# Patient Record
Sex: Male | Born: 1955 | Race: White | Hispanic: No | Marital: Married | Smoking: Former smoker
Health system: Southern US, Community
[De-identification: ages and names within clinical notes are randomized; demographics above are authoritative.]

## PROBLEM LIST (undated history)

## (undated) DIAGNOSIS — I639 Cerebral infarction, unspecified: Secondary | ICD-10-CM

## (undated) DIAGNOSIS — T8859XA Other complications of anesthesia, initial encounter: Secondary | ICD-10-CM

## (undated) DIAGNOSIS — Z9889 Other specified postprocedural states: Secondary | ICD-10-CM

## (undated) DIAGNOSIS — J9610 Chronic respiratory failure, unspecified whether with hypoxia or hypercapnia: Secondary | ICD-10-CM

## (undated) DIAGNOSIS — E119 Type 2 diabetes mellitus without complications: Secondary | ICD-10-CM

## (undated) DIAGNOSIS — I10 Essential (primary) hypertension: Secondary | ICD-10-CM

## (undated) DIAGNOSIS — R7881 Bacteremia: Secondary | ICD-10-CM

## (undated) DIAGNOSIS — L988 Other specified disorders of the skin and subcutaneous tissue: Secondary | ICD-10-CM

## (undated) DIAGNOSIS — I82890 Acute embolism and thrombosis of other specified veins: Secondary | ICD-10-CM

## (undated) DIAGNOSIS — R112 Nausea with vomiting, unspecified: Secondary | ICD-10-CM

## (undated) DIAGNOSIS — C801 Malignant (primary) neoplasm, unspecified: Secondary | ICD-10-CM

## (undated) DIAGNOSIS — T4145XA Adverse effect of unspecified anesthetic, initial encounter: Secondary | ICD-10-CM

## (undated) HISTORY — PX: OTHER SURGICAL HISTORY: SHX169

## (undated) HISTORY — PX: CERVICAL DISC SURGERY: SHX588

## (undated) HISTORY — DX: Essential (primary) hypertension: I10

## (undated) HISTORY — PX: CHOLECYSTECTOMY: SHX55

## (undated) HISTORY — DX: Type 2 diabetes mellitus without complications: E11.9

## (undated) HISTORY — PX: APPENDECTOMY: SHX54

## (undated) HISTORY — PX: KNEE ARTHROSCOPY: SUR90

---
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
## 2004-11-21 ENCOUNTER — Ambulatory Visit: Payer: Self-pay | Admitting: Internal Medicine

## 2005-08-13 ENCOUNTER — Ambulatory Visit (HOSPITAL_COMMUNITY): Admission: RE | Admit: 2005-08-13 | Discharge: 2005-08-14 | Payer: Self-pay | Admitting: Neurosurgery

## 2009-11-08 ENCOUNTER — Encounter (INDEPENDENT_AMBULATORY_CARE_PROVIDER_SITE_OTHER): Payer: Self-pay | Admitting: Neurosurgery

## 2009-11-08 ENCOUNTER — Ambulatory Visit (HOSPITAL_COMMUNITY): Admission: RE | Admit: 2009-11-08 | Discharge: 2009-11-08 | Payer: Self-pay | Admitting: Neurosurgery

## 2010-06-20 LAB — GLUCOSE, CAPILLARY: Glucose-Capillary: 188 mg/dL — ABNORMAL HIGH (ref 70–99)

## 2010-06-21 LAB — CBC
Hemoglobin: 15.6 g/dL (ref 13.0–17.0)
MCH: 28.6 pg (ref 26.0–34.0)
MCV: 83.7 fL (ref 78.0–100.0)
RBC: 5.47 MIL/uL (ref 4.22–5.81)
RDW: 12.6 % (ref 11.5–15.5)

## 2010-06-21 LAB — BASIC METABOLIC PANEL
Chloride: 97 mEq/L (ref 96–112)
GFR calc Af Amer: >60 mL/min (ref >60)
Glucose, Bld: 351 mg/dL — ABNORMAL HIGH (ref 70–99)
Potassium: 4.3 mEq/L (ref 3.5–5.1)
Sodium: 132 mEq/L — ABNORMAL LOW (ref 135–145)

## 2010-08-22 NOTE — Op Note (Signed)
[name], [name]            ACCOUNT NO.:  [id number]   MEDICAL RECORD NO.:  [id number]          PATIENT TYPE:  OIB   LOCATION:  3017                         FACILITY:  [facility]   PHYSICIAN:  Danae Orleans. Venetia Maxon, M.D.  DATE OF BIRTH:  1955-08-19   DATE OF PROCEDURE:  08/13/2005  DATE OF DISCHARGE:  08/14/2005                                 OPERATIVE REPORT   PREOPERATIVE DIAGNOSIS:  Herniated cervical disk with spondylosis, kyphosis,  degenerative disk disease and radiculopathy, C4-5 and C5-6 levels.   POSTOPERATIVE DIAGNOSIS:  Herniated cervical disk with spondylosis,  kyphosis, degenerative disk disease and radiculopathy, C4-5 and C5-6 levels.   OPERATION PERFORMED:  Anterior cervical decompression and fusion C4-5 and C5-  6 level with PEEK interbody cages, morselized bone autograft and anterior  cervical plate.   SURGEON:  Danae Orleans. Venetia Maxon, M.D.   ASSISTANT:  Stefani Dama, M.D.   ANESTHESIA:  General endotracheal.   ESTIMATED BLOOD LOSS:  Minimal.   COMPLICATIONS:  None.   DISPOSITION:  Recovery.   INDICATIONS FOR PROCEDURE:  Nathan Perkins is a 55 year old with a  herniated disk herniations and cervical spondylosis  with kyphosis at C4-5  and C5-6 levels and significant cervical radiculopathy.  It was elected to  take him to surgery for anterior cervical decompression and fusion at the C4-  5 and C5-6 level.   DESCRIPTION OF PROCEDURE:  Mr. Seiden was brought to the operating room.  Following satisfactory and uncomplicated induction of general endotracheal  anesthesia and placement of intravenous lines, the patient was placed in  supine position on the operating table.  The neck was placed in slight  extension and he was placed in 10 pounds of halter traction.  The anterior  neck was then prepped and draped in the usual sterile fashion.  The area of  planned incision was infiltrated with 0.25% Marcaine and 0.5% lidocaine  1:200,000 epinephrine.  Incision was  made from the midline to the anterior  border of the sternocleidomastoid muscle, carried sharply through the  platysma layer.  Subplatysmal dissection was performed exposing the anterior  border of the sternocleidomastoid muscle. Using blunt dissection, the  carotid sheath was kept lateral, the trachea and esophagus kept medial  exposing the anterior cervical spine.  A bent spinal needle was placed at  what was felt to be the C4-5 level.  Intraoperative x-ray confirmed this to  be the C4-5 level.  Subsequently, the longus colli muscles were taken down  from the anterior cervical spine from C4 through C6 bilaterally using  electrocautery and Key elevator.  Self-retaining Shadowline retractor was  placed to facilitate exposure.  Anterior bone spurs were then removed.  Interspaces at C4-5 and C5-6 were then incised with a 15 blade and disk  material was removed in piecemeal fashion.  End plates were stripped of  residual disk material and cartilaginous material using a variety of Carlens  curettes.  A disk space spreader was placed at the C4-5 level and the  microscope was brought into the field and using high powered microscopic  visualization the end plates were decorticated and the bone  spurs and also  uncinate spurs which were drilled down were retained and then later used in  bone grafting.  The spinal cord dura was decompressed.  The posterior  longitudinal ligament was removed in piecemeal fashion.  Significant  spondylitic narrowing at the C4-5 level on the left was removed with  resultant significant decompression of the nerve roots.  Hemostasis was  assured with Gelfoam soaked in Thrombin.  After trial sizing, a 7 mm  PEEK  interbody cage was selected, packed with morcellized bone autograft and  inserted in the interspace and countersunk appropriately.  Attention was  then turned to the C5-6 level where similar decompression was performed and  again uncinate spurs were drilled down  and retained for later use in bone  grafting and both the cervical spinal cord dura and both the C6 nerve roots  were widely decompressed.  Hemostasis was again assured and a similarly  sized graft was packed with morcellized bone autograft and inserted in the  interspace and countersunk appropriately.  The anterior cervical plate was  then affixed to the anterior cervical spine using 14 mm variable angle  screws, two at C4, two at C5, two at C6.  All screws had excellent purchase.  A 34 mm anterior cervical plate was utilized.  Hemostasis was assured.  Final X-ray confirmed well positioned interbody grafts and anterior cervical  plate.  At C4 and 5 the lower portion was not well visualized.  Halter  traction was removed prior to placing the plate and bone graft.  Subsequently the platysma layer was closed with 3-0 Vicryl sutures and skin  edges were reapproximated with 3-0 Vicryl interrupted inverted sutures.  The  wound was dressed with Dermabond.  The patient was extubated in the  operating room and taken to the recovery room in stable and satisfactory  condition having tolerated the operation well.  Counts were correct at the  end of the case.      Danae Orleans. Venetia Maxon, M.D.  Electronically Signed     JDS/MEDQ  D:  08/13/2005  T:  08/14/2005  Job:  [id number]

## 2013-01-12 ENCOUNTER — Encounter (INDEPENDENT_AMBULATORY_CARE_PROVIDER_SITE_OTHER): Payer: Self-pay | Admitting: *Deleted

## 2013-02-16 ENCOUNTER — Encounter (INDEPENDENT_AMBULATORY_CARE_PROVIDER_SITE_OTHER): Payer: Self-pay | Admitting: Internal Medicine

## 2013-02-16 ENCOUNTER — Other Ambulatory Visit (INDEPENDENT_AMBULATORY_CARE_PROVIDER_SITE_OTHER): Payer: Self-pay | Admitting: *Deleted

## 2013-02-16 ENCOUNTER — Telehealth (INDEPENDENT_AMBULATORY_CARE_PROVIDER_SITE_OTHER): Payer: Self-pay | Admitting: *Deleted

## 2013-02-16 ENCOUNTER — Ambulatory Visit (INDEPENDENT_AMBULATORY_CARE_PROVIDER_SITE_OTHER): Payer: BC Managed Care – PPO | Admitting: Internal Medicine

## 2013-02-16 VITALS — BP 118/82 | HR 80 | Temp 97.7°F | Ht 72.0 in | Wt 202.1 lb

## 2013-02-16 DIAGNOSIS — R195 Other fecal abnormalities: Secondary | ICD-10-CM | POA: Insufficient documentation

## 2013-02-16 DIAGNOSIS — K921 Melena: Secondary | ICD-10-CM

## 2013-02-16 MED ORDER — PEG-KCL-NACL-NASULF-NA ASC-C 100 G PO SOLR: 1.0000 | Freq: Once | ORAL | Status: DC

## 2013-02-16 NOTE — Patient Instructions (Signed)
Colonoscopy with Dr. Rehman. The risks and benefits such as perforation, bleeding, and infection were reviewed with the patient and is agreeable. 

## 2013-02-16 NOTE — Progress Notes (Addendum)
Subjective:     Patient ID: Nathan Perkins, male   DOB: September 26, 1955, 57 y.o.   MRN: [id number]  HPI Referred to our office by Dr. Sherryll Burger for heme positive stools card/colonoscopy. His last colonoscopy was 6 yrs ago for screening purposes. He reports it was normal. [facility] by Dr. Karilyn Cota. Appetite is good. No weight loss. No acid reflux with Omeprazole. No abdominal pain. He usually has a BM daily. No melena or bright red rectal bleeding. No NSAIDs. Does take an ASA 81mg  daily.   12/09/2012 H and h 15.6 AND 44.6 Married, retired. One adopted child in good health.   Review of Systems Current Outpatient Prescriptions  Medication Sig Dispense Refill  . aspirin 81 MG tablet Take 81 mg by mouth daily.      . cholecalciferol (VITAMIN D) 400 UNITS TABS tablet Take 400 Units by mouth.      . Cinnamon 500 MG capsule Take 500 mg by mouth 2 (two) times daily before a meal.      . Flaxseed, Linseed, (FLAXSEED OIL PO) Take by mouth.      . insulin detemir (LEVEMIR) 100 UNIT/ML injection Inject into the skin. 25 in am and 60 at night      . insulin lispro (HUMALOG) 100 UNIT/ML injection Inject into the skin 3 (three) times daily before meals.      [name] Kitchen lisinopril (PRINIVIL,ZESTRIL) 10 MG tablet Take 10 mg by mouth daily.      . metFORMIN (GLUMETZA) 500 MG (MOD) 24 hr tablet Take 500 mg by mouth daily with breakfast.      . omeprazole (PRILOSEC) 20 MG capsule Take 20 mg by mouth 2 (two) times daily before a meal.       No current facility-administered medications for this visit.   Past Medical History  Diagnosis Date  . Diabetes     type 2 x 5 yrs  . Hypertension     since age 87   Past Surgical History  Procedure Laterality Date  . Neck surgery      with a plate  . Appendectomy    . Knee arthroscopy    . Cyst removal of neck    . Cholecystectomy     Past Surgical History  Procedure Laterality Date  . Neck surgery      with a plate  . Appendectomy    . Knee arthroscopy    . Cyst removal of  neck    . Cholecystectomy         Objective:   Physical Exam  Filed Vitals:   02/16/13 1527  BP: 118/82  Pulse: 80  Temp: 97.7 F (36.5 C)  Height: 6' (1.829 m)  Weight: 202 lb 1.6 oz (91.672 kg)   Alert and oriented. Skin warm and dry. Oral mucosa is moist.   . Sclera anicteric, conjunctivae is pink. Thyroid not enlarged. No cervical lymphadenopathy. Lungs clear. Heart regular rate and rhythm.  Abdomen is soft. Bowel sounds are positive. No hepatomegaly. No abdominal masses felt. No tenderness.  No edema to lower extremities.       Assessment:    Heme positive card. Colonic neoplasm needs to be ruled out. Hemorroids, polyps also in the differential.     Plan:       Colonoscopy with Dr Karilyn Cota. The risks and benefits such as perforation, bleeding, and infection were reviewed with the patient and is agreeable.

## 2013-02-16 NOTE — Telephone Encounter (Signed)
Patient needs movi prep 

## 2013-03-07 ENCOUNTER — Encounter (HOSPITAL_COMMUNITY): Payer: Self-pay | Admitting: Pharmacy Technician

## 2013-03-15 ENCOUNTER — Encounter (HOSPITAL_COMMUNITY): Payer: Self-pay | Admitting: *Deleted

## 2013-03-15 ENCOUNTER — Encounter (HOSPITAL_COMMUNITY)
Admission: RE | Disposition: A | Discharge status: Home / Self Care | Payer: Self-pay | Source: Ambulatory Visit | Attending: Internal Medicine

## 2013-03-15 ENCOUNTER — Ambulatory Visit (HOSPITAL_COMMUNITY)
Admission: RE | Admit: 2013-03-15 | Discharge: 2013-03-15 | Disposition: A | Discharge status: Home / Self Care | Payer: BC Managed Care – PPO | Source: Ambulatory Visit | Attending: Internal Medicine | Admitting: Internal Medicine

## 2013-03-15 DIAGNOSIS — Z7982 Long term (current) use of aspirin: Secondary | ICD-10-CM

## 2013-03-15 DIAGNOSIS — I10 Essential (primary) hypertension: Secondary | ICD-10-CM | POA: Insufficient documentation

## 2013-03-15 DIAGNOSIS — R195 Other fecal abnormalities: Secondary | ICD-10-CM

## 2013-03-15 DIAGNOSIS — Z01812 Encounter for preprocedural laboratory examination: Secondary | ICD-10-CM | POA: Insufficient documentation

## 2013-03-15 DIAGNOSIS — K921 Melena: Secondary | ICD-10-CM | POA: Insufficient documentation

## 2013-03-15 DIAGNOSIS — E119 Type 2 diabetes mellitus without complications: Secondary | ICD-10-CM | POA: Insufficient documentation

## 2013-03-15 DIAGNOSIS — K644 Residual hemorrhoidal skin tags: Secondary | ICD-10-CM | POA: Insufficient documentation

## 2013-03-15 HISTORY — PX: COLONOSCOPY: SHX5424

## 2013-03-15 SURGERY — COLONOSCOPY
Anesthesia: Moderate Sedation

## 2013-03-15 MED ORDER — MEPERIDINE HCL 50 MG/ML IJ SOLN
INTRAMUSCULAR | Status: DC | PRN
  Administered 2013-03-15 (×2): 25 mg via INTRAVENOUS

## 2013-03-15 MED ORDER — MIDAZOLAM HCL 5 MG/5ML IJ SOLN
INTRAMUSCULAR | Status: AC
  Filled 2013-03-15: qty 10

## 2013-03-15 MED ORDER — MIDAZOLAM HCL 5 MG/5ML IJ SOLN
INTRAMUSCULAR | Status: DC | PRN
  Administered 2013-03-15 (×2): 2 mg via INTRAVENOUS

## 2013-03-15 MED ORDER — MEPERIDINE HCL 50 MG/ML IJ SOLN
INTRAMUSCULAR | Status: AC
  Filled 2013-03-15: qty 1

## 2013-03-15 MED ORDER — STERILE WATER FOR IRRIGATION IR SOLN
Status: DC | PRN
  Administered 2013-03-15: 15:00:00

## 2013-03-15 MED ORDER — SODIUM CHLORIDE 0.9 % IV SOLN
INTRAVENOUS | Status: DC
  Administered 2013-03-15: 14:00:00 via INTRAVENOUS

## 2013-03-15 NOTE — H&P (Addendum)
Nathan Perkins is an 57 y.o. male.   Chief Complaint: Patient is here for colonoscopy. HPI: This is 37 old Caucasian male who was found to have heme-positive stools on routine testing by his primary care physician Dr. Sherryll Burger. He denies abdominal pain change in his bowel habits rectal bleeding or melena. He is on low-dose aspirin. He does not take OTC NSAIDs. Patient's last colonoscopy was over 6 years ago. Family history is negative for CRC.  Past Medical History  Diagnosis Date  . Diabetes     type 2 x 5 yrs  . Hypertension     since age 64    Past Surgical History  Procedure Laterality Date  . Neck surgery      with a plate  . Appendectomy    . Knee arthroscopy    . Cyst removal of neck    . Cholecystectomy      History reviewed. No pertinent family history. Social History:  reports that he has quit smoking. He does not have any smokeless tobacco history on file. He reports that he does not drink alcohol or use illicit drugs.  Allergies:  Allergies  Allergen Reactions  . Shellfish Allergy   . Sulfa Antibiotics     He cannot tell me what kind of reaction he had    Medications Prior to Admission  Medication Sig Dispense Refill  . aspirin 81 MG tablet Take 81 mg by mouth daily.      . cholecalciferol (VITAMIN D) 400 UNITS TABS tablet Take 400 Units by mouth.      . Cinnamon 500 MG capsule Take 500 mg by mouth 2 (two) times daily before a meal.      . Flaxseed, Linseed, (FLAXSEED OIL PO) Take 1 tablet by mouth daily.       . insulin detemir (LEVEMIR) 100 UNIT/ML injection Inject into the skin. 25 in am and 60 at night      . insulin lispro (HUMALOG) 100 UNIT/ML injection Inject 3-14 Units into the skin 3 (three) times daily before meals. Based on sliding scale.      [name] Kitchen lisinopril (PRINIVIL,ZESTRIL) 10 MG tablet Take 10 mg by mouth daily.      . metFORMIN (GLUMETZA) 500 MG (MOD) 24 hr tablet Take 500 mg by mouth daily with breakfast.      . omeprazole (PRILOSEC) 20 MG  capsule Take 20 mg by mouth 2 (two) times daily before a meal.        No results found for this or any previous visit (from the past 48 hour(s)). No results found.  ROS  Blood pressure 154/111, pulse 88, temperature 97.9 F (36.6 C), temperature source Oral, resp. rate 18, height 6' (1.829 m), weight 202 lb (91.627 kg), SpO2 98.00%. Physical Exam  Constitutional: He appears well-developed and well-nourished.  HENT:  Mouth/Throat: Oropharynx is clear and moist.  Eyes: Conjunctivae are normal. No scleral icterus.  Neck: No thyromegaly present.  Cardiovascular: Normal rate, regular rhythm and normal heart sounds.   No murmur heard. Respiratory: Effort normal and breath sounds normal.  GI: Soft. He exhibits no distension and no mass. There is no tenderness.  Small umbilical hernia  Musculoskeletal: He exhibits no edema.  Neurological: He is alert.  Skin: Skin is warm and dry.     Assessment/Plan Heme-positive stool. Diagnostic colonoscopy.  Nathan Perkins 03/15/2013, 3:04 PM

## 2013-03-15 NOTE — OR Nursing (Signed)
Dr. Karilyn Cota notified of patient's BP 154/111, P-88. No orders received.

## 2013-03-15 NOTE — Op Note (Signed)
COLONOSCOPY PROCEDURE REPORT  PATIENT:  Nathan Perkins  MR#:  [id number] Birthdate:  [birth year], 57 y.o., male Endoscopist:  Dr. Malissa Hippo, MD Referred By:  Dr. Kirstie Peri, MD  Procedure Date: 03/15/2013  Procedure:   Colonoscopy  Indications: Patient is 2 old Caucasian male who was found to have heme positive stool on routine testing. He has no GI symptoms. He is on low-dose aspirin. His last colonoscopy was over 6 years ago.  Informed Consent:  The procedure and risks were reviewed with the patient and informed consent was obtained.  Medications:  Demerol 50 mg IV Versed 4 mg IV  Description of procedure:  After a digital rectal exam was performed, that colonoscope was advanced from the anus through the rectum and colon to the area of the cecum, ileocecal valve and appendiceal orifice. The cecum was deeply intubated. These structures were well-seen and photographed for the record. From the level of the cecum and ileocecal valve, the scope was slowly and cautiously withdrawn. The mucosal surfaces were carefully surveyed utilizing scope tip to flexion to facilitate fold flattening as needed. The scope was pulled down into the rectum where a thorough exam including retroflexion was performed. Terminal ileum was also examined.  Findings:   Prep excellent. Normal mucosa of terminal ileum. Normal mucosa of various segments of colon and rectum. Small hemorrhoids below the dentate line.    Therapeutic/Diagnostic Maneuvers Performed:   None  Complications:  None  Cecal Withdrawal Time:  10 minutes  Impression:  Normal mucosa of terminal ileum. Normal colonoscopy except small external hemorrhoids.  Recommendations:  Standard instructions given. He will have CBC and Hemoccults repeated in 4 months Dr. Sherryll Burger. Next screening exam in 10 years.  Zimal Weisensel U  03/15/2013 3:35 PM  CC: Dr. Kirstie Peri, MD & Dr. Bonnetta Barry ref. provider found

## 2013-03-16 ENCOUNTER — Encounter (INDEPENDENT_AMBULATORY_CARE_PROVIDER_SITE_OTHER): Payer: Self-pay

## 2013-03-16 LAB — GLUCOSE, CAPILLARY: Glucose-Capillary: 175 mg/dL — ABNORMAL HIGH (ref 70–99)

## 2013-03-20 ENCOUNTER — Encounter (HOSPITAL_COMMUNITY): Payer: Self-pay | Admitting: Internal Medicine

## 2014-03-13 ENCOUNTER — Other Ambulatory Visit: Payer: Self-pay | Admitting: Dermatology

## 2014-05-10 ENCOUNTER — Other Ambulatory Visit: Payer: Self-pay | Admitting: Dermatology

## 2015-01-03 ENCOUNTER — Encounter: Payer: Self-pay | Admitting: Cardiovascular Disease

## 2015-01-03 ENCOUNTER — Ambulatory Visit (INDEPENDENT_AMBULATORY_CARE_PROVIDER_SITE_OTHER): Payer: BLUE CROSS/BLUE SHIELD | Admitting: Cardiovascular Disease

## 2015-01-03 ENCOUNTER — Other Ambulatory Visit: Payer: Self-pay | Admitting: Cardiovascular Disease

## 2015-01-03 VITALS — BP 138/88 | HR 90

## 2015-01-03 DIAGNOSIS — R079 Chest pain, unspecified: Secondary | ICD-10-CM | POA: Diagnosis not present

## 2015-01-03 DIAGNOSIS — Z87898 Personal history of other specified conditions: Secondary | ICD-10-CM

## 2015-01-03 DIAGNOSIS — R931 Abnormal findings on diagnostic imaging of heart and coronary circulation: Secondary | ICD-10-CM

## 2015-01-03 DIAGNOSIS — R9431 Abnormal electrocardiogram [ECG] [EKG]: Secondary | ICD-10-CM

## 2015-01-03 DIAGNOSIS — R9439 Abnormal result of other cardiovascular function study: Secondary | ICD-10-CM

## 2015-01-03 DIAGNOSIS — R0609 Other forms of dyspnea: Secondary | ICD-10-CM

## 2015-01-03 DIAGNOSIS — Z9289 Personal history of other medical treatment: Secondary | ICD-10-CM

## 2015-01-03 NOTE — Patient Instructions (Signed)
Your physician recommends that you schedule a follow-up appointment in: [facility]     Your physician has requested that you have a cardiac catheterization. Cardiac catheterization is used to diagnose and/or treat various heart conditions. Doctors may recommend this procedure for a number of different reasons. The most common reason is to evaluate chest pain. Chest pain can be a symptom of coronary artery disease (CAD), and cardiac catheterization can show whether plaque is narrowing or blocking your heart's arteries. This procedure is also used to evaluate the valves, as well as measure the blood flow and oxygen levels in different parts of your heart. For further information please visit [email or web address]. Please follow instruction sheet, as given.  HOLD Insulin and Metformin as directed on heart catherization instructions      Thank you for choosing [facility] !

## 2015-01-03 NOTE — Progress Notes (Signed)
Patient ID: Nathan Perkins, male   DOB: February 06, 1956, 59 y.o.   MRN: [id number]       CARDIOLOGY CONSULT NOTE  Patient ID: Nathan Perkins MRN: [id number] DOB/AGE: 13-Sep-1955 59 y.o.  Admit date: (Not on file) Primary Physician Monico Blitz, MD  Reason for Consultation: abnormal stress test  HPI: The patient is a 59 year old male with a history of diabetes mellitus and essential hypertension who is referred today for an abnormal cardiac stress test.   He underwent an exercise Cardiolite stress test on 12/28/14. It appears he exercised for 7 minutes and 6 seconds utilizing a standard Bruce protocol achieving stage III and 10.1 METS. The ST segment response to stress was reportedly normal. There were no documented arrhythmias. It was interpreted as an abnormal study with a small stress induced septal defect with normal left ventricular systolic function, LVEF 17%.  He tells me he was hospitalized at [facility] for chest pain and he ruled out for an acute coronary syndrome. He had woken up early in the morning with severe retrosternal chest pain. He has also been experiencing exertional dyspnea when climbing a flight of stairs which he did not experience 3 months ago. His wife is present and is very concerned. He does have gastroesophageal reflux disease and had been on Nexium and now takes omeprazole due to insurance coverage reasons. He denies orthopnea, paroxysmal nocturnal dyspnea, and leg swelling.   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 He said he needs to have this issue resolved in order to have his license renewed this upcoming March.  ECG performed in the office today demonstrates normal sinus rhythm with no ischemic ST segment or T-wave abnormalities, nor any arrhythmias.  Fam: His father had a pacemaker and his brother who is one year younger than him had CABG and is also being scheduled for pacemaker.   Allergies  Allergen Reactions  .  Shellfish Allergy   . Sulfa Antibiotics     He cannot tell me what kind of reaction he had    Current Outpatient Prescriptions  Medication Sig Dispense Refill  . aspirin 81 MG tablet Take 81 mg by mouth daily.    . cholecalciferol (VITAMIN D) 400 UNITS TABS tablet Take 400 Units by mouth.    . Cinnamon 500 MG capsule Take 500 mg by mouth 2 (two) times daily before a meal.    . Flaxseed, Linseed, (FLAXSEED OIL PO) Take 1 tablet by mouth daily.     . insulin lispro (HUMALOG) 100 UNIT/ML injection Inject 3-14 Units into the skin 3 (three) times daily before meals. Based on sliding scale.    . metFORMIN (GLUMETZA) 500 MG (MOD) 24 hr tablet Take 500 mg by mouth daily with breakfast.    . NOVOLIN R RELION 100 UNIT/ML injection Inject into the skin. Sliding scale  0  . omeprazole (PRILOSEC) 20 MG capsule Take 20 mg by mouth 2 (two) times daily before a meal.    . lisinopril (PRINIVIL,ZESTRIL) 20 MG tablet Take 30 mg by mouth daily.   1   No current facility-administered medications for this visit.    Past Medical History  Diagnosis Date  . Diabetes     type 2 x 5 yrs  . Hypertension     since age 57    Past Surgical History  Procedure Laterality Date  . Neck surgery      with a plate  . Appendectomy    .  Knee arthroscopy    . Cyst removal of neck    . Cholecystectomy    . Colonoscopy N/A 03/15/2013    Procedure: COLONOSCOPY;  Surgeon: Rogene Houston, MD;  Location: [facility];  Service: Endoscopy;  Laterality: N/A;  67    Social History   Social History  . Marital Status: Married    Spouse Name: N/A  . Number of Children: N/A  . Years of Education: N/A   Occupational History  . Not on file.   Social History Main Topics  . Smoking status: Former Smoker -- 2.00 packs/day    Types: Cigarettes    Start date: 01/02/1969    Quit date: 07/03/1999  . Smokeless tobacco: Former Systems developer    Types: Chew     Comment: quit 13 yrs ago  . Alcohol Use: No  . Drug Use: No  .  Sexual Activity: Not on file   Other Topics Concern  . Not on file   Social History Narrative       Prior to Admission medications   Medication Sig Start Date End Date Taking? Authorizing Provider  aspirin 81 MG tablet Take 81 mg by mouth daily.    Historical Provider, MD  cholecalciferol (VITAMIN D) 400 UNITS TABS tablet Take 400 Units by mouth.    Historical Provider, MD  Cinnamon 500 MG capsule Take 500 mg by mouth 2 (two) times daily before a meal.    Historical Provider, MD  Flaxseed, Linseed, (FLAXSEED OIL PO) Take 1 tablet by mouth daily.     Historical Provider, MD  insulin lispro (HUMALOG) 100 UNIT/ML injection Inject 3-14 Units into the skin 3 (three) times daily before meals. Based on sliding scale.    Historical Provider, MD  lisinopril (PRINIVIL,ZESTRIL) 10 MG tablet Take 10 mg by mouth daily.    Historical Provider, MD  metFORMIN (GLUMETZA) 500 MG (MOD) 24 hr tablet Take 500 mg by mouth daily with breakfast.    Historical Provider, MD  omeprazole (PRILOSEC) 20 MG capsule Take 20 mg by mouth 2 (two) times daily before a meal.    Historical Provider, MD     Review of systems complete and found to be negative unless listed above in HPI     Physical exam Blood pressure 138/88, pulse 90, SpO2 98 %. General: NAD Neck: No JVD, no thyromegaly or thyroid nodule.  Lungs: Clear to auscultation bilaterally with normal respiratory effort. CV: Nondisplaced PMI. Regular rate and rhythm, normal S1/S2, no S3/S4, no murmur.  No peripheral edema.  No carotid bruit.  Normal pedal pulses.  Abdomen: Soft, nontender, no hepatosplenomegaly, no distention.  Skin: Intact without lesions or rashes.  Neurologic: Alert and oriented x 3.  Psych: Normal affect. Extremities: No clubbing or cyanosis.  HEENT: Normal.   ECG: Most recent ECG reviewed.  Labs:   Lab Results  Component Value Date   WBC 8.0 11/01/2009   HGB 15.6 11/01/2009   HCT 45.8 11/01/2009   MCV 83.7 11/01/2009   PLT  233 11/01/2009   No results for input(s): NA, K, CL, CO2, BUN, CREATININE, CALCIUM, PROT, BILITOT, ALKPHOS, ALT, AST, GLUCOSE in the last 168 hours.  Invalid input(s): LABALBU No results found for: CKTOTAL, CKMB, CKMBINDEX, TROPONINI No results found for: CHOL No results found for: HDL No results found for: LDLCALC No results found for: TRIG No results found for: CHOLHDL No results found for: LDLDIRECT       Studies: No results found.  ASSESSMENT AND PLAN:  1. Chest pain, exertional dyspnea, in the context of an abnormal nuclear stress test: It appears to have been a low-risk study based on hemodynamic, electrocardiographic, and nuclear parameters. We had a very lengthy discussion regarding management options. I talked to him about purusing an optimal medical therapy strategy. 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 CV risk factors include diabetes, hypertension, and family history. For this reason, will proceed with coronary angiography.  2. Essential HTN: Reasonably controlled but would aim for <130/80. Will continue to monitor.  Dispo: f/u after cath.   Signed: Kate Sable, M.D., F.A.C.C.  01/03/2015, 3:28 PM

## 2015-01-07 ENCOUNTER — Encounter (HOSPITAL_COMMUNITY)
Admission: RE | Disposition: A | Discharge status: Home / Self Care | Payer: Self-pay | Source: Ambulatory Visit | Attending: Cardiovascular Disease

## 2015-01-07 ENCOUNTER — Ambulatory Visit (HOSPITAL_COMMUNITY)
Admission: RE | Admit: 2015-01-07 | Discharge: 2015-01-07 | Disposition: A | Discharge status: Home / Self Care | Payer: BLUE CROSS/BLUE SHIELD | Source: Ambulatory Visit | Attending: Cardiovascular Disease | Admitting: Cardiovascular Disease

## 2015-01-07 ENCOUNTER — Encounter (HOSPITAL_COMMUNITY): Payer: Self-pay | Admitting: Cardiovascular Disease

## 2015-01-07 DIAGNOSIS — R0609 Other forms of dyspnea: Secondary | ICD-10-CM

## 2015-01-07 DIAGNOSIS — Z7984 Long term (current) use of oral hypoglycemic drugs: Secondary | ICD-10-CM | POA: Insufficient documentation

## 2015-01-07 DIAGNOSIS — Z882 Allergy status to sulfonamides status: Secondary | ICD-10-CM | POA: Insufficient documentation

## 2015-01-07 DIAGNOSIS — E119 Type 2 diabetes mellitus without complications: Secondary | ICD-10-CM | POA: Insufficient documentation

## 2015-01-07 DIAGNOSIS — Z794 Long term (current) use of insulin: Secondary | ICD-10-CM | POA: Insufficient documentation

## 2015-01-07 DIAGNOSIS — Z7982 Long term (current) use of aspirin: Secondary | ICD-10-CM | POA: Diagnosis not present

## 2015-01-07 DIAGNOSIS — I10 Essential (primary) hypertension: Secondary | ICD-10-CM | POA: Diagnosis not present

## 2015-01-07 DIAGNOSIS — K219 Gastro-esophageal reflux disease without esophagitis: Secondary | ICD-10-CM | POA: Diagnosis not present

## 2015-01-07 DIAGNOSIS — R072 Precordial pain: Secondary | ICD-10-CM | POA: Diagnosis present

## 2015-01-07 DIAGNOSIS — I25110 Atherosclerotic heart disease of native coronary artery with unstable angina pectoris: Secondary | ICD-10-CM | POA: Insufficient documentation

## 2015-01-07 DIAGNOSIS — R931 Abnormal findings on diagnostic imaging of heart and coronary circulation: Secondary | ICD-10-CM

## 2015-01-07 DIAGNOSIS — R079 Chest pain, unspecified: Secondary | ICD-10-CM | POA: Insufficient documentation

## 2015-01-07 DIAGNOSIS — I2510 Atherosclerotic heart disease of native coronary artery without angina pectoris: Secondary | ICD-10-CM

## 2015-01-07 DIAGNOSIS — Z87891 Personal history of nicotine dependence: Secondary | ICD-10-CM | POA: Diagnosis not present

## 2015-01-07 HISTORY — PX: CARDIAC CATHETERIZATION: SHX172

## 2015-01-07 LAB — CBC
HCT: 43.3 % (ref 39.0–52.0)
Hemoglobin: 14.7 g/dL (ref 13.0–17.0)
MCH: 27.7 pg (ref 26.0–34.0)
MCHC: 33.9 g/dL (ref 30.0–36.0)
MCV: 81.7 fL (ref 78.0–100.0)
PLATELETS: 268 10*3/uL (ref 150–400)
RBC: 5.3 MIL/uL (ref 4.22–5.81)
RDW: 12.9 % (ref 11.5–15.5)
WBC: 6.8 10*3/uL (ref 4.0–10.5)

## 2015-01-07 LAB — BASIC METABOLIC PANEL
Anion gap: 8 (ref 5–15)
BUN: 11 mg/dL (ref 6–20)
CALCIUM: 9.3 mg/dL (ref 8.9–10.3)
CO2: 24 mmol/L (ref 22–32)
CREATININE: 0.96 mg/dL (ref 0.61–1.24)
Chloride: 100 mmol/L — ABNORMAL LOW (ref 101–111)
GFR calc non Af Amer: >60 mL/min (ref >60)
Glucose, Bld: 323 mg/dL — ABNORMAL HIGH (ref 65–99)
Potassium: 4.2 mmol/L (ref 3.5–5.1)
Sodium: 132 mmol/L — ABNORMAL LOW (ref 135–145)

## 2015-01-07 LAB — GLUCOSE, CAPILLARY: GLUCOSE-CAPILLARY: 298 mg/dL — AB (ref 65–99)

## 2015-01-07 LAB — PROTIME-INR
INR: 0.94 (ref 0.00–1.49)
PROTHROMBIN TIME: 12.8 s (ref 11.6–15.2)

## 2015-01-07 SURGERY — LEFT HEART CATH AND CORONARY ANGIOGRAPHY

## 2015-01-07 MED ORDER — SODIUM CHLORIDE 0.9 % IV SOLN: 250.0000 mL | INTRAVENOUS | Status: DC | PRN

## 2015-01-07 MED ORDER — SODIUM CHLORIDE 0.9 % IJ SOLN: 3.0000 mL | INTRAMUSCULAR | Status: DC | PRN

## 2015-01-07 MED ORDER — VERAPAMIL HCL 2.5 MG/ML IV SOLN
INTRAVENOUS | Status: AC
  Filled 2015-01-07: qty 2

## 2015-01-07 MED ORDER — SODIUM CHLORIDE 0.9 % IJ SOLN: 3.0000 mL | Freq: Two times a day (BID) | INTRAMUSCULAR | Status: DC

## 2015-01-07 MED ORDER — HEPARIN SODIUM (PORCINE) 1000 UNIT/ML IJ SOLN
INTRAMUSCULAR | Status: AC
  Filled 2015-01-07: qty 1

## 2015-01-07 MED ORDER — FENTANYL CITRATE (PF) 100 MCG/2ML IJ SOLN
INTRAMUSCULAR | Status: AC
  Filled 2015-01-07: qty 4

## 2015-01-07 MED ORDER — SODIUM CHLORIDE 0.9 % IV SOLN: INTRAVENOUS | Status: AC

## 2015-01-07 MED ORDER — ASPIRIN 81 MG PO CHEW: 81.0000 mg | CHEWABLE_TABLET | ORAL | Status: DC

## 2015-01-07 MED ORDER — LIDOCAINE HCL (PF) 1 % IJ SOLN
INTRAMUSCULAR | Status: DC | PRN
  Administered 2015-01-07: 10:00:00

## 2015-01-07 MED ORDER — MIDAZOLAM HCL 2 MG/2ML IJ SOLN
INTRAMUSCULAR | Status: AC
  Filled 2015-01-07: qty 4

## 2015-01-07 MED ORDER — HEPARIN SODIUM (PORCINE) 1000 UNIT/ML IJ SOLN
INTRAMUSCULAR | Status: DC | PRN
  Administered 2015-01-07: 5000 [IU] via INTRAVENOUS

## 2015-01-07 MED ORDER — IOHEXOL 350 MG/ML SOLN
INTRAVENOUS | Status: DC | PRN
  Administered 2015-01-07: 90 mL via INTRACARDIAC

## 2015-01-07 MED ORDER — SODIUM CHLORIDE 0.9 % IV SOLN
INTRAVENOUS | Status: DC
  Administered 2015-01-07: 08:00:00 via INTRAVENOUS

## 2015-01-07 MED ORDER — MIDAZOLAM HCL 2 MG/2ML IJ SOLN
INTRAMUSCULAR | Status: DC | PRN
  Administered 2015-01-07: 2 mg via INTRAVENOUS

## 2015-01-07 MED ORDER — FENTANYL CITRATE (PF) 100 MCG/2ML IJ SOLN
INTRAMUSCULAR | Status: DC | PRN
  Administered 2015-01-07: 50 ug via INTRAVENOUS

## 2015-01-07 MED ORDER — LIDOCAINE HCL (PF) 1 % IJ SOLN
INTRAMUSCULAR | Status: AC
  Filled 2015-01-07: qty 30

## 2015-01-07 MED ORDER — VERAPAMIL HCL 2.5 MG/ML IV SOLN
INTRAVENOUS | Status: DC | PRN
  Administered 2015-01-07: 09:00:00 via INTRA_ARTERIAL

## 2015-01-07 MED ORDER — DIPHENHYDRAMINE HCL 50 MG/ML IJ SOLN
INTRAMUSCULAR | Status: AC
  Filled 2015-01-07: qty 1

## 2015-01-07 MED ORDER — HEPARIN (PORCINE) IN NACL 2-0.9 UNIT/ML-% IJ SOLN
INTRAMUSCULAR | Status: AC
  Filled 2015-01-07: qty 1000

## 2015-01-07 MED ORDER — DIPHENHYDRAMINE HCL 50 MG/ML IJ SOLN
25.0000 mg | INTRAMUSCULAR | Status: AC
  Administered 2015-01-07: 25 mg via INTRAVENOUS

## 2015-01-07 SURGICAL SUPPLY — 11 items

## 2015-01-07 NOTE — H&P (View-Only) (Signed)
Patient ID: Nathan Perkins, male   DOB: 1956/02/05, 59 y.o.   MRN: [id number]       CARDIOLOGY CONSULT NOTE  Patient ID: Nathan Perkins MRN: [id number] DOB/AGE: [birth year] 59 y.o.  Admit date: (Not on file) Primary Physician Monico Blitz, MD  Reason for Consultation: abnormal stress test  HPI: The patient is a 59 year old male with a history of diabetes mellitus and essential hypertension who is referred today for an abnormal cardiac stress test.   He underwent an exercise Cardiolite stress test on 12/28/14. It appears he exercised for 7 minutes and 6 seconds utilizing a standard Bruce protocol achieving stage III and 10.1 METS. The ST segment response to stress was reportedly normal. There were no documented arrhythmias. It was interpreted as an abnormal study with a small stress induced septal defect with normal left ventricular systolic function, LVEF 60%.  He tells me he was hospitalized at [facility] for chest pain and he ruled out for an acute coronary syndrome. He had woken up early in the morning with severe retrosternal chest pain. He has also been experiencing exertional dyspnea when climbing a flight of stairs which he did not experience 3 months ago. His wife is present and is very concerned. He does have gastroesophageal reflux disease and had been on Nexium and now takes omeprazole due to insurance coverage reasons. He denies orthopnea, paroxysmal nocturnal dyspnea, and leg swelling.   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 He said he needs to have this issue resolved in order to have his license renewed this upcoming March.  ECG performed in the office today demonstrates normal sinus rhythm with no ischemic ST segment or T-wave abnormalities, nor any arrhythmias.  Fam: His father had a pacemaker and his brother who is one year younger than him had CABG and is also being scheduled for pacemaker.   Allergies  Allergen Reactions  .  Shellfish Allergy   . Sulfa Antibiotics     He cannot tell me what kind of reaction he had    Current Outpatient Prescriptions  Medication Sig Dispense Refill  . aspirin 81 MG tablet Take 81 mg by mouth daily.    . cholecalciferol (VITAMIN D) 400 UNITS TABS tablet Take 400 Units by mouth.    . Cinnamon 500 MG capsule Take 500 mg by mouth 2 (two) times daily before a meal.    . Flaxseed, Linseed, (FLAXSEED OIL PO) Take 1 tablet by mouth daily.     . insulin lispro (HUMALOG) 100 UNIT/ML injection Inject 3-14 Units into the skin 3 (three) times daily before meals. Based on sliding scale.    . metFORMIN (GLUMETZA) 500 MG (MOD) 24 hr tablet Take 500 mg by mouth daily with breakfast.    . NOVOLIN R RELION 100 UNIT/ML injection Inject into the skin. Sliding scale  0  . omeprazole (PRILOSEC) 20 MG capsule Take 20 mg by mouth 2 (two) times daily before a meal.    . lisinopril (PRINIVIL,ZESTRIL) 20 MG tablet Take 30 mg by mouth daily.   1   No current facility-administered medications for this visit.    Past Medical History  Diagnosis Date  . Diabetes     type 2 x 5 yrs  . Hypertension     since age 59    Past Surgical History  Procedure Laterality Date  . Neck surgery      with a plate  . Appendectomy    .  Knee arthroscopy    . Cyst removal of neck    . Cholecystectomy    . Colonoscopy N/A 03/15/2013    Procedure: COLONOSCOPY;  Surgeon: Rogene Houston, MD;  Location: [facility];  Service: Endoscopy;  Laterality: N/A;  17    Social History   Social History  . Marital Status: Married    Spouse Name: N/A  . Number of Children: N/A  . Years of Education: N/A   Occupational History  . Not on file.   Social History Main Topics  . Smoking status: Former Smoker -- 2.00 packs/day    Types: Cigarettes    Start date: 01/02/1969    Quit date: 07/03/1999  . Smokeless tobacco: Former Systems developer    Types: Chew     Comment: quit 13 yrs ago  . Alcohol Use: No  . Drug Use: No  .  Sexual Activity: Not on file   Other Topics Concern  . Not on file   Social History Narrative       Prior to Admission medications   Medication Sig Start Date End Date Taking? Authorizing Provider  aspirin 81 MG tablet Take 81 mg by mouth daily.    Historical Provider, MD  cholecalciferol (VITAMIN D) 400 UNITS TABS tablet Take 400 Units by mouth.    Historical Provider, MD  Cinnamon 500 MG capsule Take 500 mg by mouth 2 (two) times daily before a meal.    Historical Provider, MD  Flaxseed, Linseed, (FLAXSEED OIL PO) Take 1 tablet by mouth daily.     Historical Provider, MD  insulin lispro (HUMALOG) 100 UNIT/ML injection Inject 3-14 Units into the skin 3 (three) times daily before meals. Based on sliding scale.    Historical Provider, MD  lisinopril (PRINIVIL,ZESTRIL) 10 MG tablet Take 10 mg by mouth daily.    Historical Provider, MD  metFORMIN (GLUMETZA) 500 MG (MOD) 24 hr tablet Take 500 mg by mouth daily with breakfast.    Historical Provider, MD  omeprazole (PRILOSEC) 20 MG capsule Take 20 mg by mouth 2 (two) times daily before a meal.    Historical Provider, MD     Review of systems complete and found to be negative unless listed above in HPI     Physical exam Blood pressure 138/88, pulse 90, SpO2 98 %. General: NAD Neck: No JVD, no thyromegaly or thyroid nodule.  Lungs: Clear to auscultation bilaterally with normal respiratory effort. CV: Nondisplaced PMI. Regular rate and rhythm, normal S1/S2, no S3/S4, no murmur.  No peripheral edema.  No carotid bruit.  Normal pedal pulses.  Abdomen: Soft, nontender, no hepatosplenomegaly, no distention.  Skin: Intact without lesions or rashes.  Neurologic: Alert and oriented x 3.  Psych: Normal affect. Extremities: No clubbing or cyanosis.  HEENT: Normal.   ECG: Most recent ECG reviewed.  Labs:   Lab Results  Component Value Date   WBC 8.0 11/01/2009   HGB 15.6 11/01/2009   HCT 45.8 11/01/2009   MCV 83.7 11/01/2009   PLT  233 11/01/2009   No results for input(s): NA, K, CL, CO2, BUN, CREATININE, CALCIUM, PROT, BILITOT, ALKPHOS, ALT, AST, GLUCOSE in the last 168 hours.  Invalid input(s): LABALBU No results found for: CKTOTAL, CKMB, CKMBINDEX, TROPONINI No results found for: CHOL No results found for: HDL No results found for: LDLCALC No results found for: TRIG No results found for: CHOLHDL No results found for: LDLDIRECT       Studies: No results found.  ASSESSMENT AND PLAN:  1. Chest pain, exertional dyspnea, in the context of an abnormal nuclear stress test: It appears to have been a low-risk study based on hemodynamic, electrocardiographic, and nuclear parameters. We had a very lengthy discussion regarding management options. I talked to him about purusing an optimal medical therapy strategy. 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 CV risk factors include diabetes, hypertension, and family history. For this reason, will proceed with coronary angiography.  2. Essential HTN: Reasonably controlled but would aim for <130/80. Will continue to monitor.  Dispo: f/u after cath.   Signed: Kate Sable, M.D., F.A.C.C.  01/03/2015, 3:28 PM

## 2015-01-07 NOTE — Discharge Instructions (Signed)
Radial Site Care Refer to this sheet in the next few weeks. These instructions provide you with information on caring for yourself after your procedure. Your caregiver may also give you more specific instructions. Your treatment has been planned according to current medical practices, but problems sometimes occur. Call your caregiver if you have any problems or questions after your procedure. HOME CARE INSTRUCTIONS  You may shower the day after the procedure.Remove the bandage (dressing) and gently wash the site with plain soap and water.Gently pat the site dry.  Do not apply powder or lotion to the site.  Do not submerge the affected site in water for 3 to 5 days.  Inspect the site at least twice daily.  Do not flex or bend the affected arm for 24 hours.  No lifting over 5 pounds (2.3 kg) for 5 days after your procedure.  Do not drive home if you are discharged the same day of the procedure. Have someone else drive you.  You may drive 24 hours after the procedure unless otherwise instructed by your caregiver.  Do not operate machinery or power tools for 24 hours.  A responsible adult should be with you for the first 24 hours after you arrive home. What to expect:  Any bruising will usually fade within 1 to 2 weeks.  Blood that collects in the tissue (hematoma) may be painful to the touch. It should usually decrease in size and tenderness within 1 to 2 weeks. SEEK IMMEDIATE MEDICAL CARE IF:  You have unusual pain at the radial site.  You have redness, warmth, swelling, or pain at the radial site.  You have drainage (other than a small amount of blood on the dressing).  You have chills.  You have a fever or persistent symptoms for more than 72 hours.  You have a fever and your symptoms suddenly get worse.  Your arm becomes pale, cool, tingly, or numb.  You have heavy bleeding from the site. Hold pressure on the site. Document Released: 04/25/2010 Document Revised:  06/15/2011 Document Reviewed: 04/25/2010 [facility] Patient Information 2015 [place], [place]. This information is not intended to replace advice given to you by your health care provider. Make sure you discuss any questions you have with your health care provider.                                                                           Return To Work ___________________[name]_______________________________ was treated at our facility on 01/07/15. INJURY OR ILLNESS WAS: _____ Work-related __x___ Not work-related _____ Undetermined if work-related RETURN TO WORK  Employee may return to work with no restrictions on: ________10/09/16____________  Employee may return to modified work on: _______10/05/16_____________ [place] Work activities not tolerated include: _____ Bending _____ Prolonged sitting __x___ Lifting more than 5 lbs _____ Squatting _____ Prolonged standing _____ Nathan Perkins _____ Reaching _____ Pushing and pulling _____ Walking _____ Other ____________________ Show this Return to Work statement to Optician, dispensing at work as soon as possible. Your employer should be aware of your condition and can help with the necessary work activity restrictions. If you wish to return to work sooner than the date above, or if you have further problems which make it difficult  for you to return at that time, please call us or your caregiver. _______________Dr. Christopher_McAlhany_________________________ Physician Name (Printed)   _________________________________________ Physician Signature    __________10/03/16_______________________________ Date Document Released: 03/23/2005 Document Revised: 06/15/2011 Document Reviewed: 09/07/2006 [employer] Patient Information 2015 [place], [place]. This information is not intended to replace advice given to you by your health care provider. Make sure  you discuss any questions you have with your health care provider.

## 2015-01-07 NOTE — Interval H&P Note (Signed)
History and Physical Interval Note:  01/07/2015 8:49 AM  Nathan Perkins  has presented today for cardiac cath with the diagnosis of unstable angina, abnormal nuclear stress test.  The various methods of treatment have been discussed with the patient and family. After consideration of risks, benefits and other options for treatment, the patient has consented to  Procedure(s): Left Heart Cath and Coronary Angiography (N/A) as a surgical intervention .  The patient's history has been reviewed, patient examined, no change in status, stable for surgery.  I have reviewed the patient's chart and labs.  Questions were answered to the patient's satisfaction.    Cath Lab Visit (complete for each Cath Lab visit)  Clinical Evaluation Leading to the Procedure:   ACS: No.  Non-ACS:    Anginal Classification: CCS III  Anti-ischemic medical therapy: Minimal Therapy (1 class of medications)  Non-Invasive Test Results: Low-risk stress test findings: cardiac mortality <1%/year  Prior CABG: No previous CABG         MCALHANY,CHRISTOPHER

## 2017-06-25 DIAGNOSIS — R112 Nausea with vomiting, unspecified: Secondary | ICD-10-CM

## 2017-06-25 DIAGNOSIS — Z9889 Other specified postprocedural states: Secondary | ICD-10-CM

## 2017-06-25 HISTORY — PX: LUMBAR DISC SURGERY: SHX700

## 2017-06-25 HISTORY — DX: Nausea with vomiting, unspecified: R11.2

## 2017-06-25 HISTORY — DX: Other specified postprocedural states: Z98.890

## 2017-07-27 ENCOUNTER — Ambulatory Visit (HOSPITAL_COMMUNITY)
Admission: RE | Admit: 2017-07-27 | Discharge: 2017-07-27 | Disposition: A | Discharge status: Home / Self Care | Payer: Commercial Managed Care - PPO | Source: Ambulatory Visit | Attending: Neurosurgery | Admitting: Neurosurgery

## 2017-07-27 ENCOUNTER — Other Ambulatory Visit (HOSPITAL_COMMUNITY): Payer: Self-pay | Admitting: Neurosurgery

## 2017-07-27 ENCOUNTER — Other Ambulatory Visit: Payer: Self-pay | Admitting: Neurosurgery

## 2017-07-27 DIAGNOSIS — M5416 Radiculopathy, lumbar region: Secondary | ICD-10-CM | POA: Insufficient documentation

## 2017-07-27 DIAGNOSIS — M5127 Other intervertebral disc displacement, lumbosacral region: Secondary | ICD-10-CM | POA: Insufficient documentation

## 2017-07-27 LAB — CREATININE, SERUM: CREATININE: 0.95 mg/dL (ref 0.61–1.24)

## 2017-07-27 MED ORDER — GADOBENATE DIMEGLUMINE 529 MG/ML IV SOLN
20.0000 mL | Freq: Once | INTRAVENOUS | Status: AC
  Administered 2017-07-27: 20 mL via INTRAVENOUS

## 2017-07-29 ENCOUNTER — Other Ambulatory Visit: Payer: Self-pay

## 2017-07-29 ENCOUNTER — Encounter (HOSPITAL_COMMUNITY): Payer: Self-pay | Admitting: *Deleted

## 2017-07-29 ENCOUNTER — Other Ambulatory Visit: Payer: Self-pay | Admitting: Neurosurgery

## 2017-07-29 NOTE — Progress Notes (Signed)
Nathan Perkins has a history of HTN and Type II diabetes.  Patient reports that CBGs have ranged 90 - 130's.  Patient sees Dr Suzette Battiest for diabetes- I have requested labs and office visit.  PCP is Dr Chauncey Cruel. [facility] in [place].- I have requested records from there also. Patient reports that he was hospitalized June of 2018 with Diabetic Ketoacidosis, "it was when I was on [place], I have not had any problems since them." (Dr Brigitte Pulse was patients Dr while he was in the hospital).  I have requested EKG tracing from [facility].  I instructed patient to take 1/2 of Novolin N tonight- 15 units, if CBG > 70 in am to take 1/2 of Novolin N - 20 units.  If CBG > 220 to take 20 units of Novolin N and 1/2 of SS Insulin.  I instructed patient to check CBG after awaking and every 2 hours until arrival  to the hospital.  I Instructed patient if CBG is less than 70 to take 4 Glucose Tablets. Recheck CBG in 15 minutes then call pre- op desk at [phone] for further instructions. If scheduled to receive Insulin, do not take Insulin.  Nathan Wolin that he is unable to walk or sit in a wheelchair, "I lie down in the [place]."  I spoke to [facility] RN , who said that staff can meet him at the [facility] [facility] entrance.  I called patient back and instructed him to call the pre- op desk  When he arrives tomorrow.

## 2017-07-30 ENCOUNTER — Ambulatory Visit (HOSPITAL_COMMUNITY): Payer: Commercial Managed Care - PPO | Admitting: Certified Registered"

## 2017-07-30 ENCOUNTER — Inpatient Hospital Stay (HOSPITAL_COMMUNITY)
Admission: AD | Admit: 2017-07-30 | Discharge: 2017-07-31 | DRG: 520 | Disposition: A | Discharge status: Home / Self Care | Payer: Commercial Managed Care - PPO | Source: Ambulatory Visit | Attending: Neurosurgery | Admitting: Neurosurgery

## 2017-07-30 ENCOUNTER — Encounter (HOSPITAL_COMMUNITY)
Admission: AD | Disposition: A | Discharge status: Home / Self Care | Payer: Self-pay | Source: Ambulatory Visit | Attending: Neurosurgery

## 2017-07-30 ENCOUNTER — Encounter (HOSPITAL_COMMUNITY): Payer: Self-pay | Admitting: *Deleted

## 2017-07-30 ENCOUNTER — Ambulatory Visit (HOSPITAL_COMMUNITY): Payer: Commercial Managed Care - PPO

## 2017-07-30 DIAGNOSIS — M5117 Intervertebral disc disorders with radiculopathy, lumbosacral region: Secondary | ICD-10-CM | POA: Diagnosis present

## 2017-07-30 DIAGNOSIS — I10 Essential (primary) hypertension: Secondary | ICD-10-CM | POA: Diagnosis present

## 2017-07-30 DIAGNOSIS — M4807 Spinal stenosis, lumbosacral region: Secondary | ICD-10-CM | POA: Diagnosis present

## 2017-07-30 DIAGNOSIS — Z419 Encounter for procedure for purposes other than remedying health state, unspecified: Secondary | ICD-10-CM

## 2017-07-30 DIAGNOSIS — Z91013 Allergy to seafood: Secondary | ICD-10-CM | POA: Diagnosis not present

## 2017-07-30 DIAGNOSIS — Z888 Allergy status to other drugs, medicaments and biological substances status: Secondary | ICD-10-CM | POA: Diagnosis not present

## 2017-07-30 DIAGNOSIS — M5126 Other intervertebral disc displacement, lumbar region: Secondary | ICD-10-CM | POA: Diagnosis present

## 2017-07-30 DIAGNOSIS — M5137 Other intervertebral disc degeneration, lumbosacral region: Secondary | ICD-10-CM | POA: Diagnosis present

## 2017-07-30 HISTORY — PX: LUMBAR LAMINECTOMY/DECOMPRESSION MICRODISCECTOMY: SHX5026

## 2017-07-30 HISTORY — DX: Adverse effect of unspecified anesthetic, initial encounter: T41.45XA

## 2017-07-30 HISTORY — DX: Other complications of anesthesia, initial encounter: T88.59XA

## 2017-07-30 HISTORY — DX: Other specified postprocedural states: Z98.890

## 2017-07-30 HISTORY — DX: Nausea with vomiting, unspecified: R11.2

## 2017-07-30 HISTORY — DX: Malignant (primary) neoplasm, unspecified: C80.1

## 2017-07-30 LAB — BASIC METABOLIC PANEL
Anion gap: 13 (ref 5–15)
BUN: 13 mg/dL (ref 6–20)
CHLORIDE: 104 mmol/L (ref 101–111)
CO2: 20 mmol/L — AB (ref 22–32)
CREATININE: 0.86 mg/dL (ref 0.61–1.24)
Calcium: 9.5 mg/dL (ref 8.9–10.3)
GFR calc non Af Amer: >60 mL/min (ref >60)
Glucose, Bld: 242 mg/dL — ABNORMAL HIGH (ref 65–99)
POTASSIUM: 3.8 mmol/L (ref 3.5–5.1)
Sodium: 137 mmol/L (ref 135–145)

## 2017-07-30 LAB — GLUCOSE, CAPILLARY
GLUCOSE-CAPILLARY: 249 mg/dL — AB (ref 65–99)
GLUCOSE-CAPILLARY: 217 mg/dL — AB (ref 65–99)
GLUCOSE-CAPILLARY: 231 mg/dL — AB (ref 65–99)
GLUCOSE-CAPILLARY: 238 mg/dL — AB (ref 65–99)
GLUCOSE-CAPILLARY: 295 mg/dL — AB (ref 65–99)

## 2017-07-30 LAB — SURGICAL PCR SCREEN
MRSA, PCR: NEGATIVE
STAPHYLOCOCCUS AUREUS: NEGATIVE

## 2017-07-30 LAB — CBC
HEMATOCRIT: 45.7 % (ref 39.0–52.0)
HEMOGLOBIN: 15.7 g/dL (ref 13.0–17.0)
MCH: 27.8 pg (ref 26.0–34.0)
MCHC: 34.4 g/dL (ref 30.0–36.0)
MCV: 80.9 fL (ref 78.0–100.0)
Platelets: 247 10*3/uL (ref 150–400)
RBC: 5.65 MIL/uL (ref 4.22–5.81)
RDW: 12.4 % (ref 11.5–15.5)
WBC: 11.4 10*3/uL — ABNORMAL HIGH (ref 4.0–10.5)

## 2017-07-30 LAB — HEMOGLOBIN A1C
Hgb A1c MFr Bld: 7.3 % — ABNORMAL HIGH (ref 4.8–5.6)
MEAN PLASMA GLUCOSE: 162.81 mg/dL

## 2017-07-30 SURGERY — LUMBAR LAMINECTOMY/DECOMPRESSION MICRODISCECTOMY 1 LEVEL
Anesthesia: General | Site: Spine Lumbar

## 2017-07-30 MED ORDER — ACETAMINOPHEN 325 MG PO TABS: 650.0000 mg | ORAL_TABLET | ORAL | Status: DC | PRN

## 2017-07-30 MED ORDER — DEXAMETHASONE SODIUM PHOSPHATE 10 MG/ML IJ SOLN
INTRAMUSCULAR | Status: DC | PRN
  Administered 2017-07-30: 10 mg via INTRAVENOUS

## 2017-07-30 MED ORDER — THROMBIN 5000 UNITS EX SOLR
CUTANEOUS | Status: AC
Start: 2017-07-30 — End: 2017-07-30
  Filled 2017-07-30: qty 5000

## 2017-07-30 MED ORDER — HYDROMORPHONE HCL 2 MG/ML IJ SOLN: 0.3000 mg | INTRAMUSCULAR | Status: DC | PRN

## 2017-07-30 MED ORDER — FENTANYL CITRATE (PF) 100 MCG/2ML IJ SOLN
INTRAMUSCULAR | Status: DC | PRN
  Administered 2017-07-30: 100 ug via INTRAVENOUS

## 2017-07-30 MED ORDER — ZOLPIDEM TARTRATE 5 MG PO TABS: 5.0000 mg | ORAL_TABLET | Freq: Every evening | ORAL | Status: DC | PRN

## 2017-07-30 MED ORDER — LIDOCAINE 2% (20 MG/ML) 5 ML SYRINGE
INTRAMUSCULAR | Status: AC
  Filled 2017-07-30: qty 5

## 2017-07-30 MED ORDER — 0.9 % SODIUM CHLORIDE (POUR BTL) OPTIME
TOPICAL | Status: DC | PRN
  Administered 2017-07-30: 1000 mL

## 2017-07-30 MED ORDER — LISINOPRIL 20 MG PO TABS
40.0000 mg | ORAL_TABLET | Freq: Every day | ORAL | Status: DC
  Administered 2017-07-30: 40 mg via ORAL
  Filled 2017-07-30: qty 2

## 2017-07-30 MED ORDER — ALUM & MAG HYDROXIDE-SIMETH 200-200-20 MG/5ML PO SUSP: 30.0000 mL | Freq: Four times a day (QID) | ORAL | Status: DC | PRN

## 2017-07-30 MED ORDER — ONDANSETRON HCL 4 MG/2ML IJ SOLN: 4.0000 mg | Freq: Four times a day (QID) | INTRAMUSCULAR | Status: DC | PRN

## 2017-07-30 MED ORDER — INSULIN ASPART 100 UNIT/ML ~~LOC~~ SOLN
SUBCUTANEOUS | Status: AC
Start: 2017-07-30 — End: 2017-07-31
  Filled 2017-07-30: qty 1

## 2017-07-30 MED ORDER — ASPIRIN EC 81 MG PO TBEC: 81.0000 mg | DELAYED_RELEASE_TABLET | Freq: Every day | ORAL | Status: DC

## 2017-07-30 MED ORDER — METHOCARBAMOL 1000 MG/10ML IJ SOLN
500.0000 mg | Freq: Four times a day (QID) | INTRAMUSCULAR | Status: DC | PRN
  Filled 2017-07-30: qty 5

## 2017-07-30 MED ORDER — DEXAMETHASONE SODIUM PHOSPHATE 10 MG/ML IJ SOLN
INTRAMUSCULAR | Status: AC
  Filled 2017-07-30: qty 1

## 2017-07-30 MED ORDER — KCL IN DEXTROSE-NACL 20-5-0.45 MEQ/L-%-% IV SOLN: INTRAVENOUS | Status: DC

## 2017-07-30 MED ORDER — MIDAZOLAM HCL 2 MG/2ML IJ SOLN
INTRAMUSCULAR | Status: AC
  Filled 2017-07-30: qty 2

## 2017-07-30 MED ORDER — ROCURONIUM BROMIDE 10 MG/ML (PF) SYRINGE
PREFILLED_SYRINGE | INTRAVENOUS | Status: AC
  Filled 2017-07-30: qty 5

## 2017-07-30 MED ORDER — METHOCARBAMOL 500 MG PO TABS
500.0000 mg | ORAL_TABLET | Freq: Four times a day (QID) | ORAL | Status: DC | PRN
  Administered 2017-07-30: 500 mg via ORAL
  Filled 2017-07-30: qty 1

## 2017-07-30 MED ORDER — LIDOCAINE-EPINEPHRINE 1 %-1:100000 IJ SOLN
INTRAMUSCULAR | Status: DC | PRN
  Administered 2017-07-30: 3.5 mL

## 2017-07-30 MED ORDER — METHYLPREDNISOLONE ACETATE 80 MG/ML IJ SUSP
INTRAMUSCULAR | Status: AC
  Filled 2017-07-30: qty 1

## 2017-07-30 MED ORDER — VITAMIN D (ERGOCALCIFEROL) 1.25 MG (50000 UNIT) PO CAPS: 50000.0000 [IU] | ORAL_CAPSULE | ORAL | Status: DC

## 2017-07-30 MED ORDER — ONDANSETRON HCL 4 MG/2ML IJ SOLN
INTRAMUSCULAR | Status: DC | PRN
  Administered 2017-07-30: 4 mg via INTRAVENOUS

## 2017-07-30 MED ORDER — LACTATED RINGERS IV SOLN
INTRAVENOUS | Status: DC
  Administered 2017-07-30 (×2): via INTRAVENOUS

## 2017-07-30 MED ORDER — PROPOFOL 10 MG/ML IV BOLUS
INTRAVENOUS | Status: AC
  Filled 2017-07-30: qty 20

## 2017-07-30 MED ORDER — FENTANYL CITRATE (PF) 100 MCG/2ML IJ SOLN
INTRAMUSCULAR | Status: DC | PRN
  Administered 2017-07-30: 100 ug via INTRAVENOUS
  Administered 2017-07-30: 50 ug via INTRAVENOUS
  Administered 2017-07-30: 100 ug via INTRAVENOUS
  Administered 2017-07-30 (×2): 50 ug via INTRAVENOUS

## 2017-07-30 MED ORDER — FENTANYL CITRATE (PF) 250 MCG/5ML IJ SOLN
INTRAMUSCULAR | Status: AC
  Filled 2017-07-30: qty 5

## 2017-07-30 MED ORDER — SUGAMMADEX SODIUM 200 MG/2ML IV SOLN
INTRAVENOUS | Status: AC
  Filled 2017-07-30: qty 2

## 2017-07-30 MED ORDER — BISACODYL 10 MG RE SUPP: 10.0000 mg | Freq: Every day | RECTAL | Status: DC | PRN

## 2017-07-30 MED ORDER — SUGAMMADEX SODIUM 200 MG/2ML IV SOLN
INTRAVENOUS | Status: DC | PRN
  Administered 2017-07-30: 190 mg via INTRAVENOUS

## 2017-07-30 MED ORDER — PANTOPRAZOLE SODIUM 40 MG PO TBEC
40.0000 mg | DELAYED_RELEASE_TABLET | Freq: Every day | ORAL | Status: DC
  Administered 2017-07-30: 40 mg via ORAL
  Filled 2017-07-30: qty 1

## 2017-07-30 MED ORDER — FLEET ENEMA 7-19 GM/118ML RE ENEM: 1.0000 | ENEMA | Freq: Once | RECTAL | Status: DC | PRN

## 2017-07-30 MED ORDER — CHLORHEXIDINE GLUCONATE CLOTH 2 % EX PADS: 6.0000 | MEDICATED_PAD | Freq: Once | CUTANEOUS | Status: DC

## 2017-07-30 MED ORDER — GELATIN ABSORBABLE MT POWD
OROMUCOSAL | Status: DC | PRN
  Administered 2017-07-30: 17:00:00 via TOPICAL

## 2017-07-30 MED ORDER — INSULIN NPH (HUMAN) (ISOPHANE) 100 UNIT/ML ~~LOC~~ SUSP
40.0000 [IU] | Freq: Every morning | SUBCUTANEOUS | Status: DC
  Administered 2017-07-31: 40 [IU] via SUBCUTANEOUS
  Filled 2017-07-30: qty 10

## 2017-07-30 MED ORDER — BUPIVACAINE HCL (PF) 0.5 % IJ SOLN
INTRAMUSCULAR | Status: AC
  Filled 2017-07-30: qty 30

## 2017-07-30 MED ORDER — ACETAMINOPHEN 650 MG RE SUPP: 650.0000 mg | RECTAL | Status: DC | PRN

## 2017-07-30 MED ORDER — IBUPROFEN 200 MG PO TABS: 800.0000 mg | ORAL_TABLET | Freq: Four times a day (QID) | ORAL | Status: DC | PRN

## 2017-07-30 MED ORDER — MUPIROCIN 2 % EX OINT
TOPICAL_OINTMENT | CUTANEOUS | Status: AC
  Administered 2017-07-30: 1
  Filled 2017-07-30: qty 22

## 2017-07-30 MED ORDER — HYDROCODONE-ACETAMINOPHEN 5-325 MG PO TABS
1.0000 | ORAL_TABLET | ORAL | Status: DC | PRN
  Administered 2017-07-30: 1 via ORAL
  Filled 2017-07-30: qty 1

## 2017-07-30 MED ORDER — INSULIN NPH (HUMAN) (ISOPHANE) 100 UNIT/ML ~~LOC~~ SUSP
30.0000 [IU] | Freq: Every evening | SUBCUTANEOUS | Status: DC
  Administered 2017-07-30: 30 [IU] via SUBCUTANEOUS
  Filled 2017-07-30: qty 10

## 2017-07-30 MED ORDER — FLAXSEED OIL 1000 MG PO CAPS: ORAL_CAPSULE | Freq: Every day | ORAL | Status: DC

## 2017-07-30 MED ORDER — MENTHOL 3 MG MT LOZG: 1.0000 | LOZENGE | OROMUCOSAL | Status: DC | PRN

## 2017-07-30 MED ORDER — PHENYLEPHRINE 40 MCG/ML (10ML) SYRINGE FOR IV PUSH (FOR BLOOD PRESSURE SUPPORT)
PREFILLED_SYRINGE | INTRAVENOUS | Status: AC
Start: 2017-07-30 — End: 2017-07-30
  Filled 2017-07-30: qty 10

## 2017-07-30 MED ORDER — AMLODIPINE BESYLATE 5 MG PO TABS: 5.0000 mg | ORAL_TABLET | Freq: Every day | ORAL | Status: DC

## 2017-07-30 MED ORDER — PROPOFOL 10 MG/ML IV BOLUS
INTRAVENOUS | Status: DC | PRN
  Administered 2017-07-30: 110 mg via INTRAVENOUS

## 2017-07-30 MED ORDER — FENTANYL CITRATE (PF) 100 MCG/2ML IJ SOLN
INTRAMUSCULAR | Status: AC
  Filled 2017-07-30: qty 2

## 2017-07-30 MED ORDER — CEFAZOLIN SODIUM-DEXTROSE 2-4 GM/100ML-% IV SOLN
INTRAVENOUS | Status: AC
  Filled 2017-07-30: qty 100

## 2017-07-30 MED ORDER — DEXTROSE 5 % IV SOLN
INTRAVENOUS | Status: DC | PRN
  Administered 2017-07-30: 50 ug/min via INTRAVENOUS

## 2017-07-30 MED ORDER — CEFAZOLIN SODIUM-DEXTROSE 2-4 GM/100ML-% IV SOLN
2.0000 g | INTRAVENOUS | Status: AC
  Administered 2017-07-30: 2 g via INTRAVENOUS
  Filled 2017-07-30: qty 100

## 2017-07-30 MED ORDER — MORPHINE SULFATE (PF) 4 MG/ML IV SOLN: 2.0000 mg | INTRAVENOUS | Status: DC | PRN

## 2017-07-30 MED ORDER — PHENYLEPHRINE HCL 10 MG/ML IJ SOLN
INTRAMUSCULAR | Status: DC | PRN
  Administered 2017-07-30: 160 ug via INTRAVENOUS
  Administered 2017-07-30: 80 ug via INTRAVENOUS
  Administered 2017-07-30: 200 ug via INTRAVENOUS
  Administered 2017-07-30 (×2): 120 ug via INTRAVENOUS

## 2017-07-30 MED ORDER — OXYCODONE-ACETAMINOPHEN 10-325 MG PO TABS
1.0000 | ORAL_TABLET | ORAL | Status: DC | PRN
Start: 2017-07-30 — End: 2017-07-30

## 2017-07-30 MED ORDER — BUPIVACAINE HCL (PF) 0.5 % IJ SOLN
INTRAMUSCULAR | Status: DC | PRN
  Administered 2017-07-30: 3.5 mL

## 2017-07-30 MED ORDER — LIDOCAINE-EPINEPHRINE 1 %-1:100000 IJ SOLN
INTRAMUSCULAR | Status: AC
  Filled 2017-07-30: qty 1

## 2017-07-30 MED ORDER — ONDANSETRON HCL 4 MG PO TABS: 4.0000 mg | ORAL_TABLET | Freq: Four times a day (QID) | ORAL | Status: DC | PRN

## 2017-07-30 MED ORDER — SODIUM CHLORIDE 0.9% FLUSH: 3.0000 mL | INTRAVENOUS | Status: DC | PRN

## 2017-07-30 MED ORDER — SODIUM CHLORIDE 0.9% FLUSH: 3.0000 mL | Freq: Two times a day (BID) | INTRAVENOUS | Status: DC

## 2017-07-30 MED ORDER — CEFAZOLIN SODIUM-DEXTROSE 2-4 GM/100ML-% IV SOLN
2.0000 g | Freq: Three times a day (TID) | INTRAVENOUS | Status: AC
  Administered 2017-07-30 – 2017-07-31 (×2): 2 g via INTRAVENOUS
  Filled 2017-07-30 (×2): qty 100

## 2017-07-30 MED ORDER — PHENYLEPHRINE 40 MCG/ML (10ML) SYRINGE FOR IV PUSH (FOR BLOOD PRESSURE SUPPORT)
PREFILLED_SYRINGE | INTRAVENOUS | Status: AC
  Filled 2017-07-30: qty 10

## 2017-07-30 MED ORDER — ROCURONIUM BROMIDE 100 MG/10ML IV SOLN
INTRAVENOUS | Status: DC | PRN
  Administered 2017-07-30: 50 mg via INTRAVENOUS

## 2017-07-30 MED ORDER — INSULIN ASPART 100 UNIT/ML ~~LOC~~ SOLN
6.0000 [IU] | Freq: Once | SUBCUTANEOUS | Status: AC
  Administered 2017-07-30: 6 [IU] via SUBCUTANEOUS

## 2017-07-30 MED ORDER — HYDROCODONE-ACETAMINOPHEN 5-325 MG PO TABS: 2.0000 | ORAL_TABLET | ORAL | Status: DC | PRN

## 2017-07-30 MED ORDER — MIDAZOLAM HCL 5 MG/5ML IJ SOLN
INTRAMUSCULAR | Status: DC | PRN
Start: 2017-07-30 — End: 2017-07-30
  Administered 2017-07-30: 2 mg via INTRAVENOUS

## 2017-07-30 MED ORDER — DOCUSATE SODIUM 100 MG PO CAPS
100.0000 mg | ORAL_CAPSULE | Freq: Two times a day (BID) | ORAL | Status: DC
  Administered 2017-07-30: 100 mg via ORAL
  Filled 2017-07-30: qty 1

## 2017-07-30 MED ORDER — ONDANSETRON HCL 4 MG/2ML IJ SOLN
INTRAMUSCULAR | Status: AC
  Filled 2017-07-30: qty 2

## 2017-07-30 MED ORDER — PHENOL 1.4 % MT LIQD: 1.0000 | OROMUCOSAL | Status: DC | PRN

## 2017-07-30 MED ORDER — LABETALOL HCL 5 MG/ML IV SOLN
INTRAVENOUS | Status: DC | PRN
  Administered 2017-07-30: 10 mg via INTRAVENOUS

## 2017-07-30 MED ORDER — METHYLPREDNISOLONE ACETATE 80 MG/ML IJ SUSP
INTRAMUSCULAR | Status: DC | PRN
  Administered 2017-07-30: 80 mg

## 2017-07-30 MED ORDER — LIDOCAINE HCL (CARDIAC) PF 100 MG/5ML IV SOSY
PREFILLED_SYRINGE | INTRAVENOUS | Status: DC | PRN
  Administered 2017-07-30: 60 mg via INTRAVENOUS

## 2017-07-30 MED ORDER — INSULIN ASPART 100 UNIT/ML ~~LOC~~ SOLN
0.0000 [IU] | Freq: Every day | SUBCUTANEOUS | Status: DC
  Administered 2017-07-30: 3 [IU] via SUBCUTANEOUS

## 2017-07-30 MED ORDER — POLYETHYLENE GLYCOL 3350 17 G PO PACK
17.0000 g | PACK | Freq: Every day | ORAL | Status: DC | PRN
  Administered 2017-07-30: 17 g via ORAL
  Filled 2017-07-30: qty 1

## 2017-07-30 MED ORDER — SODIUM CHLORIDE 0.9 % IV SOLN
250.0000 mL | INTRAVENOUS | Status: DC
Start: 2017-07-30 — End: 2017-07-31

## 2017-07-30 MED ORDER — INSULIN ASPART 100 UNIT/ML ~~LOC~~ SOLN
0.0000 [IU] | Freq: Three times a day (TID) | SUBCUTANEOUS | Status: DC
Start: 2017-07-31 — End: 2017-07-31

## 2017-07-30 SURGICAL SUPPLY — 48 items
BLADE CLIPPER SURG (BLADE) ×3 IMPLANT
BUR MATCHSTICK NEURO 3.0 LAGG (BURR) ×3 IMPLANT
BUR ROUND FLUTED 5 RND (BURR) ×2 IMPLANT
BUR ROUND FLUTED 5MM RND (BURR) ×1
CANISTER SUCT 3000ML PPV (MISCELLANEOUS) ×3 IMPLANT
CARTRIDGE OIL MAESTRO DRILL (MISCELLANEOUS) ×1 IMPLANT
DERMABOND ADVANCED (GAUZE/BANDAGES/DRESSINGS) ×2
DERMABOND ADVANCED .7 DNX12 (GAUZE/BANDAGES/DRESSINGS) ×1 IMPLANT
DIFFUSER DRILL AIR PNEUMATIC (MISCELLANEOUS) ×3 IMPLANT
DRAPE LAPAROTOMY 100X72X124 (DRAPES) ×3 IMPLANT
DRAPE MICROSCOPE LEICA (MISCELLANEOUS) ×3 IMPLANT
DRAPE SURG 17X23 STRL (DRAPES) ×3 IMPLANT
DRSG OPSITE POSTOP 3X4 (GAUZE/BANDAGES/DRESSINGS) ×3 IMPLANT
DURAPREP 26ML APPLICATOR (WOUND CARE) ×3 IMPLANT
ELECT REM PT RETURN 9FT ADLT (ELECTROSURGICAL) ×3
ELECTRODE REM PT RTRN 9FT ADLT (ELECTROSURGICAL) ×1 IMPLANT
GAUZE SPONGE 4X4 12PLY STRL (GAUZE/BANDAGES/DRESSINGS) IMPLANT
GAUZE SPONGE 4X4 16PLY XRAY LF (GAUZE/BANDAGES/DRESSINGS) IMPLANT
GLOVE BIO SURGEON STRL SZ8 (GLOVE) ×3 IMPLANT
GLOVE BIOGEL PI IND STRL 8 (GLOVE) ×1 IMPLANT
GLOVE BIOGEL PI IND STRL 8.5 (GLOVE) ×1 IMPLANT
GLOVE BIOGEL PI INDICATOR 8 (GLOVE) ×2
GLOVE BIOGEL PI INDICATOR 8.5 (GLOVE) ×2
GLOVE ECLIPSE 8.0 STRL XLNG CF (GLOVE) ×3 IMPLANT
GOWN STRL REUS W/ TWL LRG LVL3 (GOWN DISPOSABLE) ×1 IMPLANT
GOWN STRL REUS W/ TWL XL LVL3 (GOWN DISPOSABLE) ×2 IMPLANT
GOWN STRL REUS W/TWL 2XL LVL3 (GOWN DISPOSABLE) ×3 IMPLANT
GOWN STRL REUS W/TWL LRG LVL3 (GOWN DISPOSABLE) ×2
GOWN STRL REUS W/TWL XL LVL3 (GOWN DISPOSABLE) ×4
HEMOSTAT POWDER KIT SURGIFOAM (HEMOSTASIS) ×3 IMPLANT
KIT BASIN OR (CUSTOM PROCEDURE TRAY) ×3 IMPLANT
KIT TURNOVER KIT B (KITS) ×3 IMPLANT
NEEDLE HYPO 18GX1.5 BLUNT FILL (NEEDLE) IMPLANT
NEEDLE HYPO 25X1 1.5 SAFETY (NEEDLE) ×3 IMPLANT
NS IRRIG 1000ML POUR BTL (IV SOLUTION) ×3 IMPLANT
OIL CARTRIDGE MAESTRO DRILL (MISCELLANEOUS) ×3
PACK LAMINECTOMY NEURO (CUSTOM PROCEDURE TRAY) ×3 IMPLANT
PAD ARMBOARD 7.5X6 YLW CONV (MISCELLANEOUS) ×9 IMPLANT
RUBBERBAND STERILE (MISCELLANEOUS) ×6 IMPLANT
SPONGE SURGIFOAM ABS GEL SZ50 (HEMOSTASIS) IMPLANT
SUT VIC AB 0 CT1 18XCR BRD8 (SUTURE) ×1 IMPLANT
SUT VIC AB 0 CT1 8-18 (SUTURE) ×2
SUT VIC AB 2-0 CT1 18 (SUTURE) ×3 IMPLANT
SUT VIC AB 3-0 SH 8-18 (SUTURE) ×3 IMPLANT
SYR 5ML LL (SYRINGE) IMPLANT
TOWEL GREEN STERILE (TOWEL DISPOSABLE) ×3 IMPLANT
TOWEL GREEN STERILE FF (TOWEL DISPOSABLE) ×3 IMPLANT
WATER STERILE IRR 1000ML POUR (IV SOLUTION) ×3 IMPLANT

## 2017-07-30 NOTE — Anesthesia Procedure Notes (Signed)
Procedure Name: Intubation Date/Time: 07/30/2017 3:28 PM Performed by: Lance Coon, CRNA Pre-anesthesia Checklist: Patient identified, Emergency Drugs available, Suction available, Timeout performed and Patient being monitored Patient Re-evaluated:Patient Re-evaluated prior to induction Oxygen Delivery Method: Circle system utilized Preoxygenation: Pre-oxygenation with 100% oxygen Induction Type: IV induction Ventilation: Mask ventilation without difficulty Laryngoscope Size: Miller and 3 Grade View: Grade I Tube type: Oral Tube size: 7.5 mm Number of attempts: 1 Airway Equipment and Method: Stylet Placement Confirmation: ETT inserted through vocal cords under direct vision,  positive ETCO2 and breath sounds checked- equal and bilateral Secured at: 21 cm Tube secured with: Tape Dental Injury: Teeth and Oropharynx as per pre-operative assessment

## 2017-07-30 NOTE — Brief Op Note (Signed)
07/30/2017  5:01 PM  PATIENT:  Nathan Perkins  62 y.o. male  PRE-OPERATIVE DIAGNOSIS:  [place], LUMBAR L 5 S 1 level  POST-OPERATIVE DIAGNOSIS:  DISC DISPLACEMENT, LUMBAR L 5 S 1 level  PROCEDURE:  Procedure(s) with comments: MICRODISCECTOMY RIGHT LUMBAR FIVE- SACRAL ONE (N/A) - MICRODISCECTOMY RIGHT LUMBAR 5- SACRAL 1 Redo  SURGEON:  Surgeon(s) and Role:    Erline Levine, MD - Primary    * Newman Pies, MD - Assisting  PHYSICIAN ASSISTANT:   ASSISTANTS: Poteat, RN   ANESTHESIA:   general  EBL:  50 mL   BLOOD ADMINISTERED:none  DRAINS: none   LOCAL MEDICATIONS USED:  LIDOCAINE   SPECIMEN:  No Specimen  DISPOSITION OF SPECIMEN:  N/A  COUNTS:  YES  TOURNIQUET:  * No tourniquets in log *  DICTATION:  DICTATION: Patient has a large L 5 S 1 disc rupture on the right with significant right leg weakness and severe pain. It was elected to take him to surgery for redo right L 5 S 1  microdiscectomy on an urgent basis.  Procedure: Patient was brought to the operating room and following the smooth and uncomplicated induction of general endotracheal anesthesia she was placed in a prone position on the Wilson frame. Low back was prepped and draped in the usual sterile fashion with betadine scrub and DuraPrep. Area of planned incision was infiltrated with local lidocaine. Incision was made in the midline and carried to the lumbodorsal fascia which was incised on the right side of midline. Subperiosteal dissection was performed exposing what was felt to be L 5 S 1  level and previous laminotomy defect. Intraoperative x-ray confirmed correct level. The previous bony defect was defined. Under the microscope,the lateral  Neural elements were draped under pressure over this large herniation.  Using painstaking microdissection, I was able to mobilize these neural elements and then incised the capsule around the disc and removed a large fragment of disc material. There was a  large amount of additional disc material which I removed, which extended into the interspace.  I then cleared the interspace of residual disc material, both medially and laterally.  was able at this point to mobilize the nerve medially and to palpate along its course with ball hooks and confirm that there were no additional compressive disc fragments. The interspace was then irrigated with bacitracin saline and no additional disc material was mobilized. Hemostasis was assured with Surgifoam and the interspace was irrigated with Depo-Medrol and fentanyl. The lumbodorsal fascia was closed with 0 Vicryl sutures the subcutaneous tissues reapproximated 2-0 Vicryl inverted sutures and the skin edges were reapproximated with 3-0 Vicryl subcuticular stitch. The wound is dressed with Dermabond and an occlusive dressing. Patient was extubated in the operating room and taken to recovery in stable and satisfactory condition having tolerated his operation well. Counts were correct at the end of the case.  PLAN OF CARE: Admit to inpatient   PATIENT DISPOSITION:  PACU - hemodynamically stable.   Delay start of Pharmacological VTE agent (>24hrs) due to surgical blood loss or risk of bleeding: yes

## 2017-07-30 NOTE — Transfer of Care (Signed)
Immediate Anesthesia Transfer of Care Note  Patient: Nathan Perkins  Procedure(s) Performed: MICRODISCECTOMY RIGHT LUMBAR FIVE- SACRAL ONE (N/A Spine Lumbar)  Patient Location: PACU  Anesthesia Type:General  Level of Consciousness: awake and patient cooperative  Airway & Oxygen Therapy: Patient Spontanous Breathing  Post-op Assessment: Report given to RN and Post -op Vital signs reviewed and stable  Post vital signs: Reviewed and stable  Last Vitals:  Vitals Value Taken Time  BP 158/90 07/30/2017  5:29 PM  Temp    Pulse 90 07/30/2017  5:31 PM  Resp 24 07/30/2017  5:31 PM  SpO2 94 % 07/30/2017  5:31 PM  Vitals shown include unvalidated device data.  Last Pain:  Vitals:   07/30/17 1311  TempSrc:   PainSc: 10-Worst pain ever      Patients Stated Pain Goal: 10(can tolerate of whole lot) (47/18/55 0158)  Complications: No apparent anesthesia complications

## 2017-07-30 NOTE — Op Note (Signed)
07/30/2017  5:01 PM  PATIENT:  Nathan Perkins  62 y.o. male  PRE-OPERATIVE DIAGNOSIS:  [place], LUMBAR L 5 S 1 level  POST-OPERATIVE DIAGNOSIS:  DISC DISPLACEMENT, LUMBAR L 5 S 1 level  PROCEDURE:  Procedure(s) with comments: MICRODISCECTOMY RIGHT LUMBAR FIVE- SACRAL ONE (N/A) - MICRODISCECTOMY RIGHT LUMBAR 5- SACRAL 1 Redo  SURGEON:  Surgeon(s) and Role:    Erline Levine, MD - Primary    * Newman Pies, MD - Assisting  PHYSICIAN ASSISTANT:   ASSISTANTS: Poteat, RN   ANESTHESIA:   general  EBL:  50 mL   BLOOD ADMINISTERED:none  DRAINS: none   LOCAL MEDICATIONS USED:  LIDOCAINE   SPECIMEN:  No Specimen  DISPOSITION OF SPECIMEN:  N/A  COUNTS:  YES  TOURNIQUET:  * No tourniquets in log *  DICTATION:  DICTATION: Patient has a large L 5 S 1 disc rupture on the right with significant right leg weakness and severe pain. It was elected to take him to surgery for redo right L 5 S 1  microdiscectomy on an urgent basis.  Procedure: Patient was brought to the operating room and following the smooth and uncomplicated induction of general endotracheal anesthesia she was placed in a prone position on the Wilson frame. Low back was prepped and draped in the usual sterile fashion with betadine scrub and DuraPrep. Area of planned incision was infiltrated with local lidocaine. Incision was made in the midline and carried to the lumbodorsal fascia which was incised on the right side of midline. Subperiosteal dissection was performed exposing what was felt to be L 5 S 1  level and previous laminotomy defect. Intraoperative x-ray confirmed correct level. The previous bony defect was defined. Under the microscope,the lateral  Neural elements were draped under pressure over this large herniation.  Using painstaking microdissection, I was able to mobilize these neural elements and then incised the capsule around the disc and removed a large fragment of disc material. There was a  large amount of additional disc material which I removed, which extended into the interspace.  I then cleared the interspace of residual disc material, both medially and laterally.  was able at this point to mobilize the nerve medially and to palpate along its course with ball hooks and confirm that there were no additional compressive disc fragments. The interspace was then irrigated with bacitracin saline and no additional disc material was mobilized. Hemostasis was assured with Surgifoam and the interspace was irrigated with Depo-Medrol and fentanyl. The lumbodorsal fascia was closed with 0 Vicryl sutures the subcutaneous tissues reapproximated 2-0 Vicryl inverted sutures and the skin edges were reapproximated with 3-0 Vicryl subcuticular stitch. The wound is dressed with Dermabond and an occlusive dressing. Patient was extubated in the operating room and taken to recovery in stable and satisfactory condition having tolerated his operation well. Counts were correct at the end of the case.  PLAN OF CARE: Admit to inpatient   PATIENT DISPOSITION:  PACU - hemodynamically stable.   Delay start of Pharmacological VTE agent (>24hrs) due to surgical blood loss or risk of bleeding: yes

## 2017-07-30 NOTE — Anesthesia Preprocedure Evaluation (Addendum)
Anesthesia Evaluation  Patient identified by MRN, date of birth, ID band Patient awake    Reviewed: Allergy & Precautions, H&P , NPO status , Patient's Chart, lab work & pertinent test results  History of Anesthesia Complications (+) PONV  Airway Mallampati: II  TM Distance: >3 FB Neck ROM: Full    Dental no notable dental hx. (+) Teeth Intact, Dental Advisory Given   Pulmonary neg pulmonary ROS, former smoker,    Pulmonary exam normal breath sounds clear to auscultation       Cardiovascular hypertension, Pt. on medications  Rhythm:Regular Rate:Normal     Neuro/Psych negative neurological ROS  negative psych ROS   GI/Hepatic negative GI ROS, Neg liver ROS,   Endo/Other  diabetes, Insulin Dependent  Renal/GU negative Renal ROS  negative genitourinary   Musculoskeletal   Abdominal   Peds  Hematology negative hematology ROS (+)   Anesthesia Other Findings   Reproductive/Obstetrics negative OB ROS                            Anesthesia Physical Anesthesia Plan  ASA: III  Anesthesia Plan: General   Post-op Pain Management:    Induction: Intravenous  PONV Risk Score and Plan: 4 or greater and Ondansetron, Midazolam, Treatment may vary due to age or medical condition and Diphenhydramine  Airway Management Planned: Oral ETT  Additional Equipment:   Intra-op Plan:   Post-operative Plan: Extubation in OR  Informed Consent: I have reviewed the patients History and Physical, chart, labs and discussed the procedure including the risks, benefits and alternatives for the proposed anesthesia with the patient or authorized representative who has indicated his/her understanding and acceptance.   Dental advisory given  Plan Discussed with: CRNA  Anesthesia Plan Comments:        Anesthesia Quick Evaluation

## 2017-07-30 NOTE — Interval H&P Note (Signed)
History and Physical Interval Note:  07/30/2017 3:08 PM  Nathan Perkins  has presented today for surgery, with the diagnosis of DISC DISPLACEMENT, LUMBAR  The various methods of treatment have been discussed with the patient and family. After consideration of risks, benefits and other options for treatment, the patient has consented to  Procedure(s) with comments: MICRODISCECTOMY RIGHT LUMBAR 5- SACRAL 1 (N/A) - MICRODISCECTOMY RIGHT LUMBAR 5- SACRAL 1 as a surgical intervention .  The patient's history has been reviewed, patient examined, no change in status, stable for surgery.  I have reviewed the patient's chart and labs.  Questions were answered to the patient's satisfaction.     [name]

## 2017-07-30 NOTE — H&P (Signed)
Patient ID:   [id number] Patient: Nathan Perkins  Date of Birth: [birth year] Visit Type: Office Visit   Date: 07/27/2017 12:15 PM Provider: Marchia Meiers. Vertell Limber MD   This 62 year old male presents for back pain.  HISTORY OF PRESENT ILLNESS:  1.  back pain  The patient comes in today screaming in pain and having severe pain running down his right leg.  He has tried a Medrol Dosepak and says this did not help him much at all.  He is only able to get relief when he lays down.  He is only able to stand for a maximum of 1 min before he has severe pain and muscle a down.  He is not able to bear full weight on his right leg.  On examination today the patient has plantar flexion weakness and is unable to bear full weight on his right lower extremity.  He has a markedly positive straight leg raise on the right.  His lumbar incision appears to be well-healed.  My concern is that the patient has a recurrent disc herniation.  He did well after his surgery and then developed sudden severe recurrence of pain.  I am sending him for an expedited MRI of the lumbar spine.         Medical/Surgical/Interim History Reviewed, no change.  Last detailed document date:06/21/2017.     PAST MEDICAL HISTORY, SURGICAL HISTORY, FAMILY HISTORY, SOCIAL HISTORY AND REVIEW OF SYSTEMS I have reviewed the patient's past medical, surgical, family and social history as well as the comprehensive review of systems as included on the [facility] history form dated 06/21/2017, which I have signed.  Family History:  Reviewed, no changes.  Last detailed document date:06/21/2017.   Social History: Reviewed, no changes. Last detailed document date: 06/21/2017.    MEDICATIONS: (added, continued or stopped this visit) Started Medication Directions Instruction Stopped   Aspir-81 81 mg tablet,delayed release take 1 tablet by oral route  every day     ergocalciferol (vitamin D2) 50,000 unit  capsule take 1 capsule by oral route  every week     Humalog KwikPen (U-100) Insulin 100 unit/mL subcutaneous inject 40units in the morning and 25-30units int he evening by subcutaneous route per prescriber's instructions.    07/07/2017 hydrocodone 10 mg-acetaminophen 325 mg tablet take 1 tablet by oral route  every 4 - 6 hours as needed for pain     lisinopril 40 mg tablet take 1 tablet by oral route  every day    07/07/2017 Medrol (Pak) 4 mg tablets in a dose pack Take as directed     Norvasc 5 mg tablet take 1 tablet by oral route  every day     omeprazole 20 mg tablet,delayed release take 1 capsule by oral route  every day    07/27/2017 Percocet 10 mg-325 mg tablet take 1 tablet by oral route  every 4 hours as needed     prednisone 5 mg tablet take 1 tablet by oral route  every day     Robaxin 500 mg tablet take 1 tablet by oral route 2 times every day       ALLERGIES: Ingredient Reaction Medication Name Comment  CANAGLIFLOZIN  Invokana   SULFA (SULFONAMIDE ANTIBIOTICS) Difficulty breathing     Reviewed, no changes.    PHYSICAL EXAM:   Vitals Date Temp F BP Pulse Ht In Wt Lb BMI BSA Pain Score  07/27/2017  151/90 95 72 200 27.12  10/10  IMPRESSION:   The patient appears to have a recurrent radiculopathy at the L5-S1 level on the right.  He is in miserable pain.  He is unable to stand for any length of time.  He complains of severe pain radiating down his right leg and is only able to get minimal relief when he lays still on his side.  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  PLAN:  Based on the patient's severe right leg pain and large recurrent disc herniation, I have recommended proceeding with redo right L5-S1 microdiskectomy.  We will set this up for tomorrow.  Risks and benefits were discussed in detail with the patient and he wishes to proceed with surgery.  Orders: Diagnostic  Procedures: Assessment Procedure  M54.16 MRI Spine/lumb With & W/o Contrast  Instruction(s)/Education: Assessment Instruction  I10 Hypertension education  Z68.27 Dietary management education, guidance, and counseling   Completed Orders (this encounter) Order Details Reason Side Interpretation Result Initial Treatment Date Region  Hypertension education Patient to follow up with primary care provider.        Dietary management education, guidance, and counseling patient encouraged to eat a well balanced diet         Assessment/Plan   # Detail Type Description   1. Assessment Right leg weakness (R29.898).       2. Assessment Low back pain, unspecified back pain laterality, with sciatica presence unspecified (M54.5).       3. Assessment Disc displacement, lumbar (M51.26).       4. Assessment Radiculopathy, lumbar region (M54.16).       5. Assessment Essential (primary) hypertension (I10).       6. Assessment Body mass index (BMI) 27.0-27.9, adult (I77.82).   Plan Orders Today's instructions / counseling include(s) Dietary management education, guidance, and counseling.         Pain Management Plan Pain Scale: 10/10. Method: Numeric Pain Intensity Scale. Location: back. Onset: 02/21/2017. Duration: varies. Quality: discomforting. Pain management follow-up plan of care: Patient taking medication as prescribed.[name] Kitchen     MEDICATIONS PRESCRIBED TODAY    Rx Quantity Refills  PERCOCET 10 mg-325 mg  90 0            Provider:  Vertell Limber MD, Marchia Meiers 07/27/2017 4:02 PM  Dictation edited by: Marchia Meiers. Vertell Limber    CC Providers: [facility] [address] [place],  [place]  [id number]   Saya Mccoll MD  [address] [place], [place] [address]              Electronically signed by Marchia Meiers. Vertell Limber MD on 07/27/2017 04:02 PM

## 2017-07-31 LAB — GLUCOSE, CAPILLARY: GLUCOSE-CAPILLARY: 196 mg/dL — AB (ref 65–99)

## 2017-07-31 MED ORDER — OXYCODONE-ACETAMINOPHEN 10-325 MG PO TABS: 1.0000 | ORAL_TABLET | ORAL | 0 refills | Status: DC | PRN

## 2017-07-31 NOTE — Discharge Summary (Signed)
Physician Discharge Summary  Patient ID: Nathan Perkins MRN: [id number] DOB/AGE: 1956/02/01 62 y.o.  Admit date: 07/30/2017 Discharge date: 07/31/2017  Admission Diagnoses: lumbar stenosis    Discharge Diagnoses:  same   Discharged Condition: good  Hospital Course: The patient was admitted on 07/30/2017 and taken to the operating room where the patient underwent lum lam. The patient tolerated the procedure well and was taken to the recovery room and then to the floor in stable condition. The hospital course was routine. There were no complications. The wound remained clean dry and intact. Pt had appropriate back soreness. No complaints of leg pain or new N/T/W. The patient remained afebrile with stable vital signs, and tolerated a regular diet. The patient continued to increase activities, and pain was well controlled with oral pain medications.   Consults: None  Significant Diagnostic Studies:  Results for orders placed or performed during the hospital encounter of 07/30/17  Surgical pcr screen  Result Value Ref Range   MRSA, PCR NEGATIVE NEGATIVE   Staphylococcus aureus NEGATIVE NEGATIVE  CBC  Result Value Ref Range   WBC 11.4 (H) 4.0 - 10.5 K/uL   RBC 5.65 4.22 - 5.81 MIL/uL   Hemoglobin 15.7 13.0 - 17.0 g/dL   HCT 45.7 39.0 - 52.0 %   MCV 80.9 78.0 - 100.0 fL   MCH 27.8 26.0 - 34.0 pg   MCHC 34.4 30.0 - 36.0 g/dL   RDW 12.4 11.5 - 15.5 %   Platelets 247 150 - 400 K/uL  Basic metabolic panel  Result Value Ref Range   Sodium 137 135 - 145 mmol/L   Potassium 3.8 3.5 - 5.1 mmol/L   Chloride 104 101 - 111 mmol/L   CO2 20 (L) 22 - 32 mmol/L   Glucose, Bld 242 (H) 65 - 99 mg/dL   BUN 13 6 - 20 mg/dL   Creatinine, Ser 0.86 0.61 - 1.24 mg/dL   Calcium 9.5 8.9 - 10.3 mg/dL   GFR calc non Af Amer >60 >60 mL/min   GFR calc Af Amer >60 >60 mL/min   Anion gap 13 5 - 15  Hemoglobin A1c  Result Value Ref Range   Hgb A1c MFr Bld 7.3 (H) 4.8 - 5.6 %   Mean Plasma Glucose  162.81 mg/dL  Glucose, capillary  Result Value Ref Range   Glucose-Capillary 249 (H) 65 - 99 mg/dL   Comment 1 Notify RN   Glucose, capillary  Result Value Ref Range   Glucose-Capillary 217 (H) 65 - 99 mg/dL  Glucose, capillary  Result Value Ref Range   Glucose-Capillary 231 (H) 65 - 99 mg/dL  Glucose, capillary  Result Value Ref Range   Glucose-Capillary 238 (H) 65 - 99 mg/dL  Glucose, capillary  Result Value Ref Range   Glucose-Capillary 295 (H) 65 - 99 mg/dL   Comment 1 Notify RN    Comment 2 Document in Chart   Glucose, capillary  Result Value Ref Range   Glucose-Capillary 196 (H) 65 - 99 mg/dL   Comment 1 Notify RN    Comment 2 Document in Chart     Mr Lumbar Spine W Wo Contrast  Result Date: 07/27/2017 CLINICAL DATA:  Severe back pain radiating down the right leg. Recent surgery at L5-S1 on 06/25/2017. EXAM: MRI LUMBAR SPINE WITHOUT AND WITH CONTRAST TECHNIQUE: Multiplanar and multiecho pulse sequences of the lumbar spine were obtained without and with intravenous contrast. CONTRAST:  35mL MULTIHANCE GADOBENATE DIMEGLUMINE 529 MG/ML IV SOLN COMPARISON:  MRI lumbar  spine dated April 07, 2017. FINDINGS: Segmentation:  Standard. Alignment: Mild straightening of the normal lumbar lordosis. Sagittal alignment is maintained. Vertebrae: Mild marrow heterogeneity without suspicious osseous lesion. Scattered small hemangiomas throughout the lumbar spine. No fracture or evidence of discitis. Conus medullaris and cauda equina: Conus extends to the T12-L1 level. Conus and cauda equina appear normal. Mild enhancement of the right S1 ventral nerve root. Paraspinal and other soft tissues: Negative. Disc levels: T11-T12: Only seen on the sagittal images.  Negative. T12-L1:  Only seen on the sagittal images.  Negative. L1-L2:  Negative. L2-L3:  Negative. L3-L4:  Negative. L4-L5: Unchanged shallow disc bulge with left foraminal annular fissure. No stenosis. L5-S1: Postsurgical changes related to  interval right hemilaminectomy with a small amount of fluid in the laminectomy bed. There is a recurrent right subarticular disc protrusion severely narrowing the right lateral recess and impinging on the descending right S1 nerve root. Mild bilateral facet arthropathy is unchanged. No spinal canal or neuroforaminal stenosis. IMPRESSION: 1. Postsurgical changes related to interval right hemilaminectomy at L5-S1 with recurrent right subarticular disc protrusion impinging on the descending right S1 nerve root. 2. Mild enhancement of the right S1 ventral nerve root, consistent with radiculitis. These results will be called to the ordering clinician or representative by the Radiology Department at the imaging location. Electronically Signed   By: Titus Dubin M.D.   On: 07/27/2017 15:30   Dg Lumbar Spine 1 View  Result Date: 07/30/2017 CLINICAL DATA:  Micro discectomy EXAM: LUMBAR SPINE - 1 VIEW COMPARISON:  MRI 07/27/2017, radiograph 06/25/2017 FINDINGS: Single lateral view of the lumbar spine. Lumbar numbering consistent with comparison MRI. Surgical devices and localizing instruments posterior to the L5-S1 disc space. IMPRESSION: Single lateral view of the lumbar spine for localization during lumbar spine surgery. Electronically Signed   By: Donavan Foil M.D.   On: 07/30/2017 17:47    Antibiotics:  Anti-infectives (From admission, onward)   Start     Dose/Rate Route Frequency Ordered Stop   07/31/17 0600  ceFAZolin (ANCEF) IVPB 2g/100 mL premix     2 g 200 mL/hr over 30 Minutes Intravenous On call to O.R. 07/30/17 1256 07/30/17 1540   07/30/17 2300  ceFAZolin (ANCEF) IVPB 2g/100 mL premix     2 g 200 mL/hr over 30 Minutes Intravenous Every 8 hours 07/30/17 1845 07/31/17 0645   07/30/17 1301  ceFAZolin (ANCEF) 2-4 GM/100ML-% IVPB    Note to Pharmacy:  Laurita Quint   : cabinet override      07/30/17 1301 07/30/17 1540      Discharge Exam: Blood pressure 131/84, pulse (!) 102, temperature  98.3 F (36.8 C), temperature source Oral, resp. rate 20, height 6' (1.829 m), weight 90.7 kg (200 lb), SpO2 95 %. Neurologic: Grossly normal Incision CDI  Discharge Medications:   Allergies as of 07/31/2017      Reactions   Invokana [canagliflozin] Other (See Comments)   KETOACIDOSIS    Percocet [oxycodone-acetaminophen] Itching   Shellfish Allergy    Shrimp - possible Iodine allergy UNSPECIFIED REACTION    Sulfa Antibiotics    UNSPECIFIED REACTION       Medication List    TAKE these medications   amLODipine 5 MG tablet Commonly known as:  NORVASC Take 5 mg by mouth daily.   aspirin 81 MG tablet Take 81 mg by mouth daily.   FLAXSEED OIL PO Take 1 tablet by mouth daily.   HUMALOG KWIKPEN 100 UNIT/ML KiwkPen Generic drug:  insulin lispro  Inject 4-10 Units into the muscle 3 (three) times daily as needed (Sliding scale).   ibuprofen 200 MG tablet Commonly known as:  ADVIL,MOTRIN Take 800 mg by mouth every 6 (six) hours as needed.   lisinopril 40 MG tablet Commonly known as:  PRINIVIL,ZESTRIL Take 40 mg by mouth daily.   NOVOLIN N RELION 100 UNIT/ML injection Generic drug:  insulin NPH Human Inject 30-40 Units into the skin See admin instructions. 40 units in the morning, and 30 units in the evening   omeprazole 20 MG capsule Commonly known as:  PRILOSEC Take 20 mg by mouth 2 (two) times daily before a meal.   oxyCODONE-acetaminophen 10-325 MG tablet Commonly known as:  PERCOCET Take 1 tablet by mouth every 4 (four) hours as needed for pain.   Vitamin D (Ergocalciferol) 50000 units Caps capsule Commonly known as:  DRISDOL Take 1 capsule by mouth once a week. Sunday       Disposition: home   Final Dx: lumbar laminectomy  Discharge Instructions     Remove dressing in 72 hours   Complete by:  As directed    Call MD for:  difficulty breathing, headache or visual disturbances   Complete by:  As directed    Call MD for:  persistant nausea and vomiting    Complete by:  As directed    Call MD for:  redness, tenderness, or signs of infection (pain, swelling, redness, odor or green/yellow discharge around incision site)   Complete by:  As directed    Call MD for:  severe uncontrolled pain   Complete by:  As directed    Call MD for:  temperature >100.4   Complete by:  As directed    Diet - low sodium heart healthy   Complete by:  As directed    Increase activity slowly   Complete by:  As directed          Signed: Daionna Crossland S 07/31/2017, 9:25 AM

## 2017-07-31 NOTE — Evaluation (Signed)
Physical Therapy Evaluation Patient Details Name: Nathan Perkins MRN: [id number] DOB: 1955-11-03 Today's Date: 07/31/2017   History of Present Illness  Pt is a 62 y.o. 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 PMH significant for HTN and diabetes mellitus type II.    Clinical Impression  Pt presented supine in bed with HOB elevated, awake and willing to participate in therapy session. Prior to admission, pt reported that he was having a great deal of pain but was able to ambulate and perform ADLs independently with extra time. Pt lives with his wife in a single level house with a level entry. Pt currently able to perform bed mobility with supervision, transfers with supervision and ambulated in hallway with supervision without an AD. PT provided back precautions handout to pt and reviewed throughout. PT also discussed a generalized walking program for pt to initiate upon d/c home. Pt verbalized understanding and agreeable to plan. No further acute PT needs identified at this time. PT signing off.     Follow Up Recommendations No PT follow up    Equipment Recommendations  None recommended by PT    Recommendations for Other Services       Precautions / Restrictions Precautions Precautions: Back Precaution Booklet Issued: Yes (comment) Precaution Comments: PT provided back precautions handout and reviewed 3/3 back precautions with pt throughout Required Braces or Orthoses: ("no brace" per MD orders) Restrictions Weight Bearing Restrictions: No      Mobility  Bed Mobility Overal bed mobility: Needs Assistance Bed Mobility: Rolling;Sidelying to Sit Rolling: Supervision Sidelying to sit: Supervision       General bed mobility comments: cueing for log roll, supervision for safety; HOB flat  Transfers Overall transfer level: Needs assistance Equipment used: None Transfers: Sit to/from Stand Sit to Stand: Supervision          General transfer comment: supervision for safety, no AD needed  Ambulation/Gait Ambulation/Gait assistance: Supervision Ambulation Distance (Feet): 500 Feet Assistive device: None Gait Pattern/deviations: Step-through pattern Gait velocity: WFL Gait velocity interpretation: 1.31 - 2.62 ft/sec, indicative of limited community ambulator General Gait Details: no instability, LOB or need for physical assistance, supervision for safety  Stairs            Wheelchair Mobility    Modified Rankin (Stroke Patients Only)       Balance Overall balance assessment: No apparent balance deficits (not formally assessed)                                           Pertinent Vitals/Pain Pain Assessment: 0-10 Pain Score: 3  Pain Location: R lower back Pain Descriptors / Indicators: Sore Pain Intervention(s): Monitored during session    Home Living Family/patient expects to be discharged to:: Private residence Living Arrangements: Spouse/significant other Available Help at Discharge: Family;Available 24 hours/day Type of Home: House Home Access: Level entry     Home Layout: One level Home Equipment: Cane - single point      Prior Function Level of Independence: Independent               Hand Dominance        Extremity/Trunk Assessment   Upper Extremity Assessment Upper Extremity Assessment: Defer to OT evaluation    Lower Extremity Assessment Lower Extremity Assessment: Overall WFL for tasks assessed    Cervical / Trunk  Assessment Cervical / Trunk Assessment: Other exceptions Cervical / Trunk Exceptions: s/p lumbar sx  Communication   Communication: No difficulties  Cognition Arousal/Alertness: Awake/alert Behavior During Therapy: WFL for tasks assessed/performed Overall Cognitive Status: Within Functional Limits for tasks assessed                                        General Comments      Exercises      Assessment/Plan    PT Assessment Patent does not need any further PT services  PT Problem List         PT Treatment Interventions      PT Goals (Current goals can be found in the Care Plan section)  Acute Rehab PT Goals Patient Stated Goal: return home; return to work    Frequency     Barriers to discharge        Co-evaluation               AM-PAC PT "6 Clicks" Daily Activity  Outcome Measure Difficulty turning over in bed (including adjusting bedclothes, sheets and blankets)?: None Difficulty moving from lying on back to sitting on the side of the bed? : None Difficulty sitting down on and standing up from a chair with arms (e.g., wheelchair, bedside commode, etc,.)?: None Help needed moving to and from a bed to chair (including a wheelchair)?: None Help needed walking in hospital room?: None Help needed climbing 3-5 steps with a railing? : None 6 Click Score: 24    End of Session Equipment Utilized During Treatment: Gait belt Activity Tolerance: Patient tolerated treatment well Patient left: in chair;with call bell/phone within reach Nurse Communication: Mobility status PT Visit Diagnosis: Pain Pain - part of body: (back)    Time: 2993-7169 PT Time Calculation (min) (ACUTE ONLY): 20 min   Charges:   PT Evaluation $PT Eval Low Complexity: 1 Low     PT G Codes:        [place], PT, DPT [place] 07/31/2017, 8:26 AM

## 2017-07-31 NOTE — Evaluation (Addendum)
Occupational Therapy Evaluation and Discharge Patient Details Name: Nathan Perkins MRN: [id number] DOB: 1956/01/06 Today's Date: 07/31/2017    History of Present Illness Pt is a 62 y.o. 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 PMH significant for HTN and diabetes mellitus type II.   Clinical Impression   PTA, pt was highly limited by pain with all mobility and ADL participation. His wife has been providing assistance secondary to pain but he reports that he was physically able to complete ADL. Pt currently requiring overall supervision for safety with ADL and reports much improved pain management post-operatively. Pt and wife educated concerning back precautions related to ADL including compensatory strategies for dressing, bathing, toileting, and shower transfers. They verbalize and demonstrate understanding and wife is able to provide the necessary supervision and assistance. No further acute OT needs identified. OT will sign off.     Follow Up Recommendations  No OT follow up;Supervision/Assistance - 24 hour(initial 24 hour assist)    Equipment Recommendations  None recommended by OT    Recommendations for Other Services       Precautions / Restrictions Precautions Precautions: Back Precaution Booklet Issued: Yes (comment) Precaution Comments: Handout previously provided by PT. Pt able to recall 3/3 precautions and adhere to these during session.  Required Braces or Orthoses: ("no brace needed" per MD orders) Restrictions Weight Bearing Restrictions: No      Mobility Bed Mobility Overal bed mobility: Needs Assistance Bed Mobility: Rolling;Sidelying to Sit Rolling: Supervision Sidelying to sit: Supervision       General bed mobility comments: OOB in recliner but able to verbalize technique.   Transfers Overall transfer level: Needs assistance Equipment used: None Transfers: Sit to/from Stand Sit to Stand:  Supervision         General transfer comment: General supervision for safety.     Balance Overall balance assessment: No apparent balance deficits (not formally assessed)                                         ADL either performed or assessed with clinical judgement   ADL Overall ADL's : Needs assistance/impaired                                       General ADL Comments: Overall supervision for all dressing, bathing, grooming, toileting, and shower transfers. Pt able to adhere to back precautions throughout all tasks. Educated concerning no scrubbing over incision, pat dry with clean towel, and limit sitting time to 30-45 minutes at a time.      Vision Patient Visual Report: No change from baseline Vision Assessment?: No apparent visual deficits     Perception     Praxis      Pertinent Vitals/Pain Pain Assessment: Faces Pain Score: 3  Faces Pain Scale: Hurts little more Pain Location: R lower back Pain Descriptors / Indicators: Sore Pain Intervention(s): Monitored during session;Repositioned     Hand Dominance     Extremity/Trunk Assessment Upper Extremity Assessment Upper Extremity Assessment: Overall WFL for tasks assessed   Lower Extremity Assessment Lower Extremity Assessment: RLE deficits/detail RLE Deficits / Details: Reports intermittent numbness in R heel. Strength WFL for ADL transfers   Cervical / Trunk Assessment Cervical / Trunk Assessment: Other exceptions Cervical /  Trunk Exceptions: s/p lumbar sx   Communication Communication Communication: No difficulties   Cognition Arousal/Alertness: Awake/alert Behavior During Therapy: WFL for tasks assessed/performed Overall Cognitive Status: Within Functional Limits for tasks assessed                                     General Comments  Wife present at end of session. Pt requesting OT assist to clean betadine off of his back. Assisted with soap and  water on washcloth.     Exercises     Shoulder Instructions      Home Living Family/patient expects to be discharged to:: Private residence Living Arrangements: Spouse/significant other Available Help at Discharge: Family;Available 24 hours/day Type of Home: House Home Access: Level entry     Home Layout: One level     Bathroom Shower/Tub: Occupational psychologist: Standard     Home Equipment: Cane - single point          Prior Functioning/Environment Level of Independence: Independent                 OT Problem List: Decreased activity tolerance;Pain      OT Treatment/Interventions:      OT Goals(Current goals can be found in the care plan section) Acute Rehab OT Goals Patient Stated Goal: return home; return to work OT Goal Formulation: With patient/family  OT Frequency:     Barriers to D/C:            Co-evaluation              AM-PAC PT "6 Clicks" Daily Activity     Outcome Measure Help from another person eating meals?: None Help from another person taking care of personal grooming?: A Little Help from another person toileting, which includes using toliet, bedpan, or urinal?: A Little Help from another person bathing (including washing, rinsing, drying)?: A Little Help from another person to put on and taking off regular upper body clothing?: A Little Help from another person to put on and taking off regular lower body clothing?: A Little 6 Click Score: 19   End of Session Nurse Communication: Mobility status  Activity Tolerance: Patient tolerated treatment well Patient left: with family/visitor present;Other (comment)(ambulating in room with wife present)  OT Visit Diagnosis: Other abnormalities of gait and mobility (R26.89);Pain Pain - Right/Left: Right Pain - part of body: (back )                Time: 2094-7096 OT Time Calculation (min): 20 min Charges:  OT General Charges $OT Visit: 1 Visit OT Evaluation $OT Eval  Moderate Complexity: 1 Mod G-Codes:     [name], MS OTR/L  Pager: Lorenzo A Talibah Colasurdo 07/31/2017, 11:27 AM

## 2017-07-31 NOTE — Progress Notes (Signed)
Patient alert and oriented, mae's well, voiding adequate amount of urine, swallowing without difficulty, no c/o pain at time of discharge. Patient discharged home with family. Script and discharged instructions given to patient. Patient and family stated understanding of instructions given. Patient has an appointment with Dr.Stern    

## 2017-08-01 NOTE — Anesthesia Postprocedure Evaluation (Signed)
Anesthesia Post Note  Patient: Nathan Perkins  Procedure(s) Performed: MICRODISCECTOMY RIGHT LUMBAR FIVE- SACRAL ONE (N/A Spine Lumbar)     Patient location during evaluation: PACU Anesthesia Type: General Level of consciousness: awake and alert Pain management: pain level controlled Vital Signs Assessment: post-procedure vital signs reviewed and stable Respiratory status: spontaneous breathing, nonlabored ventilation and respiratory function stable Cardiovascular status: blood pressure returned to baseline and stable Postop Assessment: no apparent nausea or vomiting Anesthetic complications: no    Last Vitals:  Vitals:   07/31/17 0309 07/31/17 0747  BP: 120/81 131/84  Pulse: 90 (!) 102  Resp: 16 20  Temp: 36.8 C 36.8 C  SpO2: 98% 95%    Last Pain:  Vitals:   07/31/17 0747  TempSrc: Oral  PainSc:                  Duanne Duchesne,W. EDMOND

## 2017-08-02 ENCOUNTER — Encounter (HOSPITAL_COMMUNITY): Payer: Self-pay | Admitting: Neurosurgery

## 2017-08-02 MED FILL — Thrombin For Soln 5000 Unit: CUTANEOUS | Qty: 5000 | Status: AC

## 2018-01-05 ENCOUNTER — Other Ambulatory Visit: Payer: Self-pay | Admitting: Dermatology

## 2019-01-05 DIAGNOSIS — U071 COVID-19: Secondary | ICD-10-CM

## 2019-01-05 HISTORY — DX: COVID-19: U07.1

## 2019-01-24 ENCOUNTER — Emergency Department (HOSPITAL_COMMUNITY): Payer: Commercial Managed Care - PPO

## 2019-01-24 ENCOUNTER — Inpatient Hospital Stay (HOSPITAL_COMMUNITY)
Admission: EM | Admit: 2019-01-24 | Discharge: 2019-02-07 | DRG: 177 | Disposition: A | Discharge status: Home Health | Payer: Commercial Managed Care - PPO | Attending: Internal Medicine | Admitting: Internal Medicine

## 2019-01-24 ENCOUNTER — Encounter (HOSPITAL_COMMUNITY): Payer: Self-pay | Admitting: Emergency Medicine

## 2019-01-24 ENCOUNTER — Other Ambulatory Visit: Payer: Self-pay

## 2019-01-24 DIAGNOSIS — R0602 Shortness of breath: Secondary | ICD-10-CM

## 2019-01-24 DIAGNOSIS — Z981 Arthrodesis status: Secondary | ICD-10-CM

## 2019-01-24 DIAGNOSIS — Z794 Long term (current) use of insulin: Secondary | ICD-10-CM

## 2019-01-24 DIAGNOSIS — Z888 Allergy status to other drugs, medicaments and biological substances status: Secondary | ICD-10-CM

## 2019-01-24 DIAGNOSIS — R040 Epistaxis: Secondary | ICD-10-CM | POA: Diagnosis not present

## 2019-01-24 DIAGNOSIS — Z91013 Allergy to seafood: Secondary | ICD-10-CM

## 2019-01-24 DIAGNOSIS — U071 COVID-19: Secondary | ICD-10-CM

## 2019-01-24 DIAGNOSIS — J9601 Acute respiratory failure with hypoxia: Secondary | ICD-10-CM | POA: Diagnosis present

## 2019-01-24 DIAGNOSIS — Z885 Allergy status to narcotic agent status: Secondary | ICD-10-CM

## 2019-01-24 DIAGNOSIS — E1165 Type 2 diabetes mellitus with hyperglycemia: Secondary | ICD-10-CM | POA: Diagnosis not present

## 2019-01-24 DIAGNOSIS — Z85828 Personal history of other malignant neoplasm of skin: Secondary | ICD-10-CM

## 2019-01-24 DIAGNOSIS — Y92230 Patient room in hospital as the place of occurrence of the external cause: Secondary | ICD-10-CM | POA: Diagnosis not present

## 2019-01-24 DIAGNOSIS — R7401 Elevation of levels of liver transaminase levels: Secondary | ICD-10-CM | POA: Diagnosis present

## 2019-01-24 DIAGNOSIS — T380X5A Adverse effect of glucocorticoids and synthetic analogues, initial encounter: Secondary | ICD-10-CM | POA: Diagnosis not present

## 2019-01-24 DIAGNOSIS — Z882 Allergy status to sulfonamides status: Secondary | ICD-10-CM

## 2019-01-24 DIAGNOSIS — F419 Anxiety disorder, unspecified: Secondary | ICD-10-CM | POA: Diagnosis not present

## 2019-01-24 DIAGNOSIS — Z87891 Personal history of nicotine dependence: Secondary | ICD-10-CM

## 2019-01-24 DIAGNOSIS — I10 Essential (primary) hypertension: Secondary | ICD-10-CM | POA: Diagnosis present

## 2019-01-24 DIAGNOSIS — Z79899 Other long term (current) drug therapy: Secondary | ICD-10-CM

## 2019-01-24 DIAGNOSIS — J9600 Acute respiratory failure, unspecified whether with hypoxia or hypercapnia: Secondary | ICD-10-CM

## 2019-01-24 DIAGNOSIS — J1289 Other viral pneumonia: Secondary | ICD-10-CM | POA: Diagnosis present

## 2019-01-24 DIAGNOSIS — Z9049 Acquired absence of other specified parts of digestive tract: Secondary | ICD-10-CM

## 2019-01-24 DIAGNOSIS — Z7982 Long term (current) use of aspirin: Secondary | ICD-10-CM

## 2019-01-24 LAB — LACTIC ACID, PLASMA: Lactic Acid, Venous: 1.4 mmol/L (ref 0.5–1.9)

## 2019-01-24 LAB — D-DIMER, QUANTITATIVE: D-Dimer, Quant: 0.8 ug/mL-FEU — ABNORMAL HIGH (ref 0.00–0.50)

## 2019-01-24 LAB — COMPREHENSIVE METABOLIC PANEL
ALT: 29 U/L (ref 0–44)
AST: 52 U/L — ABNORMAL HIGH (ref 15–41)
Albumin: 3.6 g/dL (ref 3.5–5.0)
Alkaline Phosphatase: 41 U/L (ref 38–126)
Anion gap: 10 (ref 5–15)
BUN: 21 mg/dL (ref 8–23)
CO2: 23 mmol/L (ref 22–32)
Calcium: 8.3 mg/dL — ABNORMAL LOW (ref 8.9–10.3)
Chloride: 98 mmol/L (ref 98–111)
Creatinine, Ser: 1.05 mg/dL (ref 0.61–1.24)
GFR calc Af Amer: >60 mL/min (ref >60)
GFR calc non Af Amer: >60 mL/min (ref >60)
Glucose, Bld: 306 mg/dL — ABNORMAL HIGH (ref 70–99)
Potassium: 4 mmol/L (ref 3.5–5.1)
Sodium: 131 mmol/L — ABNORMAL LOW (ref 135–145)
Total Bilirubin: 0.5 mg/dL (ref 0.3–1.2)
Total Protein: 7.2 g/dL (ref 6.5–8.1)

## 2019-01-24 LAB — C-REACTIVE PROTEIN: CRP: 17.3 mg/dL — ABNORMAL HIGH (ref <1.0)

## 2019-01-24 LAB — TRIGLYCERIDES: Triglycerides: 98 mg/dL (ref <150)

## 2019-01-24 LAB — CBC WITH DIFFERENTIAL/PLATELET
Abs Immature Granulocytes: 0.02 10*3/uL (ref 0.00–0.07)
Basophils Absolute: 0 10*3/uL (ref 0.0–0.1)
Basophils Relative: 0 %
Eosinophils Absolute: 0 10*3/uL (ref 0.0–0.5)
Eosinophils Relative: 0 %
HCT: 44.2 % (ref 39.0–52.0)
Hemoglobin: 14.5 g/dL (ref 13.0–17.0)
Immature Granulocytes: 0 %
Lymphocytes Relative: 17 %
Lymphs Abs: 0.9 10*3/uL (ref 0.7–4.0)
MCH: 27.1 pg (ref 26.0–34.0)
MCHC: 32.8 g/dL (ref 30.0–36.0)
MCV: 82.5 fL (ref 80.0–100.0)
Monocytes Absolute: 0.4 10*3/uL (ref 0.1–1.0)
Monocytes Relative: 8 %
Neutro Abs: 3.8 10*3/uL (ref 1.7–7.7)
Neutrophils Relative %: 75 %
Platelets: 199 10*3/uL (ref 150–400)
RBC: 5.36 MIL/uL (ref 4.22–5.81)
RDW: 12.8 % (ref 11.5–15.5)
WBC: 5.1 10*3/uL (ref 4.0–10.5)
nRBC: 0 % (ref 0.0–0.2)

## 2019-01-24 LAB — SARS CORONAVIRUS 2 BY RT PCR (HOSPITAL ORDER, PERFORMED IN ~~LOC~~ HOSPITAL LAB): SARS Coronavirus 2: POSITIVE — AB

## 2019-01-24 LAB — PROCALCITONIN: Procalcitonin: 0.25 ng/mL

## 2019-01-24 LAB — LACTATE DEHYDROGENASE: LDH: 340 U/L — ABNORMAL HIGH (ref 98–192)

## 2019-01-24 LAB — FIBRINOGEN: Fibrinogen: 686 mg/dL — ABNORMAL HIGH (ref 210–475)

## 2019-01-24 LAB — FERRITIN: Ferritin: 456 ng/mL — ABNORMAL HIGH (ref 24–336)

## 2019-01-24 MED ORDER — ACETAMINOPHEN 325 MG PO TABS
650.0000 mg | ORAL_TABLET | Freq: Once | ORAL | Status: AC
  Administered 2019-01-24: 650 mg via ORAL
  Filled 2019-01-24: qty 2

## 2019-01-24 MED ORDER — ALBUTEROL SULFATE HFA 108 (90 BASE) MCG/ACT IN AERS
6.0000 | INHALATION_SPRAY | RESPIRATORY_TRACT | Status: AC | PRN
  Administered 2019-01-28 – 2019-02-02 (×3): 6 via RESPIRATORY_TRACT
  Filled 2019-01-24: qty 6.7

## 2019-01-24 MED ORDER — DEXAMETHASONE SODIUM PHOSPHATE 10 MG/ML IJ SOLN
10.0000 mg | Freq: Once | INTRAMUSCULAR | Status: AC
  Administered 2019-01-24: 10 mg via INTRAVENOUS
  Filled 2019-01-24: qty 1

## 2019-01-24 MED ORDER — ALBUTEROL SULFATE HFA 108 (90 BASE) MCG/ACT IN AERS
4.0000 | INHALATION_SPRAY | Freq: Once | RESPIRATORY_TRACT | Status: AC
  Administered 2019-01-24: 4 via RESPIRATORY_TRACT
  Filled 2019-01-24: qty 6.7

## 2019-01-24 NOTE — ED Triage Notes (Signed)
Patient has been having COVID symptoms for 1 week, seen at [facility] on yesterday, diagnosis COVID related pneumonia, sent to outpatient testing site for COVID today, results pending, has had increased shortness of breath today, oxygen sats in 80s on room air.  Currently SOB, loss of taste and smell, body aches, low-grade fever.

## 2019-01-24 NOTE — ED Notes (Signed)
Date and time results received: 01/24/19 2347 (use smartphrase ".now" to insert current time)  Test: Covid Critical Value: positive  Name of Provider Notified: Kathrynn Humble   Orders Received? Or Actions Taken?: na

## 2019-01-25 ENCOUNTER — Encounter (HOSPITAL_COMMUNITY): Payer: Self-pay

## 2019-01-25 DIAGNOSIS — R7401 Elevation of levels of liver transaminase levels: Secondary | ICD-10-CM | POA: Diagnosis present

## 2019-01-25 DIAGNOSIS — E1165 Type 2 diabetes mellitus with hyperglycemia: Secondary | ICD-10-CM | POA: Diagnosis not present

## 2019-01-25 DIAGNOSIS — R040 Epistaxis: Secondary | ICD-10-CM | POA: Diagnosis not present

## 2019-01-25 DIAGNOSIS — Y92230 Patient room in hospital as the place of occurrence of the external cause: Secondary | ICD-10-CM | POA: Diagnosis not present

## 2019-01-25 DIAGNOSIS — Z888 Allergy status to other drugs, medicaments and biological substances status: Secondary | ICD-10-CM | POA: Diagnosis not present

## 2019-01-25 DIAGNOSIS — Z885 Allergy status to narcotic agent status: Secondary | ICD-10-CM | POA: Diagnosis not present

## 2019-01-25 DIAGNOSIS — J9600 Acute respiratory failure, unspecified whether with hypoxia or hypercapnia: Secondary | ICD-10-CM | POA: Diagnosis not present

## 2019-01-25 DIAGNOSIS — R0602 Shortness of breath: Secondary | ICD-10-CM | POA: Diagnosis not present

## 2019-01-25 DIAGNOSIS — Z981 Arthrodesis status: Secondary | ICD-10-CM | POA: Diagnosis not present

## 2019-01-25 DIAGNOSIS — J1282 Pneumonia due to coronavirus disease 2019: Secondary | ICD-10-CM | POA: Diagnosis present

## 2019-01-25 DIAGNOSIS — Z85828 Personal history of other malignant neoplasm of skin: Secondary | ICD-10-CM | POA: Diagnosis not present

## 2019-01-25 DIAGNOSIS — F419 Anxiety disorder, unspecified: Secondary | ICD-10-CM | POA: Diagnosis not present

## 2019-01-25 DIAGNOSIS — Z79899 Other long term (current) drug therapy: Secondary | ICD-10-CM | POA: Diagnosis not present

## 2019-01-25 DIAGNOSIS — J1289 Other viral pneumonia: Principal | ICD-10-CM | POA: Diagnosis present

## 2019-01-25 DIAGNOSIS — Z882 Allergy status to sulfonamides status: Secondary | ICD-10-CM | POA: Diagnosis not present

## 2019-01-25 DIAGNOSIS — T380X5A Adverse effect of glucocorticoids and synthetic analogues, initial encounter: Secondary | ICD-10-CM | POA: Diagnosis not present

## 2019-01-25 DIAGNOSIS — I10 Essential (primary) hypertension: Secondary | ICD-10-CM | POA: Diagnosis present

## 2019-01-25 DIAGNOSIS — Z91013 Allergy to seafood: Secondary | ICD-10-CM | POA: Diagnosis not present

## 2019-01-25 DIAGNOSIS — U071 COVID-19: Secondary | ICD-10-CM | POA: Diagnosis present

## 2019-01-25 DIAGNOSIS — Z9049 Acquired absence of other specified parts of digestive tract: Secondary | ICD-10-CM | POA: Diagnosis not present

## 2019-01-25 DIAGNOSIS — Z7982 Long term (current) use of aspirin: Secondary | ICD-10-CM | POA: Diagnosis not present

## 2019-01-25 DIAGNOSIS — R7989 Other specified abnormal findings of blood chemistry: Secondary | ICD-10-CM | POA: Diagnosis not present

## 2019-01-25 DIAGNOSIS — Z87891 Personal history of nicotine dependence: Secondary | ICD-10-CM | POA: Diagnosis not present

## 2019-01-25 DIAGNOSIS — Z794 Long term (current) use of insulin: Secondary | ICD-10-CM | POA: Diagnosis not present

## 2019-01-25 DIAGNOSIS — J9601 Acute respiratory failure with hypoxia: Secondary | ICD-10-CM | POA: Diagnosis present

## 2019-01-25 LAB — CBC
HCT: 44.5 % (ref 39.0–52.0)
Hemoglobin: 14.2 g/dL (ref 13.0–17.0)
MCH: 27 pg (ref 26.0–34.0)
MCHC: 31.9 g/dL (ref 30.0–36.0)
MCV: 84.6 fL (ref 80.0–100.0)
Platelets: 210 10*3/uL (ref 150–400)
RBC: 5.26 MIL/uL (ref 4.22–5.81)
RDW: 12.6 % (ref 11.5–15.5)
WBC: 4.2 10*3/uL (ref 4.0–10.5)
nRBC: 0 % (ref 0.0–0.2)

## 2019-01-25 LAB — CBG MONITORING, ED
Glucose-Capillary: 377 mg/dL — ABNORMAL HIGH (ref 70–99)
Glucose-Capillary: 368 mg/dL — ABNORMAL HIGH (ref 70–99)

## 2019-01-25 LAB — COMPREHENSIVE METABOLIC PANEL
ALT: 27 U/L (ref 0–44)
AST: 44 U/L — ABNORMAL HIGH (ref 15–41)
Albumin: 3.2 g/dL — ABNORMAL LOW (ref 3.5–5.0)
Alkaline Phosphatase: 41 U/L (ref 38–126)
Anion gap: 15 (ref 5–15)
BUN: 27 mg/dL — ABNORMAL HIGH (ref 8–23)
CO2: 23 mmol/L (ref 22–32)
Calcium: 8.7 mg/dL — ABNORMAL LOW (ref 8.9–10.3)
Chloride: 100 mmol/L (ref 98–111)
Creatinine, Ser: 1.02 mg/dL (ref 0.61–1.24)
GFR calc Af Amer: >60 mL/min (ref >60)
GFR calc non Af Amer: >60 mL/min (ref >60)
Glucose, Bld: 356 mg/dL — ABNORMAL HIGH (ref 70–99)
Potassium: 4.8 mmol/L (ref 3.5–5.1)
Sodium: 138 mmol/L (ref 135–145)
Total Bilirubin: 0.9 mg/dL (ref 0.3–1.2)
Total Protein: 6.6 g/dL (ref 6.5–8.1)

## 2019-01-25 LAB — D-DIMER, QUANTITATIVE: D-Dimer, Quant: 0.68 ug/mL-FEU — ABNORMAL HIGH (ref 0.00–0.50)

## 2019-01-25 LAB — C-REACTIVE PROTEIN: CRP: 17.3 mg/dL — ABNORMAL HIGH (ref <1.0)

## 2019-01-25 LAB — GLUCOSE, CAPILLARY
Glucose-Capillary: 381 mg/dL — ABNORMAL HIGH (ref 70–99)
Glucose-Capillary: 321 mg/dL — ABNORMAL HIGH (ref 70–99)
Glucose-Capillary: 327 mg/dL — ABNORMAL HIGH (ref 70–99)

## 2019-01-25 LAB — HIV ANTIBODY (ROUTINE TESTING W REFLEX): HIV Screen 4th Generation wRfx: NONREACTIVE

## 2019-01-25 LAB — HEMOGLOBIN A1C
Hgb A1c MFr Bld: 8.6 % — ABNORMAL HIGH (ref 4.8–5.6)
Mean Plasma Glucose: 200.12 mg/dL

## 2019-01-25 LAB — ABO/RH: ABO/RH(D): A POS

## 2019-01-25 LAB — BRAIN NATRIURETIC PEPTIDE: B Natriuretic Peptide: 82 pg/mL (ref 0.0–100.0)

## 2019-01-25 LAB — FERRITIN: Ferritin: 472 ng/mL — ABNORMAL HIGH (ref 24–336)

## 2019-01-25 MED ORDER — INSULIN DETEMIR 100 UNIT/ML ~~LOC~~ SOLN
30.0000 [IU] | Freq: Two times a day (BID) | SUBCUTANEOUS | Status: DC
  Filled 2019-01-25: qty 0.3

## 2019-01-25 MED ORDER — HYDROCOD POLST-CPM POLST ER 10-8 MG/5ML PO SUER
5.0000 mL | Freq: Two times a day (BID) | ORAL | Status: DC | PRN
  Administered 2019-01-25 – 2019-02-03 (×8): 5 mL via ORAL
  Filled 2019-01-25 (×8): qty 5

## 2019-01-25 MED ORDER — VITAMIN C 500 MG PO TABS
1000.0000 mg | ORAL_TABLET | Freq: Every day | ORAL | Status: DC
  Administered 2019-01-25 – 2019-02-07 (×14): 1000 mg via ORAL
  Filled 2019-01-25 (×14): qty 2

## 2019-01-25 MED ORDER — SODIUM CHLORIDE 0.9 % IV SOLN
INTRAVENOUS | Status: DC
  Administered 2019-01-25: 15:00:00 via INTRAVENOUS

## 2019-01-25 MED ORDER — INSULIN ASPART 100 UNIT/ML ~~LOC~~ SOLN
0.0000 [IU] | Freq: Every day | SUBCUTANEOUS | Status: DC
  Administered 2019-01-25: 4 [IU] via SUBCUTANEOUS
  Administered 2019-01-26 – 2019-01-27 (×2): 3 [IU] via SUBCUTANEOUS
  Administered 2019-01-29: 2 [IU] via SUBCUTANEOUS
  Administered 2019-01-31 – 2019-02-01 (×2): 4 [IU] via SUBCUTANEOUS

## 2019-01-25 MED ORDER — ONDANSETRON HCL 4 MG PO TABS: 4.0000 mg | ORAL_TABLET | Freq: Four times a day (QID) | ORAL | Status: DC | PRN

## 2019-01-25 MED ORDER — INSULIN ASPART 100 UNIT/ML ~~LOC~~ SOLN
0.0000 [IU] | Freq: Three times a day (TID) | SUBCUTANEOUS | Status: DC
  Administered 2019-01-25: 15 [IU] via SUBCUTANEOUS
  Administered 2019-01-25 – 2019-01-26 (×3): 20 [IU] via SUBCUTANEOUS
  Administered 2019-01-26: 15 [IU] via SUBCUTANEOUS
  Administered 2019-01-27: 7 [IU] via SUBCUTANEOUS
  Administered 2019-01-27 (×2): 20 [IU] via SUBCUTANEOUS
  Administered 2019-01-28: 11 [IU] via SUBCUTANEOUS
  Administered 2019-01-28: 4 [IU] via SUBCUTANEOUS
  Administered 2019-01-28 – 2019-01-29 (×3): 7 [IU] via SUBCUTANEOUS
  Administered 2019-01-29: 3 [IU] via SUBCUTANEOUS
  Administered 2019-01-30: 7 [IU] via SUBCUTANEOUS
  Administered 2019-01-30 – 2019-01-31 (×2): 4 [IU] via SUBCUTANEOUS
  Administered 2019-01-31 – 2019-02-01 (×2): 11 [IU] via SUBCUTANEOUS
  Administered 2019-02-01: 11:00:00 15 [IU] via SUBCUTANEOUS

## 2019-01-25 MED ORDER — SODIUM CHLORIDE 0.9 % IV SOLN
100.0000 mg | INTRAVENOUS | Status: AC
  Administered 2019-01-26 – 2019-01-29 (×4): 100 mg via INTRAVENOUS
  Filled 2019-01-25 (×4): qty 20

## 2019-01-25 MED ORDER — SODIUM CHLORIDE 0.9 % IV SOLN
200.0000 mg | Freq: Once | INTRAVENOUS | Status: AC
  Administered 2019-01-25: 200 mg via INTRAVENOUS
  Filled 2019-01-25: qty 40

## 2019-01-25 MED ORDER — METHYLPREDNISOLONE SODIUM SUCC 125 MG IJ SOLR
60.0000 mg | Freq: Two times a day (BID) | INTRAMUSCULAR | Status: DC
  Administered 2019-01-25 – 2019-01-30 (×10): 60 mg via INTRAVENOUS
  Filled 2019-01-25 (×10): qty 2

## 2019-01-25 MED ORDER — ASPIRIN 81 MG PO CHEW
81.0000 mg | CHEWABLE_TABLET | Freq: Every day | ORAL | Status: DC
  Administered 2019-01-25 – 2019-02-07 (×14): 81 mg via ORAL
  Filled 2019-01-25 (×14): qty 1

## 2019-01-25 MED ORDER — BENZONATATE 100 MG PO CAPS
200.0000 mg | ORAL_CAPSULE | Freq: Three times a day (TID) | ORAL | Status: DC
  Administered 2019-01-25 – 2019-02-07 (×39): 200 mg via ORAL
  Filled 2019-01-25 (×39): qty 2

## 2019-01-25 MED ORDER — AMLODIPINE BESYLATE 5 MG PO TABS
5.0000 mg | ORAL_TABLET | Freq: Every day | ORAL | Status: DC
  Administered 2019-01-25 – 2019-02-07 (×14): 5 mg via ORAL
  Filled 2019-01-25 (×14): qty 1

## 2019-01-25 MED ORDER — ALBUTEROL SULFATE HFA 108 (90 BASE) MCG/ACT IN AERS
2.0000 | INHALATION_SPRAY | Freq: Four times a day (QID) | RESPIRATORY_TRACT | Status: DC
  Administered 2019-01-25 – 2019-02-07 (×49): 2 via RESPIRATORY_TRACT
  Filled 2019-01-25: qty 6.7

## 2019-01-25 MED ORDER — INSULIN ASPART 100 UNIT/ML ~~LOC~~ SOLN
0.0000 [IU] | Freq: Three times a day (TID) | SUBCUTANEOUS | Status: DC
  Administered 2019-01-25: 9 [IU] via SUBCUTANEOUS
  Filled 2019-01-25: qty 1

## 2019-01-25 MED ORDER — ONDANSETRON HCL 4 MG/2ML IJ SOLN
4.0000 mg | Freq: Four times a day (QID) | INTRAMUSCULAR | Status: DC | PRN
  Administered 2019-01-26: 4 mg via INTRAVENOUS
  Filled 2019-01-25: qty 2

## 2019-01-25 MED ORDER — ZINC SULFATE 220 (50 ZN) MG PO CAPS
220.0000 mg | ORAL_CAPSULE | Freq: Every day | ORAL | Status: DC
  Administered 2019-01-25 – 2019-02-07 (×14): 220 mg via ORAL
  Filled 2019-01-25 (×14): qty 1

## 2019-01-25 MED ORDER — LABETALOL HCL 5 MG/ML IV SOLN: 10.0000 mg | INTRAVENOUS | Status: DC | PRN

## 2019-01-25 MED ORDER — INSULIN DETEMIR 100 UNIT/ML ~~LOC~~ SOLN
15.0000 [IU] | Freq: Two times a day (BID) | SUBCUTANEOUS | Status: DC
  Filled 2019-01-25 (×3): qty 0.15

## 2019-01-25 MED ORDER — INSULIN ASPART 100 UNIT/ML ~~LOC~~ SOLN
8.0000 [IU] | Freq: Three times a day (TID) | SUBCUTANEOUS | Status: DC
  Administered 2019-01-25 – 2019-01-26 (×2): 8 [IU] via SUBCUTANEOUS

## 2019-01-25 MED ORDER — INSULIN NPH (HUMAN) (ISOPHANE) 100 UNIT/ML ~~LOC~~ SUSP
30.0000 [IU] | Freq: Two times a day (BID) | SUBCUTANEOUS | Status: DC
  Filled 2019-01-25: qty 10

## 2019-01-25 MED ORDER — ACETAMINOPHEN 325 MG PO TABS
650.0000 mg | ORAL_TABLET | Freq: Once | ORAL | Status: AC
  Administered 2019-01-28: 650 mg via ORAL
  Filled 2019-01-25: qty 2

## 2019-01-25 MED ORDER — FUROSEMIDE 10 MG/ML IJ SOLN
20.0000 mg | Freq: Once | INTRAMUSCULAR | Status: AC
  Administered 2019-01-26: 20 mg via INTRAVENOUS
  Filled 2019-01-25: qty 2

## 2019-01-25 MED ORDER — TOCILIZUMAB 400 MG/20ML IV SOLN
8.0000 mg/kg | Freq: Once | INTRAVENOUS | Status: AC
  Administered 2019-01-25: 720 mg via INTRAVENOUS
  Filled 2019-01-25: qty 36

## 2019-01-25 MED ORDER — DEXAMETHASONE 6 MG PO TABS: 6.0000 mg | ORAL_TABLET | ORAL | Status: DC

## 2019-01-25 MED ORDER — GUAIFENESIN ER 600 MG PO TB12
600.0000 mg | ORAL_TABLET | Freq: Two times a day (BID) | ORAL | Status: DC
  Administered 2019-01-25 – 2019-02-07 (×27): 600 mg via ORAL
  Filled 2019-01-25 (×27): qty 1

## 2019-01-25 MED ORDER — ENSURE ENLIVE PO LIQD
237.0000 mL | Freq: Two times a day (BID) | ORAL | Status: DC
  Administered 2019-01-25: 237 mL via ORAL

## 2019-01-25 MED ORDER — LOPERAMIDE HCL 2 MG PO CAPS: 2.0000 mg | ORAL_CAPSULE | ORAL | Status: DC | PRN

## 2019-01-25 MED ORDER — DIPHENHYDRAMINE HCL 50 MG/ML IJ SOLN: 25.0000 mg | Freq: Once | INTRAMUSCULAR | Status: DC

## 2019-01-25 MED ORDER — SODIUM CHLORIDE 0.9% IV SOLUTION: Freq: Once | INTRAVENOUS | Status: DC

## 2019-01-25 MED ORDER — PANTOPRAZOLE SODIUM 40 MG PO TBEC
40.0000 mg | DELAYED_RELEASE_TABLET | Freq: Every day | ORAL | Status: DC
  Administered 2019-01-25 – 2019-02-07 (×14): 40 mg via ORAL
  Filled 2019-01-25 (×14): qty 1

## 2019-01-25 MED ORDER — ENOXAPARIN SODIUM 40 MG/0.4ML ~~LOC~~ SOLN
40.0000 mg | SUBCUTANEOUS | Status: DC
  Administered 2019-01-25 – 2019-01-27 (×3): 40 mg via SUBCUTANEOUS
  Filled 2019-01-25 (×3): qty 0.4

## 2019-01-25 MED ORDER — INSULIN DETEMIR 100 UNIT/ML ~~LOC~~ SOLN
30.0000 [IU] | Freq: Two times a day (BID) | SUBCUTANEOUS | Status: DC
  Administered 2019-01-25 – 2019-01-26 (×3): 30 [IU] via SUBCUTANEOUS
  Filled 2019-01-25 (×3): qty 0.3

## 2019-01-25 MED ORDER — METHYLPREDNISOLONE SODIUM SUCC 125 MG IJ SOLR
60.0000 mg | Freq: Two times a day (BID) | INTRAMUSCULAR | Status: DC
  Administered 2019-01-25: 60 mg via INTRAVENOUS
  Filled 2019-01-25: qty 2

## 2019-01-25 NOTE — Progress Notes (Signed)
Remdesivir - Pharmacy Brief Note   O:  ALT: 27 CXR: Worsening bilateral interstitial and airspace opacities throughout both lungs SpO2: 89% on 5L [place]   A/P:  Remdesivir 200 mg once followed by 100 mg daily x 4 days.   Gretta Arab PharmD, BCPS Clinical pharmacist phone 7am- 5pm: [phone] 01/25/2019 2:43 PM

## 2019-01-25 NOTE — H&P (Signed)
TRH H&P    Patient Demographics:    Nathan Perkins, is a 63 y.o. male  MRN: [id number]  DOB - [birth year]  Admit Date - 01/24/2019  Referring MD/NP/PA: Dr. Kathrynn Humble  Outpatient Primary MD for the patient is Monico Blitz, MD  Patient coming from: Home  Chief complaint-shortness of breath   HPI:    Nathan Perkins  is a 63 y.o. male, with history of diabetes mellitus type 2, hypertension came to hospital with worsening shortness of breath.  O2 sats dropping to 80% on room air.  Patient says that he has been having symptoms of shortness of breath for 1 week, was seen at [facility] yesterday and was diagnosed with Covid related pneumonia.  He was sent to outpatient testing site for Covid today.  Underwent testing but results are currently pending. He complains of cough but not coughing up any phlegm. Denies chest pain Denies abdominal pain or dysuria. Denies nausea vomiting or diarrhea. Complains of intermittent fever of 101 at home. In the ED SARS-CoV-2 test was positive.  Chest x-ray shows multifocal pneumonia consistent with atypical viral infection. LDH 340, triglyceride 98, ferritin 436, CRP 17.3, lactic acid 1.4, procalcitonin 0.25    Review of systems:    In addition to the HPI above,    All other systems reviewed and are negative.    Past History of the following :    Past Medical History:  Diagnosis Date  . Cancer ([place])    skin cancer face- basal  . Complication of anesthesia   . Diabetes ([place])    type 2 x 5 yrs  . Hypertension    since age 54  . PONV (postoperative nausea and vomiting) 06/25/2017      Past Surgical History:  Procedure Laterality Date  . APPENDECTOMY    . CARDIAC CATHETERIZATION N/A 01/07/2015   Procedure: Left Heart Cath and Coronary Angiography;  Surgeon: Burnell Blanks, MD;  Location: [place] [facility];  Service: Cardiovascular;  Laterality: N/A;   . CERVICAL DISC SURGERY     with a plate   . CHOLECYSTECTOMY    . COLONOSCOPY N/A 03/15/2013   Procedure: COLONOSCOPY;  Surgeon: Rogene Houston, MD;  [facility];  Service: Endoscopy;  Laterality: N/A;  245  . cyst removal of neck    . KNEE ARTHROSCOPY Right   . LUMBAR DISC SURGERY  06/25/2017   lumbar 5  . LUMBAR LAMINECTOMY/DECOMPRESSION MICRODISCECTOMY N/A 07/30/2017   Procedure: MICRODISCECTOMY RIGHT LUMBAR FIVE- SACRAL ONE;  Surgeon: Erline Levine, MD;  Location: [place];  Service: Neurosurgery;  Laterality: N/A;  MICRODISCECTOMY RIGHT LUMBAR 5- SACRAL 1      Social History:      Social History   Tobacco Use  . Smoking status: Former Smoker    Packs/day: 2.00    Years: 32.00    Pack years: 64.00    Types: Cigarettes    Start date: 01/02/1969    Quit date: 07/03/1999    Years since quitting: 19.5  . Smokeless tobacco: Former Systems developer  Types: Chew  Substance Use Topics  . Alcohol use: No    Alcohol/week: 0.0 standard drinks       Family History :   No family history of cancer   Home Medications:   Prior to Admission medications   Medication Sig Start Date End Date Taking? Authorizing Provider  amLODipine (NORVASC) 5 MG tablet Take 5 mg by mouth daily.   Yes [provider]  aspirin 81 MG tablet Take 81 mg by mouth daily.   Yes [provider]  Flaxseed, Linseed, (FLAXSEED OIL) 1200 MG CAPS Take 1 capsule by mouth daily.    Yes [provider]  HUMALOG KWIKPEN 100 UNIT/ML KiwkPen Inject 4-10 Units into the muscle 3 (three) times daily as needed (Sliding scale).  07/26/17  Yes [provider]  ibuprofen (ADVIL,MOTRIN) 200 MG tablet Take 800 mg by mouth every 6 (six) hours as needed.   Yes [provider]  lisinopril (PRINIVIL,ZESTRIL) 40 MG tablet Take 40 mg by mouth daily.  12/13/14  Yes [provider]  NOVOLIN N RELION 100 UNIT/ML injection Inject 30-40 Units into the skin See admin instructions. 40 units  in the morning, and 40 units in the evening (evening dose depends on blood sugar levels) 11/06/14  Yes [provider]  omeprazole (PRILOSEC) 20 MG capsule Take 20 mg by mouth 2 (two) times daily before a meal.   Yes [provider]  Vitamin D, Ergocalciferol, (DRISDOL) 50000 UNITS CAPS capsule Take 1 capsule by mouth every Sunday.  12/11/14  Yes [provider]     Allergies:     Allergies  Allergen Reactions  . Invokana [Canagliflozin] Other (See Comments)    KETOACIDOSIS   . Percocet [Oxycodone-Acetaminophen] Itching  . Shellfish Allergy     Shrimp - possible Iodine allergy UNSPECIFIED REACTION   . Sulfa Antibiotics     UNSPECIFIED REACTION      Physical Exam:   Vitals  Blood pressure 123/73, pulse 86, temperature 99 F (37.2 C), temperature source Oral, resp. rate (!) 22, height 6' (1.829 m), weight 93 kg, SpO2 92 %.  1.  General: Appears in no acute distress  2. Psychiatric: Alert, oriented x3, intact insight and judgment  3. Neurologic: Cranial nerves II to 12 grossly intact, moving all extremities  4. HEENMT:  Atraumatic normocephalic, extraocular muscles are intact  5. Respiratory : Bilateral crackles auscultated at lung bases  6. Cardiovascular : S1-S2, regular, no murmur auscultated  7. Gastrointestinal:  Abdomen is soft, nontender, no organomegaly      Data Review:    CBC Recent Labs  Lab 01/24/19 2136  WBC 5.1  HGB 14.5  HCT 44.2  PLT 199  MCV 82.5  MCH 27.1  MCHC 32.8  RDW 12.8  LYMPHSABS 0.9  MONOABS 0.4  EOSABS 0.0  BASOSABS 0.0   ------------------------------------------------------------------------------------------------------------------  Results for orders placed or performed during the hospital encounter of 01/24/19 (from the past 48 hour(s))  Lactic acid, plasma     Status: None   Collection Time: 01/24/19  9:36 PM  Result Value Ref Range   Lactic Acid, Venous 1.4 0.5 - 1.9 mmol/L    Comment:  Performed at [facility], [address] [place], [place] [address]  Blood Culture (routine x 2)     Status: None (Preliminary result)   Collection Time: 01/24/19  9:36 PM   Specimen: Vein; Blood  Result Value Ref Range   Specimen Description LEFT ANTECUBITAL    Special Requests  BOTTLES DRAWN AEROBIC AND ANAEROBIC Blood Culture adequate volume Performed at [facility], [address], [place], [place] [address]    Culture PENDING    Report Status PENDING   CBC WITH DIFFERENTIAL     Status: None   Collection Time: 01/24/19  9:36 PM  Result Value Ref Range   WBC 5.1 4.0 - 10.5 K/uL   RBC 5.36 4.22 - 5.81 MIL/uL   Hemoglobin 14.5 13.0 - 17.0 g/dL   HCT 44.2 39.0 - 52.0 %   MCV 82.5 80.0 - 100.0 fL   MCH 27.1 26.0 - 34.0 pg   MCHC 32.8 30.0 - 36.0 g/dL   RDW 12.8 11.5 - 15.5 %   Platelets 199 150 - 400 K/uL   nRBC 0.0 0.0 - 0.2 %   Neutrophils Relative % 75 %   Neutro Abs 3.8 1.7 - 7.7 K/uL   Lymphocytes Relative 17 %   Lymphs Abs 0.9 0.7 - 4.0 K/uL   Monocytes Relative 8 %   Monocytes Absolute 0.4 0.1 - 1.0 K/uL   Eosinophils Relative 0 %   Eosinophils Absolute 0.0 0.0 - 0.5 K/uL   Basophils Relative 0 %   Basophils Absolute 0.0 0.0 - 0.1 K/uL   Immature Granulocytes 0 %   Abs Immature Granulocytes 0.02 0.00 - 0.07 K/uL    Comment: Performed at [facility], [address], [place], [place] [address]  Comprehensive metabolic panel     Status: Abnormal   Collection Time: 01/24/19  9:36 PM  Result Value Ref Range   Sodium 131 (L) 135 - 145 mmol/L   Potassium 4.0 3.5 - 5.1 mmol/L   Chloride 98 98 - 111 mmol/L   CO2 23 22 - 32 mmol/L   Glucose, Bld 306 (H) 70 - 99 mg/dL   BUN 21 8 - 23 mg/dL   Creatinine, Ser 1.05 0.61 - 1.24 mg/dL   Calcium 8.3 (L) 8.9 - 10.3 mg/dL   Total Protein 7.2 6.5 - 8.1 g/dL   Albumin 3.6 3.5 - 5.0 g/dL   AST 52 (H) 15 - 41 U/L   ALT 29 0 - 44 U/L   Alkaline Phosphatase 41 38 - 126 U/L   Total Bilirubin 0.5 0.3 - 1.2 mg/dL   GFR calc non  Af Amer >60 >60 mL/min   GFR calc Af Amer >60 >60 mL/min   Anion gap 10 5 - 15    Comment: Performed at [facility], [address] [place], [place] [address]  D-dimer, quantitative     Status: Abnormal   Collection Time: 01/24/19  9:36 PM  Result Value Ref Range   D-Dimer, Quant 0.80 (H) 0.00 - 0.50 ug/mL-FEU    Comment: (NOTE) At the manufacturer cut-off of 0.50 ug/mL FEU, this assay has been documented to exclude PE with a sensitivity and negative predictive value of 97 to 99%.  At this time, this assay has not been approved by the FDA to exclude DVT/VTE. Results should be correlated with clinical presentation. Performed at [facility], [address], [place], [place] [address]   Procalcitonin     Status: None   Collection Time: 01/24/19  9:36 PM  Result Value Ref Range   Procalcitonin 0.25 ng/mL    Comment:        Interpretation: PCT (Procalcitonin) <= 0.5 ng/mL: Systemic infection (sepsis) is not likely. Local bacterial infection is possible. (NOTE)       Sepsis PCT Algorithm  Lower Respiratory Tract                                      Infection PCT Algorithm    ----------------------------     ----------------------------         PCT < 0.25 ng/mL                PCT < 0.10 ng/mL         Strongly encourage             Strongly discourage   discontinuation of antibiotics    initiation of antibiotics    ----------------------------     -----------------------------       PCT 0.25 - 0.50 ng/mL            PCT 0.10 - 0.25 ng/mL               OR       >80% decrease in PCT            Discourage initiation of                                            antibiotics      Encourage discontinuation           of antibiotics    ----------------------------     -----------------------------         PCT >= 0.50 ng/mL              PCT 0.26 - 0.50 ng/mL               AND        <80% decrease in PCT             Encourage initiation of                                              antibiotics       Encourage continuation           of antibiotics    ----------------------------     -----------------------------        PCT >= 0.50 ng/mL                  PCT > 0.50 ng/mL               AND         increase in PCT                  Strongly encourage                                      initiation of antibiotics    Strongly encourage escalation           of antibiotics                                     -----------------------------  PCT <= 0.25 ng/mL                                                 OR                                        > 80% decrease in PCT                                     Discontinue / Do not initiate                                             antibiotics Performed at [facility], [address], [place], [place] [address]   Lactate dehydrogenase     Status: Abnormal   Collection Time: 01/24/19  9:36 PM  Result Value Ref Range   LDH 340 (H) 98 - 192 U/L    Comment: Performed at [facility], [address], [place], [place] [address]  Ferritin     Status: Abnormal   Collection Time: 01/24/19  9:36 PM  Result Value Ref Range   Ferritin 456 (H) 24 - 336 ng/mL    Comment: Performed at [facility], [address] [place], [place] [address]  Triglycerides     Status: None   Collection Time: 01/24/19  9:36 PM  Result Value Ref Range   Triglycerides 98 <150 mg/dL    Comment: Performed at [facility], [address], [place], [place] [address]  Fibrinogen     Status: Abnormal   Collection Time: 01/24/19  9:36 PM  Result Value Ref Range   Fibrinogen 686 (H) 210 - 475 mg/dL    Comment: Performed at [facility], [address] [place], [place] [address]  C-reactive protein     Status: Abnormal   Collection Time: 01/24/19  9:36 PM  Result Value Ref Range   CRP 17.3 (H) <1.0 mg/dL    Comment: Performed at [facility], [address] [place], [place] [address]  SARS Coronavirus 2 by RT PCR (hospital  order, performed in [facility] hospital lab) Nasopharyngeal Nasopharyngeal Swab     Status: Abnormal   Collection Time: 01/24/19 10:45 PM   Specimen: Nasopharyngeal Swab  Result Value Ref Range   SARS Coronavirus 2 POSITIVE (A) NEGATIVE    Comment: RESULT CALLED TO, READ BACK BY AND VERIFIED WITH: L ANDREW,RN @2345  01/24/19 MKELLY (NOTE) If result is NEGATIVE SARS-CoV-2 target nucleic acids are NOT DETECTED. The SARS-CoV-2 RNA is generally detectable in upper and lower  respiratory specimens during the acute phase of infection. The lowest  concentration of SARS-CoV-2 viral copies this assay can detect is 250  copies / mL. A negative result does not preclude SARS-CoV-2 infection  and should not be used as the sole basis for treatment or other  patient management decisions.  A negative result may occur with  improper specimen collection / handling, submission of specimen other  Nathan nasopharyngeal swab, presence of viral mutation(s) within the  areas targeted by this assay, and inadequate number of viral copies  (<  250 copies / mL). A negative result must be combined with clinical  observations, patient history, and epidemiological information. If result is POSITIVE SARS-CoV-2 target nucleic acids are DETECTED. The  SARS-CoV-2 RNA is generally detectable in upper and lower  respiratory specimens during the acute phase of infection.  Positive  results are indicative of active infection with SARS-CoV-2.  Clinical  correlation with patient history and other diagnostic information is  necessary to determine patient infection status.  Positive results do  not rule out bacterial infection or co-infection with other viruses. If result is PRESUMPTIVE POSTIVE SARS-CoV-2 nucleic acids MAY BE PRESENT.   A presumptive positive result was obtained on the submitted specimen  and confirmed on repeat testing.  While 2019 novel coronavirus  (SARS-CoV-2) nucleic acids may be present in the submitted  sample  additional confirmatory testing may be necessary for epidemiological  and / or clinical management purposes  to differentiate between  SARS-CoV-2 and other Sarbecovirus currently known to infect humans.  If clinically indicated additional testing with an alternate test  methodology [phone]) is a dvised. The SARS-CoV-2 RNA is generally  detectable in upper and lower respiratory specimens during the acute  phase of infection. The expected result is Negative. Fact Sheet for Patients:  StrictlyIdeas.no Fact Sheet for Healthcare Providers: BankingDealers.co.za This test is not yet approved or cleared by the [place] FDA and has been authorized for detection and/or diagnosis of SARS-CoV-2 by FDA under an Emergency Use Authorization (EUA).  This EUA will remain in effect (meaning this test can be used) for the duration of the COVID-19 declaration under Section 564(b)(1) of the Act, 21 U.S.C. section 360bbb-3(b)(1), unless the authorization is terminated or revoked sooner. Performed at [facility], [address], [place], [place] [address]     Chemistries  Recent Labs  Lab 01/24/19 2136  NA 131*  K 4.0  CL 98  CO2 23  GLUCOSE 306*  BUN 21  CREATININE 1.05  CALCIUM 8.3*  AST 52*  ALT 29  ALKPHOS 41  BILITOT 0.5   ------------------------------------------------------------------------------------------------------------------  ------------------------------------------------------------------------------------------------------------------ GFR: Estimated Creatinine Clearance: 80.1 mL/min (by C-G formula based on SCr of 1.05 mg/dL). Liver Function Tests: Recent Labs  Lab 01/24/19 2136  AST 52*  ALT 29  ALKPHOS 41  BILITOT 0.5  PROT 7.2  ALBUMIN 3.6    Recent Labs    01/24/19 2136  TRIG 98   Thyroid Function Tests: No results for input(s): TSH, T4TOTAL, FREET4, T3FREE, THYROIDAB in the last 72 hours.  Anemia Panel: Recent Labs    01/24/19 2136  FERRITIN 456*    --------------------------------------------------------------------------------------------------------------- Urine analysis: No results found for: COLORURINE, APPEARANCEUR, LABSPEC, PHURINE, GLUCOSEU, HGBUR, BILIRUBINUR, KETONESUR, PROTEINUR, UROBILINOGEN, NITRITE, LEUKOCYTESUR    Imaging Results:    Dg Chest Port 1 View  Result Date: 01/24/2019 CLINICAL DATA:  Difficulty breathing, concern for COVID-19 EXAM: PORTABLE CHEST 1 VIEW COMPARISON:  Radiograph 01/23/2019 FINDINGS: Worsening bilateral interstitial and airspace opacities throughout both lungs. Peripheral predominance most notable in the left lung base. Lung volumes are diminished with streaky areas of atelectasis as well. No pneumothorax or visible effusion. Cardiomediastinal contours are stable accounting for differences in technique and lung volumes. Degenerative changes are present in the imaged spine and shoulders. Cervical spinal fusion hardware is incompletely assessed. IMPRESSION: Worsening bilateral interstitial and airspace opacities throughout both lungs, compatible with a multifocal pneumonia such as atypical viral pneumonia including COVID-19. Electronically Signed   By: Lovena Le M.D.   On: 01/24/2019 22:17  My personal review of EKG: Rhythm NSR, no ST changes   Assessment & Plan:    Active Problems:   Pneumonia due to COVID-19 virus   1. Acute hypoxic respiratory failure/pneumonia due to COVID-19 virus-patient is hypoxic with O2 sats 95% on 3 L of oxygen via nasal cannula.  Will start remdesivir per pharmacy consultation, Decadron 10 mg IV given in the ED.  Will start Decadron 6 mg daily from tomorrow morning.  Patient will transfer to [facility]  Will obtain D-dimer, CRP in a.m.  2. Diabetes mellitus type 2-patient takes NPH insulin at home.  Will start NPH insulin 30 units subcu twice daily, also initiate sensitive sliding scale insulin with  NovoLog.  Check CBG before every meal and at bedtime.  3. Hypertension-patient has history of hypertension, BP is soft.  Hold amlodipine and lisinopril at this time.  Consider restarting these medications if BP starts rising.    DVT Prophylaxis-   Lovenox   AM Labs Ordered, also please review Full Orders  Family Communication: Admission, patients condition and plan of care including tests being ordered have been discussed with the patient  who indicate understanding and agree with the plan and Code Status.  Code Status: Full code  Admission status: Inpatient: Based on patients clinical presentation and evaluation of above clinical data, I have made determination that patient meets Inpatient criteria at this time.  Time spent in minutes : 60 minutes   Oswald Hillock M.D on 01/25/2019 at 1:25 AM

## 2019-01-25 NOTE — Progress Notes (Signed)
Notified Dr Sloan Leiter that pts MEWS score was 2 - putting him in yellow catergory - he is slightly tachycardic into low 100s, respiratory rate in mid 20s which has been consistent since admission, 02 sat currenty 87% on 8L which has increased - he had just finished eating. In no acute distress.  MD to enter order for convalescent plasma.  Encouraged pt to prone and to use IS/flutter valve.

## 2019-01-25 NOTE — Progress Notes (Signed)
Inpatient Diabetes Program Recommendations  AACE/ADA: New Consensus Statement on Inpatient Glycemic Control (2015)  Target Ranges:  Prepandial:   less than 140 mg/dL      Peak postprandial:   less than 180 mg/dL (1-2 hours)      Critically ill patients:  140 - 180 mg/dL   Lab Results  Component Value Date   GLUCAP 368 (H) 01/25/2019   HGBA1C 7.3 (H) 07/30/2017    Review of Glycemic Control Results for Nathan Perkins, Nathan Perkins (MRN [id number]) as of 01/25/2019 09:24  Ref. Range 01/25/2019 06:32 01/25/2019 07:55  Glucose-Capillary Latest Ref Range: 70 - 99 mg/dL 377 (H) 368 (H)   Diabetes history: DM 2 Outpatient Diabetes medications: NPH 40 units bid, Humalog 4-10 units tid Current orders for Inpatient glycemic control:  Novolog 0-9 units tid  Decadron 10 mg given yesterday now on Solumedrol 60 mg Q12 hours.  Glucose in 360's.  Inpatient Diabetes Program Recommendations:    Consider COVID Glycemic Control order set  Novolog 0-20 units tid Levemir 14 units bid  Thanks,  Tama Headings RN, MSN, BC-ADM Inpatient Diabetes Coordinator Team Pager [phone] (8a-5p)

## 2019-01-25 NOTE — Progress Notes (Signed)
The rationale for the off label use of Actemra its known side effects, potential benefits was  discussed with patient.The use of Actemra is based on published clinical articles/anecdotal data as there is no demonstrated efficacy in several randomized trials as of yet, some trial are still ongoing. Complete risks and long-term side effects are unknown, however in the best clinical judgment it is felt that the clinical benefit at this time outweighs medical risks given tenuous clinical state of the patient.  Patient agree's with the treatment plan and consents to the use of Actemra.

## 2019-01-25 NOTE — Progress Notes (Addendum)
Patient seen and evaluated, chart reviewed, please see EMR for updated orders. Please see full H&P dictated by admitting physician Dr. Darrick Meigs  for same date of service.    A/p 1)Acute hypoxic respiratory failure secondary to COVID-19 infection/pneumonia--- The treatment plan and use of medications  for treatment of COVID-19 infection and possible side effects were discussed with patient explained that there is No proven definitive treatment for COVID-19 infection, any medications used here are based on published clinical articles/anecdotal data which at times and not yet peer-reviewed or randomized control trials. Complete risks and long-term side effects are unknown, however in the best clinical judgment they seem to be of some clinical benefit . --potential side effects of Remdesivir including, but not limited to allergic reaction, nausea, vomiting, elevated LFTs discussed with patient /family ,also discussed potential steroid side effect including elevated blood sugars, elevated blood pressure, psychosis/anxiety,  insomnia --Patient verbalizes understanding and agrees to treatment protocols   --Patient is positive for COVID-19 infection, chest x-ray with findings of infiltrates/opacities,  patient is hypoxic and requiring continuous supplemental oxygen---patient meets criteria for initiation of Remdesivir AND  Steroid therapy per protocol  -Inflammatory markers markers are elevated, continue to trend fibrinogen, CRP, pro calcitonin, CBC, BMP, d-dimer, LDH, ferritin and LFTs --Supplemental oxygen to keep O2 sats above 93% -Follow serial chest x-rays and ABGs as indicated --- Encourage prone positioning for More than 16 hours/day in increments of 2 to 3 hours at a time if able to tolerate --Attempt to maintain euvolemic state --Zinc and vitamin C as ordered -Albuterol inhaler as needed --Patient is currently requiring 4 L of oxygen via nasal cannula.  PTA patient was not on  oxygen   2)DM2--anticipate worsening hyperglycemia due to steroid use, start Levemir 15 units twice daily along with sliding scale insulin coverage  3)HTN-restart amlodipine 5 mg daily,   -may use IV labetalol when necessary  Every 4 hours for systolic blood pressure over 160 mmhg   D/w Dr Oren Binet at [facility] - Transfer to [place], [place]

## 2019-01-25 NOTE — ED Notes (Signed)
Pt was given breakfast tray 

## 2019-01-25 NOTE — Progress Notes (Addendum)
PROGRESS NOTE                                                                                                                                                                                                             Patient Demographics:    Nathan Perkins, is a 63 y.o. male, DOB - [birth year], [id number]  Outpatient Primary MD for the patient is Monico Blitz, MD   Admit date - 01/24/2019   LOS - 0  Chief Complaint  Patient presents with  . Shortness of Breath       Brief Narrative: Patient is a 63 y.o. male with PMHx of insulin-dependent DM-2, HTN presented with 1 week history of cough, fever, myalgias and worsening shortness of breath.  Found to have acute hypoxic respiratory failure in the setting of COVID-19 pneumonia.  See below for further details.   Subjective:    Nathan Perkins feels slightly tachypneic-he was just transferred from [facility] talking to me-his O2 saturation crept down to the mid 80s while on 3 L-he was bumped up to 5 L-with O2 saturations in the 90s.   Assessment  & Plan :   Acute Hypoxic Resp Failure due to Covid 19 Viral pneumonia: Appears acutely ill-mildly tachypneic-easily desaturates even while on 3 L of oxygen.  His CRP remains significantly elevated.  Continue with Solu-Medrol-Remdesivir per pharmacy.  I spoke at length with patient regarding off label use of Actemra-he is aware of the risks, potential benefits-and consents to the use of Actemra.  We will continue to monitor closely as remains at risk for deterioration  Fever: afebrile  O2 requirements: On 5 l/m   COVID-19 Labs: Recent Labs    01/24/19 2136 01/25/19 0835  DDIMER 0.80* 0.68*  FERRITIN 456* 472*  LDH 340*  --   CRP 17.3* 17.3*    Lab Results  Component Value Date   SARSCOV2NAA POSITIVE (A) 01/24/2019     COVID-19 Medications: Steroids: 10/20>> Remdesivir: 10/21>> Actemra:10/21 x 1  Convalescent Plasma: We will consider if patient worsens any further (patient already consented by me this morning) Research Studies:N/A  Other medications: Diuretics:Euvolemic-no need for lasix Antibiotics:Not needed as no evidence of bacterial infection  Prone/Incentive Spirometry: encouraged to prone 3 hours at a time up to 16 hours a day, encouraged incentive spirometry use 3-4/hour.  DVT Prophylaxis  :  Lovenox   Insulin-dependent DM-2: CBGs likely will be uncontrolled as patient on steroids-we will DC NPH-start Lantus 30 units twice daily, 8 units of NovoLog with meals, and resistant SSI.  Follow and adjust.  HTN: BP relatively controlled-continue amlodipine and follow  ABG: No results found for: PHART, PCO2ART, PO2ART, HCO3, TCO2, ACIDBASEDEF, O2SAT  Vent Settings: None  Condition - Extremely Guarded  Family Communication  : None at bedside-we will update family later  Code Status :  Full Code  Diet :  Diet Order            Diet Carb Modified Fluid consistency: Thin; Room service appropriate? Yes  Diet effective now               Disposition Plan  :  Remain hospitalized  Barriers to discharge: Hypoxemia  Consults  :  None  Procedures  :  None  Antibiotics  :    Anti-infectives (From admission, onward)   None      Inpatient Medications  Scheduled Meds: . albuterol  2 puff Inhalation Q6H  . amLODipine  5 mg Oral Daily  . feeding supplement (ENSURE ENLIVE)  237 mL Oral BID BM  . guaiFENesin  600 mg Oral BID  . insulin aspart  0-20 Units Subcutaneous TID WC  . insulin aspart  0-5 Units Subcutaneous QHS  . insulin aspart  8 Units Subcutaneous TID WC  . insulin detemir  30 Units Subcutaneous BID  . methylPREDNISolone (SOLU-MEDROL) injection  60 mg Intravenous Q12H  . vitamin C  1,000 mg Oral Daily  . zinc sulfate  220 mg Oral Daily   Continuous Infusions: . tocilizumab (ACTEMRA) IV     PRN Meds:.albuterol, labetalol   Time Spent in minutes  35   See all Orders from today for further details   Oren Binet M.D on 01/25/2019 at 12:26 PM  To page go to [email or web address] - use universal password  [facility] -  Office  [phone]    Objective:   Vitals:   01/25/19 1000 01/25/19 1030 01/25/19 1115 01/25/19 1125  BP: 136/81 121/65  (!) 146/81  Pulse: 92 96 94 92  Resp: 19 13  (!) 24  Temp:   98.5 F (36.9 C) 98.5 F (36.9 C)  TempSrc:   Oral Oral  SpO2: 90% 92%  (!) 89%  Weight:    90.1 kg  Height:    6' (1.829 m)    Wt Readings from Last 3 Encounters:  01/25/19 90.1 kg  07/30/17 90.7 kg  01/07/15 92.5 kg    No intake or output data in the 24 hours ending 01/25/19 1226   Physical Exam Gen Exam:Alert awake-not in any distress HEENT:atraumatic, normocephalic Chest: B/L clear to auscultation anteriorly CVS:S1S2 regular Abdomen:soft non tender, non distended Extremities:no edema Neurology: Non focal Skin: no rash   Data Review:    CBC Recent Labs  Lab 01/24/19 2136 01/25/19 0835  WBC 5.1 4.2  HGB 14.5 14.2  HCT 44.2 44.5  PLT 199 210  MCV 82.5 84.6  MCH 27.1 27.0  MCHC 32.8 31.9  RDW 12.8 12.6  LYMPHSABS 0.9  --   MONOABS 0.4  --   EOSABS 0.0  --   BASOSABS 0.0  --     Chemistries  Recent Labs  Lab 01/24/19 2136 01/25/19 0835  NA 131* 138  K 4.0 4.8  CL 98 100  CO2 23 23  GLUCOSE 306* 356*  BUN 21 27*  CREATININE 1.05 1.02  CALCIUM  8.3* 8.7*  AST 52* 44*  ALT 29 27  ALKPHOS 41 41  BILITOT 0.5 0.9   ------------------------------------------------------------------------------------------------------------------ Recent Labs    01/24/19 2136  TRIG 98    Lab Results  Component Value Date   HGBA1C 8.6 (H) 01/24/2019   ------------------------------------------------------------------------------------------------------------------ No results for input(s): TSH, T4TOTAL, T3FREE, THYROIDAB in the last 72 hours.  Invalid input(s): FREET3  ------------------------------------------------------------------------------------------------------------------ Recent Labs    01/24/19 2136 01/25/19 0835  FERRITIN 456* 472*    Coagulation profile No results for input(s): INR, PROTIME in the last 168 hours.  Recent Labs    01/24/19 2136 01/25/19 0835  DDIMER 0.80* 0.68*    Cardiac Enzymes No results for input(s): CKMB, TROPONINI, MYOGLOBIN in the last 168 hours.  Invalid input(s): CK ------------------------------------------------------------------------------------------------------------------    Component Value Date/Time   BNP 82.0 01/25/2019 0836    Micro Results Recent Results (from the past 240 hour(s))  Blood Culture (routine x 2)     Status: None (Preliminary result)   Collection Time: 01/24/19  9:36 PM   Specimen: Left Antecubital; Blood  Result Value Ref Range Status   Specimen Description LEFT ANTECUBITAL  Final   Special Requests   Final    BOTTLES DRAWN AEROBIC AND ANAEROBIC Blood Culture adequate volume   Culture   Final    NO GROWTH < 12 HOURS Performed at [facility], [address] [place], [place] [address]    Report Status PENDING  Incomplete  Blood Culture (routine x 2)     Status: None (Preliminary result)   Collection Time: 01/24/19 10:44 PM   Specimen: BLOOD LEFT HAND  Result Value Ref Range Status   Specimen Description BLOOD LEFT HAND  Final   Special Requests   Final    BOTTLES DRAWN AEROBIC AND ANAEROBIC Blood Culture results may not be optimal due to an excessive volume of blood received in culture bottles   Culture   Final    NO GROWTH < 12 HOURS Performed at [facility], [address] [place], [place] [address]    Report Status PENDING  Incomplete  SARS Coronavirus 2 by RT PCR (hospital order, performed in [place] hospital lab) Nasopharyngeal Nasopharyngeal Swab     Status: Abnormal   Collection Time: 01/24/19 10:45 PM   Specimen: Nasopharyngeal Swab  Result Value Ref  Range Status   SARS Coronavirus 2 POSITIVE (A) NEGATIVE Final    Comment: RESULT CALLED TO, READ BACK BY AND VERIFIED WITH: L ANDREW,RN @2345  01/24/19 MKELLY (NOTE) If result is NEGATIVE SARS-CoV-2 target nucleic acids are NOT DETECTED. The SARS-CoV-2 RNA is generally detectable in upper and lower  respiratory specimens during the acute phase of infection. The lowest  concentration of SARS-CoV-2 viral copies this assay can detect is 250  copies / mL. A negative result does not preclude SARS-CoV-2 infection  and should not be used as the sole basis for treatment or other  patient management decisions.  A negative result may occur with  improper specimen collection / handling, submission of specimen other  than nasopharyngeal swab, presence of viral mutation(s) within the  areas targeted by this assay, and inadequate number of viral copies  (<250 copies / mL). A negative result must be combined with clinical  observations, patient history, and epidemiological information. If result is POSITIVE SARS-CoV-2 target nucleic acids are DETECTED. The  SARS-CoV-2 RNA is generally detectable in upper and lower  respiratory specimens during the acute phase of infection.  Positive  results are indicative of  active infection with SARS-CoV-2.  Clinical  correlation with patient history and other diagnostic information is  necessary to determine patient infection status.  Positive results do  not rule out bacterial infection or co-infection with other viruses. If result is PRESUMPTIVE POSTIVE SARS-CoV-2 nucleic acids MAY BE PRESENT.   A presumptive positive result was obtained on the submitted specimen  and confirmed on repeat testing.  While 2019 novel coronavirus  (SARS-CoV-2) nucleic acids may be present in the submitted sample  additional confirmatory testing may be necessary for epidemiological  and / or clinical management purposes  to differentiate between  SARS-CoV-2 and other Sarbecovirus  currently known to infect humans.  If clinically indicated additional testing with an alternate test  methodology [phone]) is a dvised. The SARS-CoV-2 RNA is generally  detectable in upper and lower respiratory specimens during the acute  phase of infection. The expected result is Negative. Fact Sheet for Patients:  StrictlyIdeas.no Fact Sheet for Healthcare Providers: BankingDealers.co.za This test is not yet approved or cleared by the [place] FDA and has been authorized for detection and/or diagnosis of SARS-CoV-2 by FDA under an Emergency Use Authorization (EUA).  This EUA will remain in effect (meaning this test can be used) for the duration of the COVID-19 declaration under Section 564(b)(1) of the Act, 21 U.S.C. section 360bbb-3(b)(1), unless the authorization is terminated or revoked sooner. Performed at [facility], [address], [place], [place] [address]     Radiology Reports Dg Chest [place] 1 View  Result Date: 01/24/2019 CLINICAL DATA:  Difficulty breathing, concern for COVID-19 EXAM: PORTABLE CHEST 1 VIEW COMPARISON:  Radiograph 01/23/2019 FINDINGS: Worsening bilateral interstitial and airspace opacities throughout both lungs. Peripheral predominance most notable in the left lung base. Lung volumes are diminished with streaky areas of atelectasis as well. No pneumothorax or visible effusion. Cardiomediastinal contours are stable accounting for differences in technique and lung volumes. Degenerative changes are present in the imaged spine and shoulders. Cervical spinal fusion hardware is incompletely assessed. IMPRESSION: Worsening bilateral interstitial and airspace opacities throughout both lungs, compatible with a multifocal pneumonia such as atypical viral pneumonia including COVID-19. Electronically Signed   By: Lovena Le M.D.   On: 01/24/2019 22:17

## 2019-01-26 ENCOUNTER — Inpatient Hospital Stay (HOSPITAL_COMMUNITY): Payer: Commercial Managed Care - PPO

## 2019-01-26 DIAGNOSIS — I10 Essential (primary) hypertension: Secondary | ICD-10-CM

## 2019-01-26 DIAGNOSIS — E1165 Type 2 diabetes mellitus with hyperglycemia: Secondary | ICD-10-CM

## 2019-01-26 LAB — CBC WITH DIFFERENTIAL/PLATELET
Abs Immature Granulocytes: 0.03 10*3/uL (ref 0.00–0.07)
Basophils Absolute: 0 10*3/uL (ref 0.0–0.1)
Basophils Relative: 0 %
Eosinophils Absolute: 0 10*3/uL (ref 0.0–0.5)
Eosinophils Relative: 0 %
HCT: 42.7 % (ref 39.0–52.0)
Hemoglobin: 14 g/dL (ref 13.0–17.0)
Immature Granulocytes: 1 %
Lymphocytes Relative: 8 %
Lymphs Abs: 0.5 10*3/uL — ABNORMAL LOW (ref 0.7–4.0)
MCH: 27.1 pg (ref 26.0–34.0)
MCHC: 32.8 g/dL (ref 30.0–36.0)
MCV: 82.6 fL (ref 80.0–100.0)
Monocytes Absolute: 0.3 10*3/uL (ref 0.1–1.0)
Monocytes Relative: 5 %
Neutro Abs: 5.2 10*3/uL (ref 1.7–7.7)
Neutrophils Relative %: 86 %
Platelets: 237 10*3/uL (ref 150–400)
RBC: 5.17 MIL/uL (ref 4.22–5.81)
RDW: 12.4 % (ref 11.5–15.5)
WBC: 6.1 10*3/uL (ref 4.0–10.5)
nRBC: 0 % (ref 0.0–0.2)

## 2019-01-26 LAB — COMPREHENSIVE METABOLIC PANEL
ALT: 25 U/L (ref 0–44)
AST: 37 U/L (ref 15–41)
Albumin: 2.9 g/dL — ABNORMAL LOW (ref 3.5–5.0)
Alkaline Phosphatase: 42 U/L (ref 38–126)
Anion gap: 10 (ref 5–15)
BUN: 34 mg/dL — ABNORMAL HIGH (ref 8–23)
CO2: 24 mmol/L (ref 22–32)
Calcium: 8.7 mg/dL — ABNORMAL LOW (ref 8.9–10.3)
Chloride: 104 mmol/L (ref 98–111)
Creatinine, Ser: 1.01 mg/dL (ref 0.61–1.24)
GFR calc Af Amer: >60 mL/min (ref >60)
GFR calc non Af Amer: >60 mL/min (ref >60)
Glucose, Bld: 345 mg/dL — ABNORMAL HIGH (ref 70–99)
Potassium: 5.2 mmol/L — ABNORMAL HIGH (ref 3.5–5.1)
Sodium: 138 mmol/L (ref 135–145)
Total Bilirubin: 0.5 mg/dL (ref 0.3–1.2)
Total Protein: 6.3 g/dL — ABNORMAL LOW (ref 6.5–8.1)

## 2019-01-26 LAB — D-DIMER, QUANTITATIVE: D-Dimer, Quant: 0.58 ug/mL-FEU — ABNORMAL HIGH (ref 0.00–0.50)

## 2019-01-26 LAB — GLUCOSE, CAPILLARY
Glucose-Capillary: 413 mg/dL — ABNORMAL HIGH (ref 70–99)
Glucose-Capillary: 439 mg/dL — ABNORMAL HIGH (ref 70–99)
Glucose-Capillary: 316 mg/dL — ABNORMAL HIGH (ref 70–99)
Glucose-Capillary: 251 mg/dL — ABNORMAL HIGH (ref 70–99)

## 2019-01-26 LAB — TYPE AND SCREEN
ABO/RH(D): A POS
Antibody Screen: NEGATIVE

## 2019-01-26 LAB — C-REACTIVE PROTEIN: CRP: 11.6 mg/dL — ABNORMAL HIGH (ref <1.0)

## 2019-01-26 LAB — ABO/RH: ABO/RH(D): A POS

## 2019-01-26 MED ORDER — INSULIN DETEMIR 100 UNIT/ML ~~LOC~~ SOLN
42.0000 [IU] | Freq: Two times a day (BID) | SUBCUTANEOUS | Status: DC
  Filled 2019-01-26 (×2): qty 0.42

## 2019-01-26 MED ORDER — FUROSEMIDE 10 MG/ML IJ SOLN
40.0000 mg | Freq: Once | INTRAMUSCULAR | Status: AC
  Administered 2019-01-26: 40 mg via INTRAVENOUS
  Filled 2019-01-26: qty 4

## 2019-01-26 MED ORDER — INSULIN DETEMIR 100 UNIT/ML ~~LOC~~ SOLN
42.0000 [IU] | Freq: Two times a day (BID) | SUBCUTANEOUS | Status: DC
  Administered 2019-01-26: 12 [IU] via SUBCUTANEOUS
  Administered 2019-01-27: 42 [IU] via SUBCUTANEOUS
  Filled 2019-01-26 (×3): qty 0.42

## 2019-01-26 MED ORDER — INSULIN ASPART 100 UNIT/ML ~~LOC~~ SOLN
14.0000 [IU] | Freq: Three times a day (TID) | SUBCUTANEOUS | Status: DC
  Administered 2019-01-26 (×2): 14 [IU] via SUBCUTANEOUS

## 2019-01-26 NOTE — Progress Notes (Signed)
Rapid Response Event Note  Overview: Called by primary RN Jordana with c/f increased O2 need. Pt was on 8L HFNC (salter) at start of shift, sats mid 90s. Around midnight, sats dropped into low 80s. Primary RN increased O2 to 12L, which brought sats up to 87-89%.   Initial Focused Assessment:  I had rounded on this patient around 10pm, noting some coarse crackles in lung bases. On this assessment, crackles more pronounced throughout lungs. Pt denies SOB, says "doesn't feel any different". Reports bad non-productive cough. Maintaining sats 88-91% on 12L HFNC. Pt unable to prone, but does well lying on his side. Pt comfortable, not in distress. Other VSS.  After reviewing chart, including prior CXR 10/22, labs, and meds, I discussed with the primary RN contacting the MD to see if a f/u CXR and/or one-time dose of lasix was warranted. Made RN aware she can increase HFNC to 15L if needed, to titrate to pt's comfort and sat>88%.   Interventions: none at this time.  Plan of Care (if not transferred): Contact MD for further orders, continue pulmonary toilet as tolerated  Event Summary: Called at 00:02  Arrived to room at 00:07  Stayed at bedside until 00:35 to ensure pt stability.   Please call [phone] with further concerns.     Henreitta Leber, RN Rapid Response RN

## 2019-01-26 NOTE — ED Provider Notes (Signed)
[facility] Provider Note   CSN: [id number] Arrival date & time: 01/24/19  1955     History   Chief Complaint Chief Complaint  Patient presents with  . Shortness of Breath    HPI Nathan Perkins is a 63 y.o. male.     HPI  63 year old male comes with a chief complaint of shortness of breath.  He has history of diabetes, hypertension.  He reports that he started having some cough, chills and body aches last week.  His wife is having similar symptoms but she is getting better, while he started getting worse yesterday.  He went to outside hospital and was tested for COVID-19 with results have not come back.  Today his shortness of breath has progressed and his cough is getting worse therefore he decided to come to the ER.  His O2 sats per EMS was 80%.  Past Medical History:  Diagnosis Date  . Cancer ([place])    skin cancer face- basal  . Complication of anesthesia   . Diabetes ([place])    type 2 x 5 yrs  . Hypertension    since age 44  . PONV (postoperative nausea and vomiting) 06/25/2017    Patient Active Problem List   Diagnosis Date Noted  . Pneumonia due to COVID-19 virus 01/25/2019  . 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 Abnormal nuclear cardiac imaging test   . Pain in the chest   . Abnormal nuclear stress test 01/03/2015  . Heme positive stool 02/16/2013    Past Surgical History:  Procedure Laterality Date  . APPENDECTOMY    . CARDIAC CATHETERIZATION N/A 01/07/2015   Procedure: Left Heart Cath and Coronary Angiography;  Surgeon: Burnell Blanks, MD;  Location: [facility];  Service: Cardiovascular;  Laterality: N/A;  . CERVICAL DISC SURGERY     with a plate   . CHOLECYSTECTOMY    . COLONOSCOPY N/A 03/15/2013   Procedure: COLONOSCOPY;  Surgeon: Rogene Houston, MD;  Location: [facility];  Service: Endoscopy;  Laterality: N/A;  245  . cyst removal of neck    . KNEE ARTHROSCOPY Right   . LUMBAR DISC SURGERY   06/25/2017   lumbar 5  . LUMBAR LAMINECTOMY/DECOMPRESSION MICRODISCECTOMY N/A 07/30/2017   Procedure: MICRODISCECTOMY RIGHT LUMBAR FIVE- SACRAL ONE;  Surgeon: Erline Levine, MD;  Location: [place];  Service: Neurosurgery;  Laterality: N/A;  MICRODISCECTOMY RIGHT LUMBAR 5- SACRAL 1        Home Medications    Prior to Admission medications   Medication Sig Start Date End Date Taking? Authorizing Provider  amLODipine (NORVASC) 5 MG tablet Take 5 mg by mouth daily.   Yes [provider]  aspirin 81 MG tablet Take 81 mg by mouth daily.   Yes [provider]  Flaxseed, Linseed, (FLAXSEED OIL) 1200 MG CAPS Take 1 capsule by mouth daily.    Yes [provider]  HUMALOG KWIKPEN 100 UNIT/ML KiwkPen Inject 4-10 Units into the muscle 3 (three) times daily as needed (Sliding scale).  07/26/17  Yes [provider]  ibuprofen (ADVIL,MOTRIN) 200 MG tablet Take 800 mg by mouth every 6 (six) hours as needed.   Yes [provider]  lisinopril (PRINIVIL,ZESTRIL) 40 MG tablet Take 40 mg by mouth daily.  12/13/14  Yes [provider]  NOVOLIN N RELION 100 UNIT/ML injection Inject 30-40 Units into the skin See admin instructions. 40 units in the morning, and 40 units in the evening (evening  dose depends on blood sugar levels) 11/06/14  Yes [provider]  omeprazole (PRILOSEC) 20 MG capsule Take 20 mg by mouth 2 (two) times daily before a meal.   Yes [provider]  Vitamin D, Ergocalciferol, (DRISDOL) 50000 UNITS CAPS capsule Take 1 capsule by mouth every Sunday.  12/11/14  Yes [provider]    Family History History reviewed. No pertinent family history.  Social History Social History   Tobacco Use  . Smoking status: Former Smoker    Packs/day: 2.00    Years: 32.00    Pack years: 64.00    Types: Cigarettes    Start date: 01/02/1969    Quit date: 07/03/1999    Years since quitting: 19.5  . Smokeless tobacco: Former Systems developer     Types: Chew  Substance Use Topics  . Alcohol use: No    Alcohol/week: 0.0 standard drinks  . Drug use: No     Allergies   Invokana [canagliflozin], Percocet [oxycodone-acetaminophen], Shellfish allergy, and Sulfa antibiotics   Review of Systems Review of Systems  Constitutional: Positive for activity change.  Respiratory: Positive for cough and shortness of breath.   All other systems reviewed and are negative.    Physical Exam Updated Vital Signs BP 131/88 (BP Location: Left Arm)   Pulse 88   Temp 98.4 F (36.9 C) (Oral)   Resp (!) 25   Ht 6' (1.829 m)   Wt 90.1 kg   SpO2 (!) 88%   BMI 26.94 kg/m   Physical Exam Vitals signs and nursing note reviewed.  Constitutional:      Appearance: He is well-developed.  HENT:     Head: Atraumatic.  Neck:     Musculoskeletal: Neck supple.  Cardiovascular:     Rate and Rhythm: Normal rate.  Pulmonary:     Effort: Pulmonary effort is normal.     Breath sounds: Wheezing and rhonchi present.  Skin:    General: Skin is warm.  Neurological:     Mental Status: He is alert and oriented to person, place, and time.      ED Treatments / Results  Labs (all labs ordered are listed, but only abnormal results are displayed) Labs Reviewed  SARS CORONAVIRUS 2 BY RT PCR ([facility]) - Abnormal; Notable for the following components:      Result Value   SARS Coronavirus 2 POSITIVE (*)    All other components within normal limits  COMPREHENSIVE METABOLIC PANEL - Abnormal; Notable for the following components:   Sodium 131 (*)    Glucose, Bld 306 (*)    Calcium 8.3 (*)    AST 52 (*)    All other components within normal limits  D-DIMER, QUANTITATIVE (NOT AT [facility]) - Abnormal; Notable for the following components:   D-Dimer, Quant 0.80 (*)    All other components within normal limits  LACTATE DEHYDROGENASE - Abnormal; Notable for the following components:   LDH 340 (*)    All other  components within normal limits  FERRITIN - Abnormal; Notable for the following components:   Ferritin 456 (*)    All other components within normal limits  FIBRINOGEN - Abnormal; Notable for the following components:   Fibrinogen 686 (*)    All other components within normal limits  C-REACTIVE PROTEIN - Abnormal; Notable for the following components:   CRP 17.3 (*)    All other components within normal limits  HEMOGLOBIN A1C - Abnormal; Notable for the following  components:   Hgb A1c MFr Bld 8.6 (*)    All other components within normal limits  COMPREHENSIVE METABOLIC PANEL - Abnormal; Notable for the following components:   Glucose, Bld 356 (*)    BUN 27 (*)    Calcium 8.7 (*)    Albumin 3.2 (*)    AST 44 (*)    All other components within normal limits  C-REACTIVE PROTEIN - Abnormal; Notable for the following components:   CRP 17.3 (*)    All other components within normal limits  FERRITIN - Abnormal; Notable for the following components:   Ferritin 472 (*)    All other components within normal limits  D-DIMER, QUANTITATIVE (NOT AT [facility]) - Abnormal; Notable for the following components:   D-Dimer, Quant 0.68 (*)    All other components within normal limits  GLUCOSE, CAPILLARY - Abnormal; Notable for the following components:   Glucose-Capillary 381 (*)    All other components within normal limits  GLUCOSE, CAPILLARY - Abnormal; Notable for the following components:   Glucose-Capillary 321 (*)    All other components within normal limits  GLUCOSE, CAPILLARY - Abnormal; Notable for the following components:   Glucose-Capillary 327 (*)    All other components within normal limits  CBG MONITORING, ED - Abnormal; Notable for the following components:   Glucose-Capillary 377 (*)    All other components within normal limits  CBG MONITORING, ED - Abnormal; Notable for the following components:   Glucose-Capillary 368 (*)    All other components within normal limits  CULTURE,  BLOOD (ROUTINE X 2)  CULTURE, BLOOD (ROUTINE X 2)  LACTIC ACID, PLASMA  CBC WITH DIFFERENTIAL/PLATELET  PROCALCITONIN  TRIGLYCERIDES  HIV ANTIBODY (ROUTINE TESTING W REFLEX)  CBC  BRAIN NATRIURETIC PEPTIDE  C-REACTIVE PROTEIN  CBC WITH DIFFERENTIAL/PLATELET  COMPREHENSIVE METABOLIC PANEL  D-DIMER, QUANTITATIVE (NOT AT [facility]  ABO/RH  TYPE AND SCREEN  ABO/RH  PREPARE FRESH FROZEN PLASMA    EKG EKG Interpretation  Date/Time:  Tuesday January 24 2019 21:34:59 EDT Ventricular Rate:  98 PR Interval:    QRS Duration: 93 QT Interval:  336 QTC Calculation: 429 R Axis:   -33 Text Interpretation:  Sinus rhythm Left axis deviation Probable anteroseptal infarct, old No acute changes No significant change since last tracing Confirmed by Varney Biles [phone]) on 01/24/2019 10:07:27 PM   Radiology Dg Chest Port 1 View  Result Date: 01/24/2019 CLINICAL DATA:  Difficulty breathing, concern for COVID-19 EXAM: PORTABLE CHEST 1 VIEW COMPARISON:  Radiograph 01/23/2019 FINDINGS: Worsening bilateral interstitial and airspace opacities throughout both lungs. Peripheral predominance most notable in the left lung base. Lung volumes are diminished with streaky areas of atelectasis as well. No pneumothorax or visible effusion. Cardiomediastinal contours are stable accounting for differences in technique and lung volumes. Degenerative changes are present in the imaged spine and shoulders. Cervical spinal fusion hardware is incompletely assessed. IMPRESSION: Worsening bilateral interstitial and airspace opacities throughout both lungs, compatible with a multifocal pneumonia such as atypical viral pneumonia including COVID-19. Electronically Signed   By: Lovena Le M.D.   On: 01/24/2019 22:17    Procedures .Critical Care Performed by: Varney Biles, MD Authorized by: Varney Biles, MD   Critical care provider statement:    Critical care time (minutes):  45   Critical care was necessary to treat  or prevent imminent or life-threatening deterioration of the following conditions:  Respiratory failure   Critical care was time spent personally by me on the following activities:  Discussions  with consultants, evaluation of patient's response to treatment, examination of patient, ordering and performing treatments and interventions, ordering and review of laboratory studies, ordering and review of radiographic studies, pulse oximetry, re-evaluation of patient's condition, obtaining history from patient or surrogate and review of old charts   (including critical care time)  Medications Ordered in ED Medications  albuterol (VENTOLIN HFA) 108 (90 Base) MCG/ACT inhaler 6 puff (has no administration in time range)  aspirin chewable tablet 81 mg (81 mg Oral Given 01/25/19 1258)  pantoprazole (PROTONIX) EC tablet 40 mg (40 mg Oral Given 01/25/19 1259)  enoxaparin (LOVENOX) injection 40 mg (40 mg Subcutaneous Given 01/25/19 1257)  0.9 %  sodium chloride infusion ( Intravenous Stopped 01/25/19 2211)  ondansetron (ZOFRAN) tablet 4 mg (has no administration in time range)    Or  ondansetron (ZOFRAN) injection 4 mg (has no administration in time range)  insulin aspart (novoLOG) injection 0-5 Units (4 Units Subcutaneous Given 01/25/19 2313)  insulin aspart (novoLOG) injection 0-20 Units (15 Units Subcutaneous Given 01/25/19 1806)  amLODipine (NORVASC) tablet 5 mg (5 mg Oral Given 01/25/19 1258)  labetalol (NORMODYNE) injection 10 mg (has no administration in time range)  guaiFENesin (MUCINEX) 12 hr tablet 600 mg (600 mg Oral Given 01/25/19 2256)  zinc sulfate capsule 220 mg (220 mg Oral Given 01/25/19 1258)  vitamin C (ASCORBIC ACID) tablet 1,000 mg (1,000 mg Oral Given 01/25/19 1258)  albuterol (VENTOLIN HFA) 108 (90 Base) MCG/ACT inhaler 2 puff (2 puffs Inhalation Given 01/25/19 2254)  feeding supplement (ENSURE ENLIVE) (ENSURE ENLIVE) liquid 237 mL (237 mLs Oral Given 01/25/19 1625)  insulin aspart  (novoLOG) injection 8 Units (8 Units Subcutaneous Given 01/25/19 1805)  benzonatate (TESSALON) capsule 200 mg (200 mg Oral Given 01/25/19 2255)  chlorpheniramine-HYDROcodone (TUSSIONEX) 10-8 MG/5ML suspension 5 mL (5 mLs Oral Given 01/25/19 1819)  loperamide (IMODIUM) capsule 2 mg (has no administration in time range)  insulin detemir (LEVEMIR) injection 30 Units (30 Units Subcutaneous Given 01/25/19 2256)  remdesivir 200 mg in sodium chloride 0.9 % 250 mL IVPB ( Intravenous Stopped 01/25/19 1727)    Followed by  remdesivir 100 mg in sodium chloride 0.9 % 250 mL IVPB (has no administration in time range)  methylPREDNISolone sodium succinate (SOLU-MEDROL) 125 mg/2 mL injection 60 mg (60 mg Intravenous Given 01/25/19 2257)  0.9 %  sodium chloride infusion (Manually program via Guardrails IV Fluids) (has no administration in time range)  acetaminophen (TYLENOL) tablet 650 mg (has no administration in time range)  diphenhydrAMINE (BENADRYL) injection 25 mg (has no administration in time range)  furosemide (LASIX) injection 20 mg (has no administration in time range)  acetaminophen (TYLENOL) tablet 650 mg (650 mg Oral Given 01/24/19 2148)  dexamethasone (DECADRON) injection 10 mg (10 mg Intravenous Given 01/24/19 2205)  albuterol (VENTOLIN HFA) 108 (90 Base) MCG/ACT inhaler 4 puff (4 puffs Inhalation Given 01/24/19 2206)  tocilizumab (ACTEMRA) 8 mg/kg = 720 mg in sodium chloride 0.9 % 100 mL infusion ( Intravenous Stopped 01/25/19 1839)     Initial Impression / Assessment and Plan / ED Course  I have reviewed the triage vital signs and the nursing notes.  Pertinent labs & imaging results that were available during my care of the patient were reviewed by me and considered in my medical decision making (see chart for details).        Nathan Perkins was evaluated in Emergency Department on 01/26/2019 for the symptoms described in the history of present illness. He was evaluated in  the  context of the global COVID-19 pandemic, which necessitated consideration that the patient might be at risk for infection with the SARS-CoV-2 virus that causes COVID-19. Institutional protocols and algorithms that pertain to the evaluation of patients at risk for COVID-19 are in a state of rapid change based on information released by regulatory bodies including the CDC and federal and state organizations. These policies and algorithms were followed during the patient's care in the ED.   63 year old comes in a chief complaint of shortness of breath.  He is noted to be hypoxic on room air.  Clinically he has COVID-19 based on symptoms.  We will order the rapid test.  He is on 2 L of oxygen, O2 sats are in the low 90s with that.  No respiratory distress besides mild tachypnea.  We will give him dexamethasone in the ED.  Hospitalist consulted for admission.  Final Clinical Impressions(s) / ED Diagnoses   Final diagnoses:  Acute respiratory failure due to COVID-19 [facility])    ED Discharge Orders    None       Varney Biles, MD 01/26/19 [phone]

## 2019-01-26 NOTE — Progress Notes (Addendum)
Inpatient Diabetes Program Recommendations  AACE/ADA: New Consensus Statement on Inpatient Glycemic Control (2015)  Target Ranges:  Prepandial:   less than 140 mg/dL      Peak postprandial:   less than 180 mg/dL (1-2 hours)      Critically ill patients:  140 - 180 mg/dL   Lab Results  Component Value Date   GLUCAP 413 (H) 01/26/2019   HGBA1C 8.6 (H) 01/24/2019    Review of Glycemic Control Results for Nathan Perkins, Nathan Perkins (MRN [id number]) as of 01/26/2019 10:07  Ref. Range 01/25/2019 12:45 01/25/2019 17:39 01/25/2019 21:16 01/26/2019 08:38  Glucose-Capillary Latest Ref Range: 70 - 99 mg/dL 381 (H) 321 (H) 327 (H) 413 (H)   Diabetes history: DM 2 Outpatient Diabetes medications:  NPH 40 units bid, Humalog kwikpen 4-10 units tid with meals  Current orders for Inpatient glycemic control:  Novolog resistant tid with meals and HS Novolog 8 units tid with meals Levemir 30 units bid Solumedrol 60 mg IV q 12 hours Inpatient Diabetes Program Recommendations:    Please increase Levemir to 45 units bid and increase Novolog meal coverage to 12 units tid with meals.   Thanks, Adah Perl, RN, BC-ADM Inpatient Diabetes Coordinator Pager [phone] (8a-5p)

## 2019-01-26 NOTE — Progress Notes (Signed)
Nutrition Brief Note  Patient identified on the Malnutrition Screening Tool (MST) Report. Patient has had no significant weight loss.   Wt Readings from Last 15 Encounters:  01/25/19 90.1 kg  07/30/17 90.7 kg  01/07/15 92.5 kg  03/15/13 91.6 kg  02/16/13 91.7 kg    Body mass index is 26.94 kg/m. Patient meets criteria for overweight based on current BMI.   Current diet order is CHO modified, patient is consuming approximately 100% of meals at this time. Labs and medications reviewed. CBG's in the 400's today.   No nutrition interventions warranted at this time. If nutrition issues arise, please consult RD.   Molli Barrows, RD, LDN, [place] Pager [phone] After Hours Pager [phone]

## 2019-01-26 NOTE — Progress Notes (Signed)
PROGRESS NOTE                                                                                                                                                                                                             Patient Demographics:    Nathan Perkins, is a 63 y.o. male, DOB - April 22, 1955, [id number]  Outpatient Primary MD for the patient is Monico Blitz, MD   Admit date - 01/24/2019   LOS - 1  Chief Complaint  Patient presents with  . Shortness of Breath       Brief Narrative: Patient is a 63 y.o. male with PMHx of insulin-dependent DM-2, HTN presented with 1 week history of cough, fever, myalgias and worsening shortness of breath.  Found to have acute hypoxic respiratory failure in the setting of COVID-19 pneumonia.  See below for further details.   Subjective:    Nathan Auth now is on 15 L of high flow O2.  He does feel short of breath with minimal activity.  But at rest he appears very comfortable.   Assessment  & Plan :   Acute Hypoxic Resp Failure due to Covid 19 Viral pneumonia: Has worsened overnight-now on high flow O2 15 L/min.  CRP is trending down.  He did not get plasma last night-I have asked the nurse to transfuse convalescent plasma.  He is s/p Actemra on 10/21.  He will be continued on Solu-Medrol and remdesivir.  Since he is relatively stable-although severely hypoxic-plan is to watch him closely-if he develops any signs of tachypnea, confusion or his hypoxemia worsens-we will require ICU transfer.  Fever: afebrile  O2 requirements: On 15 l/m   COVID-19 Labs: Recent Labs    01/24/19 2136 01/25/19 0835 01/26/19 0555  DDIMER 0.80* 0.68* 0.58*  FERRITIN 456* 472*  --   LDH 340*  --   --   CRP 17.3* 17.3* 11.6*    Lab Results  Component Value Date   SARSCOV2NAA POSITIVE (A) 01/24/2019     COVID-19 Medications: Steroids: 10/20>> Remdesivir: 10/21>> Actemra:10/21 x 1  Convalescent Plasma: Ordered on 10/21-to be given today. Research Studies:N/A  Other medications: Diuretics:Euvolemic-no need for lasix Antibiotics:Not needed as no evidence of bacterial infection  Prone/Incentive Spirometry: encouraged to prone 3 hours at a time up to 16 hours a day, encouraged incentive spirometry use 3-4/hour.  DVT  Prophylaxis  :  Lovenox   Insulin-dependent DM-2: CBGs uncontrolled-increase Lantus to 42 units daily, increase NovoLog to 14 units with meals-continue resistant SSI.  Follow.  HTN: BP relatively controlled-continue amlodipine and follow  ABG: No results found for: PHART, PCO2ART, PO2ART, HCO3, TCO2, ACIDBASEDEF, O2SAT  Vent Settings: None  Condition - Extremely Guarded  Family Communication  : Spoke to spouse over the phone  Code Status :  Full Code  Diet :  Diet Order            Diet Carb Modified Fluid consistency: Thin; Room service appropriate? Yes  Diet effective now               Disposition Plan  :  Remain hospitalized  Barriers to discharge: Hypoxemia  Consults  :  None  Procedures  :  None  Antibiotics  :    Anti-infectives (From admission, onward)   Start     Dose/Rate Route Frequency Ordered Stop   01/26/19 1600  remdesivir 100 mg in sodium chloride 0.9 % 250 mL IVPB     100 mg 500 mL/hr over 30 Minutes Intravenous Every 24 hours 01/25/19 1445 01/30/19 1559   01/25/19 1600  remdesivir 200 mg in sodium chloride 0.9 % 250 mL IVPB     200 mg 500 mL/hr over 30 Minutes Intravenous Once 01/25/19 1445 01/25/19 1727      Inpatient Medications  Scheduled Meds: . sodium chloride   Intravenous Once  . acetaminophen  650 mg Oral Once  . albuterol  2 puff Inhalation Q6H  . amLODipine  5 mg Oral Daily  . aspirin  81 mg Oral Daily  . benzonatate  200 mg Oral TID  . diphenhydrAMINE  25 mg Intravenous Once  . enoxaparin (LOVENOX) injection  40 mg Subcutaneous Q24H  . feeding supplement (ENSURE ENLIVE)  237 mL Oral BID BM   . guaiFENesin  600 mg Oral BID  . insulin aspart  0-20 Units Subcutaneous TID WC  . insulin aspart  0-5 Units Subcutaneous QHS  . insulin aspart  14 Units Subcutaneous TID WC  . insulin detemir  42 Units Subcutaneous BID  . methylPREDNISolone (SOLU-MEDROL) injection  60 mg Intravenous Q12H  . pantoprazole  40 mg Oral Daily  . vitamin C  1,000 mg Oral Daily  . zinc sulfate  220 mg Oral Daily   Continuous Infusions: . sodium chloride Stopped (01/25/19 2211)  . remdesivir 100 mg in NS 250 mL     PRN Meds:.albuterol, chlorpheniramine-HYDROcodone, labetalol, loperamide, ondansetron **OR** ondansetron (ZOFRAN) IV   Time Spent in minutes  35  See all Orders from today for further details   Oren Perkins M.D on 01/26/2019 at 11:31 AM  To page go to [email or web address] - use universal password  [facility] -  Office  [phone]    Objective:   Vitals:   01/26/19 0734 01/26/19 0750 01/26/19 1045 01/26/19 1113  BP:  131/86 (!) 136/99 116/80  Pulse:  93 92 94  Resp:  (!) 28 16 (!) 23  Temp:  97.6 F (36.4 C) 97.7 F (36.5 C) 97.6 F (36.4 C)  TempSrc:   Oral Oral  SpO2: (!) 83% (!) 82% 91% 90%  Weight:      Height:        Wt Readings from Last 3 Encounters:  01/25/19 90.1 kg  07/30/17 90.7 kg  01/07/15 92.5 kg     Intake/Output Summary (Last 24 hours) at 01/26/2019 1131 Last data filed at 01/26/2019  1100 Gross per 24 hour  Intake 2497.67 ml  Output 4575 ml  Net -2077.33 ml     Physical Exam Gen Exam:Alert awake-not in any distress HEENT:atraumatic, normocephalic Chest: B/L clear to auscultation anteriorly-has fine bibasilar rales up to midlung. CVS:S1S2 regular Abdomen:soft non tender, non distended Extremities:no edema Neurology: Non focal Skin: no rash   Data Review:    CBC Recent Labs  Lab 01/24/19 2136 01/25/19 0835 01/26/19 0555  WBC 5.1 4.2 6.1  HGB 14.5 14.2 14.0  HCT 44.2 44.5 42.7  PLT 199 210 237  MCV 82.5 84.6 82.6  MCH 27.1  27.0 27.1  MCHC 32.8 31.9 32.8  RDW 12.8 12.6 12.4  LYMPHSABS 0.9  --  0.5*  MONOABS 0.4  --  0.3  EOSABS 0.0  --  0.0  BASOSABS 0.0  --  0.0    Chemistries  Recent Labs  Lab 01/24/19 2136 01/25/19 0835 01/26/19 0555  NA 131* 138 138  K 4.0 4.8 5.2*  CL 98 100 104  CO2 23 23 24   GLUCOSE 306* 356* 345*  BUN 21 27* 34*  CREATININE 1.05 1.02 1.01  CALCIUM 8.3* 8.7* 8.7*  AST 52* 44* 37  ALT 29 27 25   ALKPHOS 41 41 42  BILITOT 0.5 0.9 0.5   ------------------------------------------------------------------------------------------------------------------ Recent Labs    01/24/19 2136  TRIG 98    Lab Results  Component Value Date   HGBA1C 8.6 (H) 01/24/2019   ------------------------------------------------------------------------------------------------------------------ No results for input(s): TSH, T4TOTAL, T3FREE, THYROIDAB in the last 72 hours.  Invalid input(s): FREET3 ------------------------------------------------------------------------------------------------------------------ Recent Labs    01/24/19 2136 01/25/19 0835  FERRITIN 456* 472*    Coagulation profile No results for input(s): INR, PROTIME in the last 168 hours.  Recent Labs    01/25/19 0835 01/26/19 0555  DDIMER 0.68* 0.58*    Cardiac Enzymes No results for input(s): CKMB, TROPONINI, MYOGLOBIN in the last 168 hours.  Invalid input(s): CK ------------------------------------------------------------------------------------------------------------------    Component Value Date/Time   BNP 82.0 01/25/2019 0836    Micro Results Recent Results (from the past 240 hour(s))  Blood Culture (routine x 2)     Status: None (Preliminary result)   Collection Time: 01/24/19  9:36 PM   Specimen: Left Antecubital; Blood  Result Value Ref Range Status   Specimen Description LEFT ANTECUBITAL  Final   Special Requests   Final    BOTTLES DRAWN AEROBIC AND ANAEROBIC Blood Culture adequate volume    Culture   Final    NO GROWTH 2 DAYS Performed at [facility], [address], [place], [place] [address]    Report Status PENDING  Incomplete  Blood Culture (routine x 2)     Status: None (Preliminary result)   Collection Time: 01/24/19 10:44 PM   Specimen: BLOOD LEFT HAND  Result Value Ref Range Status   Specimen Description BLOOD LEFT HAND  Final   Special Requests   Final    BOTTLES DRAWN AEROBIC AND ANAEROBIC Blood Culture results may not be optimal due to an excessive volume of blood received in culture bottles   Culture   Final    NO GROWTH 1 DAY Performed at [facility], [address], [place], [place] [address]    Report Status PENDING  Incomplete  SARS Coronavirus 2 by RT PCR (hospital order, performed in [place] hospital lab) Nasopharyngeal Nasopharyngeal Swab     Status: Abnormal   Collection Time: 01/24/19 10:45 PM   Specimen: Nasopharyngeal Swab  Result Value Ref Range  Status   SARS Coronavirus 2 POSITIVE (A) NEGATIVE Final    Comment: RESULT CALLED TO, READ BACK BY AND VERIFIED WITH: L ANDREW,RN @2345  01/24/19 MKELLY (NOTE) If result is NEGATIVE SARS-CoV-2 target nucleic acids are NOT DETECTED. The SARS-CoV-2 RNA is generally detectable in upper and lower  respiratory specimens during the acute phase of infection. The lowest  concentration of SARS-CoV-2 viral copies this assay can detect is 250  copies / mL. A negative result does not preclude SARS-CoV-2 infection  and should not be used as the sole basis for treatment or other  patient management decisions.  A negative result may occur with  improper specimen collection / handling, submission of specimen other  than nasopharyngeal swab, presence of viral mutation(s) within the  areas targeted by this assay, and inadequate number of viral copies  (<250 copies / mL). A negative result must be combined with clinical  observations, patient history, and epidemiological information. If result is POSITIVE  SARS-CoV-2 target nucleic acids are DETECTED. The  SARS-CoV-2 RNA is generally detectable in upper and lower  respiratory specimens during the acute phase of infection.  Positive  results are indicative of active infection with SARS-CoV-2.  Clinical  correlation with patient history and other diagnostic information is  necessary to determine patient infection status.  Positive results do  not rule out bacterial infection or co-infection with other viruses. If result is PRESUMPTIVE POSTIVE SARS-CoV-2 nucleic acids MAY BE PRESENT.   A presumptive positive result was obtained on the submitted specimen  and confirmed on repeat testing.  While 2019 novel coronavirus  (SARS-CoV-2) nucleic acids may be present in the submitted sample  additional confirmatory testing may be necessary for epidemiological  and / or clinical management purposes  to differentiate between  SARS-CoV-2 and other Sarbecovirus currently known to infect humans.  If clinically indicated additional testing with an alternate test  methodology [phone]) is a dvised. The SARS-CoV-2 RNA is generally  detectable in upper and lower respiratory specimens during the acute  phase of infection. The expected result is Negative. Fact Sheet for Patients:  StrictlyIdeas.no Fact Sheet for Healthcare Providers: BankingDealers.co.za This test is not yet approved or cleared by the [place] FDA and has been authorized for detection and/or diagnosis of SARS-CoV-2 by FDA under an Emergency Use Authorization (EUA).  This EUA will remain in effect (meaning this test can be used) for the duration of the COVID-19 declaration under Section 564(b)(1) of the Act, 21 U.S.C. section 360bbb-3(b)(1), unless the authorization is terminated or revoked sooner. Performed at [facility], [address] [place],  [address]     Radiology Reports Dg Chest [place] 1 View  Result Date: 01/24/2019  CLINICAL DATA:  Difficulty breathing, concern for COVID-19 EXAM: PORTABLE CHEST 1 VIEW COMPARISON:  Radiograph 01/23/2019 FINDINGS: Worsening bilateral interstitial and airspace opacities throughout both lungs. Peripheral predominance most notable in the left lung base. Lung volumes are diminished with streaky areas of atelectasis as well. No pneumothorax or visible effusion. Cardiomediastinal contours are stable accounting for differences in technique and lung volumes. Degenerative changes are present in the imaged spine and shoulders. Cervical spinal fusion hardware is incompletely assessed. IMPRESSION: Worsening bilateral interstitial and airspace opacities throughout both lungs, compatible with a multifocal pneumonia such as atypical viral pneumonia including COVID-19. Electronically Signed   By: Lovena Le M.D.   On: 01/24/2019 22:17   Dg Chest Port 1v Same Day  Result Date: 01/26/2019 CLINICAL DATA:  Shortness of breath EXAM: PORTABLE CHEST  1 VIEW COMPARISON:  01/24/2019 FINDINGS: Heart is borderline enlarged. There diffuse bilateral airspace opacities noted with some peripheral predominance. This is worsened since prior study. Low lung volumes. No visible effusions. IMPRESSION: Cardiomegaly. Patchy bilateral airspace opacities with peripheral predominance. This is worsened since prior study concerning for multifocal pneumonia. Electronically Signed   By: Rolm Baptise M.D.   On: 01/26/2019 09:14

## 2019-01-26 NOTE — Progress Notes (Signed)
ICU follow-up: CN informed of RR calls overnight.  Pt on HFNC 12L - increased to 15L d/t O2 in 80's.  Pt does not endorse any SOB.  Lung sounds show fine crackles left anterior lobes and bilaterally in bases.  Provided education on IS and had patient return demonstration.  Reinforced need to use 10xhour.

## 2019-01-26 NOTE — TOC Initial Note (Signed)
Transition of Care [facility]) - Initial/Assessment Note    Patient Details  Name: Nathan Perkins MRN: [id number] Date of Birth: 08/27/1955  Transition of Care [facility]) CM/SW Contact:    Ninfa Meeker, RN Phone Number: 01/26/2019, 12:58 PM  Clinical Narrative:   Patient is a 63 y.o. male with PMHx of insulin-dependent DM-2, HTN presented with 1 week history of cough, fever, myalgias and worsening shortness of breath.  Found to have acute hypoxic respiratory failure in the setting of COVID-19 pneumonia. Presently on 15L HFNC, Remdesivir, IV steroids. Case manager will continue to monitor for needs as patient medically improves. May he be blessed to do so.                     Patient Goals and CMS Choice        Expected Discharge Plan and Services                                                Prior Living Arrangements/Services                       Activities of Daily Living Home Assistive Devices/Equipment: None ADL Screening (condition at time of admission) Patient's cognitive ability adequate to safely complete daily activities?: Yes Is the patient deaf or have difficulty hearing?: No Does the patient have difficulty seeing, even when wearing glasses/contacts?: No Does the patient have difficulty concentrating, remembering, or making decisions?: No Patient able to express need for assistance with ADLs?: Yes Does the patient have difficulty dressing or bathing?: No Independently performs ADLs?: Yes (appropriate for developmental age) Does the patient have difficulty walking or climbing stairs?: No Weakness of Legs: None Weakness of Arms/Hands: None  Permission Sought/Granted                  Emotional Assessment              Admission diagnosis:  Acute respiratory failure due to COVID-19 ([employer]) [U07.1, J96.00] Patient Active Problem List   Diagnosis Date Noted  . Pneumonia due to COVID-19 virus 01/25/2019  . Lumbar disc herniation  07/30/2017  . Abnormal nuclear cardiac imaging test   . Pain in the chest   . Abnormal nuclear stress test 01/03/2015  . Heme positive stool 02/16/2013   PCP:  Monico Blitz, MD Pharmacy:   [facility] [address], [place] [address] Phone: [phone] Fax: [phone]     Social Determinants of Health (SDOH) Interventions    Readmission Risk Interventions No flowsheet data found.

## 2019-01-26 NOTE — Progress Notes (Signed)
VO from MD to give 20 units Novolog and 10 am Levemir dose of insulin for BS 439

## 2019-01-27 LAB — BPAM FFP
Blood Product Expiration Date: 202010230505
ISSUE DATE / TIME: 202010220906
Unit Type and Rh: 6200

## 2019-01-27 LAB — CBC WITH DIFFERENTIAL/PLATELET
Abs Immature Granulocytes: 0.07 10*3/uL (ref 0.00–0.07)
Basophils Absolute: 0 10*3/uL (ref 0.0–0.1)
Basophils Relative: 0 %
Eosinophils Absolute: 0 10*3/uL (ref 0.0–0.5)
Eosinophils Relative: 0 %
HCT: 42.6 % (ref 39.0–52.0)
Hemoglobin: 14 g/dL (ref 13.0–17.0)
Immature Granulocytes: 1 %
Lymphocytes Relative: 5 %
Lymphs Abs: 0.5 10*3/uL — ABNORMAL LOW (ref 0.7–4.0)
MCH: 26.8 pg (ref 26.0–34.0)
MCHC: 32.9 g/dL (ref 30.0–36.0)
MCV: 81.5 fL (ref 80.0–100.0)
Monocytes Absolute: 0.5 10*3/uL (ref 0.1–1.0)
Monocytes Relative: 4 %
Neutro Abs: 9.3 10*3/uL — ABNORMAL HIGH (ref 1.7–7.7)
Neutrophils Relative %: 90 %
Platelets: 286 10*3/uL (ref 150–400)
RBC: 5.23 MIL/uL (ref 4.22–5.81)
RDW: 12.1 % (ref 11.5–15.5)
WBC: 10.3 10*3/uL (ref 4.0–10.5)
nRBC: 0 % (ref 0.0–0.2)

## 2019-01-27 LAB — GLUCOSE, CAPILLARY
Glucose-Capillary: 244 mg/dL — ABNORMAL HIGH (ref 70–99)
Glucose-Capillary: 366 mg/dL — ABNORMAL HIGH (ref 70–99)
Glucose-Capillary: 363 mg/dL — ABNORMAL HIGH (ref 70–99)
Glucose-Capillary: 251 mg/dL — ABNORMAL HIGH (ref 70–99)

## 2019-01-27 LAB — PREPARE FRESH FROZEN PLASMA

## 2019-01-27 LAB — COMPREHENSIVE METABOLIC PANEL
ALT: 27 U/L (ref 0–44)
AST: 37 U/L (ref 15–41)
Albumin: 3.1 g/dL — ABNORMAL LOW (ref 3.5–5.0)
Alkaline Phosphatase: 45 U/L (ref 38–126)
Anion gap: 10 (ref 5–15)
BUN: 36 mg/dL — ABNORMAL HIGH (ref 8–23)
CO2: 28 mmol/L (ref 22–32)
Calcium: 8.7 mg/dL — ABNORMAL LOW (ref 8.9–10.3)
Chloride: 104 mmol/L (ref 98–111)
Creatinine, Ser: 0.96 mg/dL (ref 0.61–1.24)
GFR calc Af Amer: >60 mL/min (ref >60)
GFR calc non Af Amer: >60 mL/min (ref >60)
Glucose, Bld: 217 mg/dL — ABNORMAL HIGH (ref 70–99)
Potassium: 4.3 mmol/L (ref 3.5–5.1)
Sodium: 142 mmol/L (ref 135–145)
Total Bilirubin: 0.6 mg/dL (ref 0.3–1.2)
Total Protein: 6.5 g/dL (ref 6.5–8.1)

## 2019-01-27 LAB — D-DIMER, QUANTITATIVE: D-Dimer, Quant: 0.55 ug/mL-FEU — ABNORMAL HIGH (ref 0.00–0.50)

## 2019-01-27 LAB — C-REACTIVE PROTEIN: CRP: 6.7 mg/dL — ABNORMAL HIGH (ref <1.0)

## 2019-01-27 MED ORDER — INSULIN ASPART 100 UNIT/ML ~~LOC~~ SOLN
18.0000 [IU] | Freq: Three times a day (TID) | SUBCUTANEOUS | Status: DC
  Administered 2019-01-27 – 2019-01-29 (×9): 18 [IU] via SUBCUTANEOUS

## 2019-01-27 MED ORDER — INSULIN DETEMIR 100 UNIT/ML ~~LOC~~ SOLN
48.0000 [IU] | Freq: Two times a day (BID) | SUBCUTANEOUS | Status: DC
  Administered 2019-01-27 – 2019-01-29 (×6): 48 [IU] via SUBCUTANEOUS
  Filled 2019-01-27 (×7): qty 0.48

## 2019-01-27 MED ORDER — FUROSEMIDE 10 MG/ML IJ SOLN
40.0000 mg | Freq: Once | INTRAMUSCULAR | Status: AC
  Administered 2019-01-27: 40 mg via INTRAVENOUS
  Filled 2019-01-27: qty 4

## 2019-01-27 NOTE — Progress Notes (Signed)
PROGRESS NOTE                                                                                                                                                                                                             Patient Demographics:    Nathan Perkins, is a 63 y.o. male, DOB - 1955/11/18, [id number]  Outpatient Primary MD for the patient is Monico Blitz, MD   Admit date - 01/24/2019   LOS - 2  Chief Complaint  Patient presents with  . Shortness of Breath       Brief Narrative: Patient is a 63 y.o. male with PMHx of insulin-dependent DM-2, HTN presented with 1 week history of cough, fever, myalgias and worsening shortness of breath.  Patient was found to have acute hypoxic respiratory failure in setting of COVID-19 pneumonia at [facility] course was complicated by rapidly worsening respiratory failure in spite of being treated with steroids, remdesivir and Actemra.  See below for further details.   Subjective:    Nathan Perkins feels better this morning-he is less short of breath with minimal activity.  He also claims that he now thinks that his finger nail beds are now pink in color yesterday apparently it was purple.   Assessment  & Plan :   Acute Hypoxic Resp Failure due to Covid 19 Viral pneumonia: Continues to have severe hypoxemia but appears overall comfortable.  Remains on 15 L of oxygen.  CRP is trending down.  Continue steroids and remdesivir.  Patient is s/p convalescent plasma and Actemra. Since he is relatively stable-although severely hypoxic-plan is to watch him closely-if he develops any signs of tachypnea, confusion or his hypoxemia worsens-we will require ICU transfer.  Fever: afebrile  O2 requirements: On 15 l/m   COVID-19 Labs: Recent Labs    01/24/19 2136 01/25/19 0835 01/26/19 0555 01/27/19 0205  DDIMER 0.80* 0.68* 0.58* 0.55*  FERRITIN 456* 472*  --   --   LDH  340*  --   --   --   CRP 17.3* 17.3* 11.6* 6.7*    Lab Results  Component Value Date   SARSCOV2NAA POSITIVE (A) 01/24/2019     COVID-19 Medications: Steroids: 10/20>> Remdesivir: 10/21>> Actemra:10/21 x 1 Convalescent Plasma: 10/22 Research Studies:N/A  Other medications: Diuretics:Euvolemic-repeat Lasix 40 mg x 1 today. Antibiotics:Not needed as no evidence of  bacterial infection  Prone/Incentive Spirometry: encouraged to prone 3 hours at a time up to 16 hours a day, encouraged incentive spirometry use 3-4/hour.  DVT Prophylaxis  :  Lovenox   Insulin-dependent DM-2: CBGs mains uncontrolled-increase Levemir to 48 units twice daily, increase premeal NovoLog to 18 units 3 times daily-continue resistant SSI.  Follow and adjust.    HTN: BP relatively controlled-continue amlodipine and follow  ABG: No results found for: PHART, PCO2ART, PO2ART, HCO3, TCO2, ACIDBASEDEF, O2SAT  Vent Settings: None  Condition - Extremely Guarded  Family Communication  : Left voicemail for spouse on 10/23.  Code Status :  Full Code  Diet :  Diet Order            Diet Carb Modified Fluid consistency: Thin; Room service appropriate? Yes  Diet effective now               Disposition Plan  :  Remain hospitalized  Barriers to discharge: Hypoxemia  Consults  :  None  Procedures  :  None  Antibiotics  :    Anti-infectives (From admission, onward)   Start     Dose/Rate Route Frequency Ordered Stop   01/26/19 1600  remdesivir 100 mg in sodium chloride 0.9 % 250 mL IVPB     100 mg 500 mL/hr over 30 Minutes Intravenous Every 24 hours 01/25/19 1445 01/30/19 1559   01/25/19 1600  remdesivir 200 mg in sodium chloride 0.9 % 250 mL IVPB     200 mg 500 mL/hr over 30 Minutes Intravenous Once 01/25/19 1445 01/25/19 1727      Inpatient Medications  Scheduled Meds: . sodium chloride   Intravenous Once  . acetaminophen  650 mg Oral Once  . albuterol  2 puff Inhalation Q6H  . amLODipine  5  mg Oral Daily  . aspirin  81 mg Oral Daily  . benzonatate  200 mg Oral TID  . diphenhydrAMINE  25 mg Intravenous Once  . enoxaparin (LOVENOX) injection  40 mg Subcutaneous Q24H  . guaiFENesin  600 mg Oral BID  . insulin aspart  0-20 Units Subcutaneous TID WC  . insulin aspart  0-5 Units Subcutaneous QHS  . insulin aspart  18 Units Subcutaneous TID WC  . insulin detemir  48 Units Subcutaneous BID  . methylPREDNISolone (SOLU-MEDROL) injection  60 mg Intravenous Q12H  . pantoprazole  40 mg Oral Daily  . vitamin C  1,000 mg Oral Daily  . zinc sulfate  220 mg Oral Daily   Continuous Infusions: . sodium chloride Stopped (01/25/19 2211)  . remdesivir 100 mg in NS 250 mL Stopped (01/26/19 1701)   PRN Meds:.albuterol, chlorpheniramine-HYDROcodone, labetalol, loperamide, ondansetron **OR** ondansetron (ZOFRAN) IV   Time Spent in minutes  35  See all Orders from today for further details   Oren Binet M.D on 01/27/2019 at 1:41 PM  To page go to [email or web address] - use universal password  [facility] -  Office  [phone]    Objective:   Vitals:   01/26/19 2320 01/27/19 0350 01/27/19 0731 01/27/19 1137  BP: 114/64 (!) 138/92 130/87 (!) 141/77  Pulse: 75 92 91 95  Resp: 20 20 (!) 21 (!) 23  Temp: 98.2 F (36.8 C) 97.8 F (36.6 C) 98.1 F (36.7 C) 98 F (36.7 C)  TempSrc: Oral Oral Oral Oral  SpO2: 98% 91% 95% 92%  Weight:      Height:        Wt Readings from Last 3 Encounters:  01/25/19 90.1 kg  07/30/17 90.7 kg  01/07/15 92.5 kg     Intake/Output Summary (Last 24 hours) at 01/27/2019 1341 Last data filed at 01/27/2019 1224 Gross per 24 hour  Intake 1475.69 ml  Output 2025 ml  Net -549.31 ml     Physical Exam Gen Exam:Alert awake-not in any distress HEENT:atraumatic, normocephalic Chest: B/L clear to auscultation anteriorly CVS:S1S2 regular Abdomen:soft non tender, non distended Extremities:no edema Neurology: Non focal Skin: no rash   Data  Review:    CBC Recent Labs  Lab 01/24/19 2136 01/25/19 0835 01/26/19 0555 01/27/19 0205  WBC 5.1 4.2 6.1 10.3  HGB 14.5 14.2 14.0 14.0  HCT 44.2 44.5 42.7 42.6  PLT 199 210 237 286  MCV 82.5 84.6 82.6 81.5  MCH 27.1 27.0 27.1 26.8  MCHC 32.8 31.9 32.8 32.9  RDW 12.8 12.6 12.4 12.1  LYMPHSABS 0.9  --  0.5* 0.5*  MONOABS 0.4  --  0.3 0.5  EOSABS 0.0  --  0.0 0.0  BASOSABS 0.0  --  0.0 0.0    Chemistries  Recent Labs  Lab 01/24/19 2136 01/25/19 0835 01/26/19 0555 01/27/19 0205  NA 131* 138 138 142  K 4.0 4.8 5.2* 4.3  CL 98 100 104 104  CO2 23 23 24 28   GLUCOSE 306* 356* 345* 217*  BUN 21 27* 34* 36*  CREATININE 1.05 1.02 1.01 0.96  CALCIUM 8.3* 8.7* 8.7* 8.7*  AST 52* 44* 37 37  ALT 29 27 25 27   ALKPHOS 41 41 42 45  BILITOT 0.5 0.9 0.5 0.6   ------------------------------------------------------------------------------------------------------------------ Recent Labs    01/24/19 2136  TRIG 98    Lab Results  Component Value Date   HGBA1C 8.6 (H) 01/24/2019   ------------------------------------------------------------------------------------------------------------------ No results for input(s): TSH, T4TOTAL, T3FREE, THYROIDAB in the last 72 hours.  Invalid input(s): FREET3 ------------------------------------------------------------------------------------------------------------------ Recent Labs    01/24/19 2136 01/25/19 0835  FERRITIN 456* 472*    Coagulation profile No results for input(s): INR, PROTIME in the last 168 hours.  Recent Labs    01/26/19 0555 01/27/19 0205  DDIMER 0.58* 0.55*    Cardiac Enzymes No results for input(s): CKMB, TROPONINI, MYOGLOBIN in the last 168 hours.  Invalid input(s): CK ------------------------------------------------------------------------------------------------------------------    Component Value Date/Time   BNP 82.0 01/25/2019 0836    Micro Results Recent Results (from the past 240  hour(s))  Blood Culture (routine x 2)     Status: None (Preliminary result)   Collection Time: 01/24/19  9:36 PM   Specimen: Left Antecubital; Blood  Result Value Ref Range Status   Specimen Description LEFT ANTECUBITAL  Final   Special Requests   Final    BOTTLES DRAWN AEROBIC AND ANAEROBIC Blood Culture adequate volume   Culture   Final    NO GROWTH 3 DAYS Performed at [facility], [address] [place], [place] [address]    Report Status PENDING  Incomplete  Blood Culture (routine x 2)     Status: None (Preliminary result)   Collection Time: 01/24/19 10:44 PM   Specimen: BLOOD LEFT HAND  Result Value Ref Range Status   Specimen Description BLOOD LEFT HAND  Final   Special Requests   Final    BOTTLES DRAWN AEROBIC AND ANAEROBIC Blood Culture results may not be optimal due to an excessive volume of blood received in culture bottles   Culture   Final    NO GROWTH 2 DAYS Performed at [facility], [address] [place], [place] [address]  Report Status PENDING  Incomplete  SARS Coronavirus 2 by RT PCR (hospital order, performed in [facility] hospital lab) Nasopharyngeal Nasopharyngeal Swab     Status: Abnormal   Collection Time: 01/24/19 10:45 PM   Specimen: Nasopharyngeal Swab  Result Value Ref Range Status   SARS Coronavirus 2 POSITIVE (A) NEGATIVE Final    Comment: RESULT CALLED TO, READ BACK BY AND VERIFIED WITH: L ANDREW,RN @2345  01/24/19 MKELLY (NOTE) If result is NEGATIVE SARS-CoV-2 target nucleic acids are NOT DETECTED. The SARS-CoV-2 RNA is generally detectable in upper and lower  respiratory specimens during the acute phase of infection. The lowest  concentration of SARS-CoV-2 viral copies this assay can detect is 250  copies / mL. A negative result does not preclude SARS-CoV-2 infection  and should not be used as the sole basis for treatment or other  patient management decisions.  A negative result may occur with  improper specimen collection / handling,  submission of specimen other  than nasopharyngeal swab, presence of viral mutation(s) within the  areas targeted by this assay, and inadequate number of viral copies  (<250 copies / mL). A negative result must be combined with clinical  observations, patient history, and epidemiological information. If result is POSITIVE SARS-CoV-2 target nucleic acids are DETECTED. The  SARS-CoV-2 RNA is generally detectable in upper and lower  respiratory specimens during the acute phase of infection.  Positive  results are indicative of active infection with SARS-CoV-2.  Clinical  correlation with patient history and other diagnostic information is  necessary to determine patient infection status.  Positive results do  not rule out bacterial infection or co-infection with other viruses. If result is PRESUMPTIVE POSTIVE SARS-CoV-2 nucleic acids MAY BE PRESENT.   A presumptive positive result was obtained on the submitted specimen  and confirmed on repeat testing.  While 2019 novel coronavirus  (SARS-CoV-2) nucleic acids may be present in the submitted sample  additional confirmatory testing may be necessary for epidemiological  and / or clinical management purposes  to differentiate between  SARS-CoV-2 and other Sarbecovirus currently known to infect humans.  If clinically indicated additional testing with an alternate test  methodology [phone]) is a dvised. The SARS-CoV-2 RNA is generally  detectable in upper and lower respiratory specimens during the acute  phase of infection. The expected result is Negative. Fact Sheet for Patients:  StrictlyIdeas.no Fact Sheet for Healthcare Providers: BankingDealers.co.za This test is not yet approved or cleared by the [place] FDA and has been authorized for detection and/or diagnosis of SARS-CoV-2 by FDA under an Emergency Use Authorization (EUA).  This EUA will remain in effect (meaning this test can be  used) for the duration of the COVID-19 declaration under Section 564(b)(1) of the Act, 21 U.S.C. section 360bbb-3(b)(1), unless the authorization is terminated or revoked sooner. Performed at [facility], [address], [place], [place] [address]     Radiology Reports Dg Chest [place] 1 View  Result Date: 01/24/2019 CLINICAL DATA:  Difficulty breathing, concern for COVID-19 EXAM: PORTABLE CHEST 1 VIEW COMPARISON:  Radiograph 01/23/2019 FINDINGS: Worsening bilateral interstitial and airspace opacities throughout both lungs. Peripheral predominance most notable in the left lung base. Lung volumes are diminished with streaky areas of atelectasis as well. No pneumothorax or visible effusion. Cardiomediastinal contours are stable accounting for differences in technique and lung volumes. Degenerative changes are present in the imaged spine and shoulders. Cervical spinal fusion hardware is incompletely assessed. IMPRESSION: Worsening bilateral interstitial and airspace opacities throughout both lungs, compatible  with a multifocal pneumonia such as atypical viral pneumonia including COVID-19. Electronically Signed   By: Lovena Le M.D.   On: 01/24/2019 22:17   Dg Chest Port 1v Same Day  Result Date: 01/26/2019 CLINICAL DATA:  Shortness of breath EXAM: PORTABLE CHEST 1 VIEW COMPARISON:  01/24/2019 FINDINGS: Heart is borderline enlarged. There diffuse bilateral airspace opacities noted with some peripheral predominance. This is worsened since prior study. Low lung volumes. No visible effusions. IMPRESSION: Cardiomegaly. Patchy bilateral airspace opacities with peripheral predominance. This is worsened since prior study concerning for multifocal pneumonia. Electronically Signed   By: Rolm Baptise M.D.   On: 01/26/2019 09:14

## 2019-01-27 NOTE — Progress Notes (Signed)
Wife called, she is updated and all questions answered.  

## 2019-01-28 DIAGNOSIS — R0602 Shortness of breath: Secondary | ICD-10-CM

## 2019-01-28 LAB — COMPREHENSIVE METABOLIC PANEL
ALT: 47 U/L — ABNORMAL HIGH (ref 0–44)
AST: 59 U/L — ABNORMAL HIGH (ref 15–41)
Albumin: 3.4 g/dL — ABNORMAL LOW (ref 3.5–5.0)
Alkaline Phosphatase: 52 U/L (ref 38–126)
Anion gap: 14 (ref 5–15)
BUN: 40 mg/dL — ABNORMAL HIGH (ref 8–23)
CO2: 28 mmol/L (ref 22–32)
Calcium: 8.8 mg/dL — ABNORMAL LOW (ref 8.9–10.3)
Chloride: 101 mmol/L (ref 98–111)
Creatinine, Ser: 0.93 mg/dL (ref 0.61–1.24)
GFR calc Af Amer: >60 mL/min (ref >60)
GFR calc non Af Amer: >60 mL/min (ref >60)
Glucose, Bld: 112 mg/dL — ABNORMAL HIGH (ref 70–99)
Potassium: 4.1 mmol/L (ref 3.5–5.1)
Sodium: 143 mmol/L (ref 135–145)
Total Bilirubin: 0.9 mg/dL (ref 0.3–1.2)
Total Protein: 6.6 g/dL (ref 6.5–8.1)

## 2019-01-28 LAB — GLUCOSE, CAPILLARY
Glucose-Capillary: 204 mg/dL — ABNORMAL HIGH (ref 70–99)
Glucose-Capillary: 268 mg/dL — ABNORMAL HIGH (ref 70–99)
Glucose-Capillary: 153 mg/dL — ABNORMAL HIGH (ref 70–99)
Glucose-Capillary: 134 mg/dL — ABNORMAL HIGH (ref 70–99)

## 2019-01-28 LAB — CBC WITH DIFFERENTIAL/PLATELET
Abs Immature Granulocytes: 0.09 10*3/uL — ABNORMAL HIGH (ref 0.00–0.07)
Basophils Absolute: 0 10*3/uL (ref 0.0–0.1)
Basophils Relative: 0 %
Eosinophils Absolute: 0 10*3/uL (ref 0.0–0.5)
Eosinophils Relative: 0 %
HCT: 46.2 % (ref 39.0–52.0)
Hemoglobin: 15.1 g/dL (ref 13.0–17.0)
Immature Granulocytes: 1 %
Lymphocytes Relative: 5 %
Lymphs Abs: 0.6 10*3/uL — ABNORMAL LOW (ref 0.7–4.0)
MCH: 26.9 pg (ref 26.0–34.0)
MCHC: 32.7 g/dL (ref 30.0–36.0)
MCV: 82.4 fL (ref 80.0–100.0)
Monocytes Absolute: 0.7 10*3/uL (ref 0.1–1.0)
Monocytes Relative: 6 %
Neutro Abs: 10.6 10*3/uL — ABNORMAL HIGH (ref 1.7–7.7)
Neutrophils Relative %: 88 %
Platelets: 318 10*3/uL (ref 150–400)
RBC: 5.61 MIL/uL (ref 4.22–5.81)
RDW: 12.1 % (ref 11.5–15.5)
WBC: 12 10*3/uL — ABNORMAL HIGH (ref 4.0–10.5)
nRBC: 0 % (ref 0.0–0.2)

## 2019-01-28 LAB — D-DIMER, QUANTITATIVE: D-Dimer, Quant: 0.61 ug/mL-FEU — ABNORMAL HIGH (ref 0.00–0.50)

## 2019-01-28 LAB — C-REACTIVE PROTEIN: CRP: 4 mg/dL — ABNORMAL HIGH (ref <1.0)

## 2019-01-28 MED ORDER — TRAMADOL HCL 50 MG PO TABS
50.0000 mg | ORAL_TABLET | Freq: Four times a day (QID) | ORAL | Status: DC | PRN
  Administered 2019-01-28: 50 mg via ORAL
  Filled 2019-01-28: qty 1

## 2019-01-28 MED ORDER — CLONAZEPAM 0.25 MG PO TBDP: 0.2500 mg | ORAL_TABLET | Freq: Two times a day (BID) | ORAL | Status: DC

## 2019-01-28 MED ORDER — LORAZEPAM 2 MG/ML IJ SOLN: 0.5000 mg | Freq: Once | INTRAMUSCULAR | Status: DC

## 2019-01-28 MED ORDER — CLONAZEPAM 0.5 MG PO TBDP: 0.5000 mg | ORAL_TABLET | Freq: Two times a day (BID) | ORAL | Status: DC

## 2019-01-28 NOTE — Evaluation (Deleted)
Physical Therapy Evaluation Patient Details Name: Nathan Perkins MRN: [id number] DOB: Oct 22, 1955 Today's Date: 01/28/2019   History of Present Illness  63 y.o. male, with history of diabetes mellitus type 2, hypertension came to hospital with worsening shortness of breath.  O2 sats dropping to 80% on room air  Clinical Impression   Pt admitted with above diagnosis. 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 Pt does have hx of MS hence fatigues quickly.  Pt currently with functional limitations due to the deficits listed below (see PT Problem List). Pt will benefit from skilled PT to increase their independence and safety with mobility to allow discharge to the venue listed below.       Follow Up Recommendations No PT follow up(once d/c from hospital)    Equipment Recommendations  None recommended by PT    Recommendations for Other Services OT consult     Precautions / Restrictions Precautions Precautions: Fall Precaution Comments: has MS fatigues quickly Restrictions Weight Bearing Restrictions: No      Mobility  Bed Mobility Overal bed mobility: Needs Assistance Bed Mobility: Rolling;Supine to Sit Rolling: Modified independent (Device/Increase time)   Supine to sit: Supervision        Transfers Overall transfer level: Needs assistance Equipment used: Rolling walker (2 wheeled) Transfers: Sit to/from Stand Sit to Stand: Supervision            Ambulation/Gait Ambulation/Gait assistance: Min guard Gait Distance (Feet): 48 Feet Assistive device: Rolling walker (2 wheeled) Gait Pattern/deviations: Step-through pattern Gait velocity: very slow   General Gait Details: ambulates very slow and nees standing rest break to complete. Pt on 4L/min via [place] and desat to 84% with ambulation but was able to recover in under 1 min with pursed lip breathing.  Stairs            Wheelchair Mobility    Modified Rankin (Stroke Patients Only)       Balance Overall balance assessment: Needs assistance Sitting-balance support: Feet supported Sitting balance-Leahy Scale: Good     Standing balance support: Bilateral upper extremity supported Standing balance-Leahy Scale: Fair                               Pertinent Vitals/Pain Pain Assessment: 0-10 Pain Location: states that has some pain in chest when he coughs Pain Descriptors / Indicators: Discomfort Pain Intervention(s): Limited activity within patient's tolerance    Home Living Family/patient expects to be discharged to:: Private residence Living Arrangements: Spouse/significant other Available Help at Discharge: Family Type of Home: House Home Access: Level entry     Home Layout: One level Home Equipment: Environmental consultant - 2 wheels;Cane - single point Additional Comments: states uses RW when has MS flareups, these occue once year or a year and a half    Prior Function Level of Independence: Independent               Hand Dominance        Extremity/Trunk Assessment   Upper Extremity Assessment Upper Extremity Assessment: Defer to OT evaluation    Lower Extremity Assessment Lower Extremity Assessment: Generalized weakness    Cervical / Trunk Assessment Cervical / Trunk Assessment: Normal  Communication   Communication: No difficulties  Cognition Arousal/Alertness: Awake/alert Behavior During Therapy: WFL for tasks assessed/performed Overall Cognitive Status: Within Functional Limits for tasks assessed  General Comments General comments (skin integrity, edema, etc.): Pt states was I prior to hospitalization and was working as a Education officer, museum. He has a hx of MS hence fatigues very quickly. Pt was able to complete all functionla mob tasks with CGA-SBA. he was on 4L/min and did desat to 84% min with ambulation 32ft in room.    Exercises Other Exercises Other Exercises: instructed on use of  icentive spirometer and also flutter valve.   Assessment/Plan    PT Assessment Patient needs continued PT services(while in hospital)  PT Problem List Decreased strength;Decreased activity tolerance;Decreased mobility;Decreased balance;Decreased coordination       PT Treatment Interventions Gait training;Functional mobility training;Therapeutic exercise;Neuromuscular re-education;Patient/family education;Balance training;Therapeutic activities    PT Goals (Current goals can be found in the Care Plan section)  Acute Rehab PT Goals Time For Goal Achievement: 02/11/19 Potential to Achieve Goals: Good    Frequency Min 3X/week   Barriers to discharge        Co-evaluation               AM-PAC PT "6 Clicks" Mobility  Outcome Measure Help needed turning from your back to your side while in a flat bed without using bedrails?: None Help needed moving from lying on your back to sitting on the side of a flat bed without using bedrails?: A Little Help needed moving to and from a bed to a chair (including a wheelchair)?: A Little Help needed standing up from a chair using your arms (e.g., wheelchair or bedside chair)?: A Little Help needed to walk in hospital room?: A Little Help needed climbing 3-5 steps with a railing? : A Lot 6 Click Score: 18    End of Session   Activity Tolerance: Treatment limited secondary to medical complications (Comment) Patient left: in chair;with call bell/phone within reach   PT Visit Diagnosis: Other abnormalities of gait and mobility (R26.89);Muscle weakness (generalized) (M62.81)    Time: :[id number] PT Time Calculation (min) (ACUTE ONLY): 39 min   Charges:   PT Evaluation $PT Eval Moderate Complexity: 1 Mod PT Treatments $Therapeutic Activity: 8-22 mins        Horald Chestnut, PT   Delford Field 01/28/2019, 1:24 PM

## 2019-01-28 NOTE — Progress Notes (Signed)
Physical Therapy Evaluation Patient Details Name: Nathan Perkins MRN: [id number] DOB: 04-01-1956 Today's Date: 01/28/2019   History of Present Illness  63 y.o. male, with history of diabetes mellitus type 2, hypertension came to hospital with worsening shortness of breath.  O2 sats dropping to 80% on room air  Clinical Impression   Pt admitted with above diagnosis. 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 Pt currently with functional limitations due to the deficits listed below (see PT Problem List). Functionally he is needing CGA-SBA with functional mobility and is able to ambulate with no AD, but he remains on 15L/min via HFNC to maintain sats >90 at rest. With mobility at 15L/min on HFNC still desats to mid 80s.  Pt will benefit from skilled PT to increase their independence and safety with mobility to allow discharge to the venue listed below.       Follow Up Recommendations No PT follow up(once d/c from hospital)    Equipment Recommendations  None recommended by PT    Recommendations for Other Services       Precautions / Restrictions Precautions Precautions: Fall Restrictions Weight Bearing Restrictions: No      Mobility  Bed Mobility Overal bed mobility: Needs Assistance(Pt up in recliner upon therapist arrival)                Transfers Overall transfer level: Needs assistance Equipment used: None Transfers: Sit to/from Stand Sit to Stand: Supervision            Ambulation/Gait Ambulation/Gait assistance: Min guard;Supervision Gait Distance (Feet): 44 Feet Assistive device: Rolling walker (2 wheeled);None Gait Pattern/deviations: Step-through pattern Gait velocity: very slow   General Gait Details: ambulated 2 x 82ft with no AD and CGA-SBA. Pt on 15L/min via HFNC on initial ambulation desat to 88% and on second attempt desat to 85% with cues able to recover within 2 mins  Stairs            Wheelchair Mobility    Modified  Rankin (Stroke Patients Only)       Balance Overall balance assessment: Needs assistance Sitting-balance support: Feet supported Sitting balance-Leahy Scale: Good     Standing balance support: During functional activity Standing balance-Leahy Scale: Good                               Pertinent Vitals/Pain Pain Assessment: No/denies pain    Home Living Family/patient expects to be discharged to:: Private residence Living Arrangements: Spouse/significant other Available Help at Discharge: Family Type of Home: House Home Access: Level entry     Home Layout: One level        Prior Function Level of Independence: Independent         Comments: works as a Management consultant Extremity Assessment Upper Extremity Assessment: Overall WFL for tasks assessed    Lower Extremity Assessment Lower Extremity Assessment: Generalized weakness    Cervical / Trunk Assessment Cervical / Trunk Assessment: Normal  Communication   Communication: No difficulties  Cognition Arousal/Alertness: Awake/alert Behavior During Therapy: WFL for tasks assessed/performed Overall Cognitive Status: Within Functional Limits for tasks assessed  General Comments General comments (skin integrity, edema, etc.): Pt was working as Administrator prior to hospitalization. Today he is on 15L/min via HFNC, at rest he is able to maintain sats in 90s but with activity does desat to low 80s. Has some difficulty with pursed lip breathing as he states he is a mouth breather. also had some difficulty with incentive spirometer.    Exercises Other Exercises Other Exercises: worked on Chiropodist and also fllutter valve   Assessment/Plan    PT Assessment Patient needs continued PT services(while in hospital)  PT Problem List Decreased strength;Decreased activity  tolerance;Decreased mobility;Decreased balance;Decreased coordination;Decreased safety awareness       PT Treatment Interventions Gait training;Functional mobility training;Therapeutic exercise;Neuromuscular re-education;Patient/family education;Balance training;Therapeutic activities    PT Goals (Current goals can be found in the Care Plan section)  Acute Rehab PT Goals Patient Stated Goal: get better and get out of hospital PT Goal Formulation: With patient Time For Goal Achievement: 02/11/19 Potential to Achieve Goals: Good    Frequency Min 3X/week   Barriers to discharge        Co-evaluation               AM-PAC PT "6 Clicks" Mobility  Outcome Measure Help needed turning from your back to your side while in a flat bed without using bedrails?: A Little Help needed moving from lying on your back to sitting on the side of a flat bed without using bedrails?: A Little Help needed moving to and from a bed to a chair (including a wheelchair)?: A Little Help needed standing up from a chair using your arms (e.g., wheelchair or bedside chair)?: A Little Help needed to walk in hospital room?: A Little Help needed climbing 3-5 steps with a railing? : A Lot 6 Click Score: 17    End of Session Equipment Utilized During Treatment: Oxygen Activity Tolerance: Treatment limited secondary to medical complications (Comment) Patient left: in chair;with call bell/phone within reach   PT Visit Diagnosis: Other abnormalities of gait and mobility (R26.89);Muscle weakness (generalized) (M62.81)    Time: [id number] PT Time Calculation (min) (ACUTE ONLY): 28 min   Charges:   PT Evaluation $PT Eval Moderate Complexity: 1 Mod PT Treatments $Gait Training: 8-22 mins        Horald Chestnut, PT   Delford Field 01/28/2019, 1:42 PM

## 2019-01-28 NOTE — Progress Notes (Signed)
Pt. Is anxious, cant get comfortable, labor breathing. O2 sats 80's at 15 L high flow. Proned patient. Will continuo to monitor.

## 2019-01-28 NOTE — Progress Notes (Signed)
PROGRESS NOTE                                                                                                                                                                                                             Patient Demographics:    Nathan Perkins, is a 63 y.o. male, DOB - 1955-04-28, [id number]  Outpatient Primary MD for the patient is Monico Blitz, MD   Admit date - 01/24/2019   LOS - 3  Chief Complaint  Patient presents with  . Shortness of Breath       Brief Narrative: Patient is a 63 y.o. male with PMHx of insulin-dependent DM-2, HTN presented with 1 week history of cough, fever, myalgias and worsening shortness of breath.  Patient was found to have acute hypoxic respiratory failure in setting of COVID-19 pneumonia at [facility] course was complicated by rapidly worsening respiratory failure in spite of being treated with steroids, remdesivir and Actemra.  See below for further details.   Subjective:   Patient in bed, appears comfortable, denies any headache, no fever, no chest pain or pressure, does have a cough but improving shortness of breath , no abdominal pain. No focal weakness.  Says he feels anxious but is in no discomfort.  He says he feels better than what he was doing 2 days ago and yesterday. He did not sleep at all last night and is a little sleepy.   Assessment  & Plan :   Acute Hypoxic Resp Failure due to Covid 19 Viral pneumonia: he has severe COVID-19 pneumonitis and acute hypoxic respiratory failure, still on 15 L high flow nasal cannula oxygen but now somewhat improving, breathing rate is about 15-18 a minute and he appears to be in no distress.  Able to complete full sentences and says he is feeling better in the last 2 days.  He has so far received full treatment including IV steroids, remdesivir, Actemra, convalescent plasma.  His inflammatory markers are finally  trending down and he appears to have stabilized except for some ongoing anxiety.  He was encouraged to sit up in chair and daytime use flutter valve and I-S for many toiletry and prone when in bed.    COVID-19 Labs: Recent Labs    01/26/19 0555 01/27/19 0205 01/28/19 0020  DDIMER 0.58* 0.55* 0.61*  CRP 11.6* 6.7*  4.0*    Lab Results  Component Value Date   SARSCOV2NAA POSITIVE (A) 01/24/2019     COVID-19 Medications:  Steroids: 10/20>> Remdesivir: 10/21>> Actemra:10/21 x 1 Convalescent Plasma: 10/22 Research Studies:N/A   HTN: BP relatively controlled-continue amlodipine and follow  Extreme anxiety.  If continues will add Klonopin.  He did not sleep well night of 01/27/2019 will add a sleeping aid night of 01/28/2019.  Insulin-dependent DM-2: CBGs mains uncontrolled-increase Levemir to 48 units twice daily, increase premeal NovoLog to 18 units 3 times daily-continue resistant SSI.  Follow and adjust.    Lab Results  Component Value Date   HGBA1C 8.6 (H) 01/24/2019   CBG (last 3)  Recent Labs    01/27/19 1618 01/27/19 2050 01/28/19 0817  GLUCAP 363* 251* 204*     Condition - Extremely Guarded  Family Communication  : Left voicemail for spouse on 10/23.  Code Status :  Full Code  Diet :  Diet Order            Diet Carb Modified Fluid consistency: Thin; Room service appropriate? Yes  Diet effective now               Disposition Plan  :  Remain hospitalized  Barriers to discharge: Hypoxemia  Consults  :  None  Procedures  :  None  Antibiotics  :    Anti-infectives (From admission, onward)   Start     Dose/Rate Route Frequency Ordered Stop   01/26/19 1600  remdesivir 100 mg in sodium chloride 0.9 % 250 mL IVPB     100 mg 500 mL/hr over 30 Minutes Intravenous Every 24 hours 01/25/19 1445 01/30/19 1559   01/25/19 1600  remdesivir 200 mg in sodium chloride 0.9 % 250 mL IVPB     200 mg 500 mL/hr over 30 Minutes Intravenous Once 01/25/19 1445  01/25/19 1727      Inpatient Medications  Scheduled Meds: . acetaminophen  650 mg Oral Once  . albuterol  2 puff Inhalation Q6H  . amLODipine  5 mg Oral Daily  . aspirin  81 mg Oral Daily  . benzonatate  200 mg Oral TID  . clonazepam  0.5 mg Oral BID  . diphenhydrAMINE  25 mg Intravenous Once  . guaiFENesin  600 mg Oral BID  . insulin aspart  0-20 Units Subcutaneous TID WC  . insulin aspart  0-5 Units Subcutaneous QHS  . insulin aspart  18 Units Subcutaneous TID WC  . insulin detemir  48 Units Subcutaneous BID  . LORazepam  0.5 mg Intravenous Once  . methylPREDNISolone (SOLU-MEDROL) injection  60 mg Intravenous Q12H  . pantoprazole  40 mg Oral Daily  . vitamin C  1,000 mg Oral Daily  . zinc sulfate  220 mg Oral Daily   Continuous Infusions: . sodium chloride Stopped (01/25/19 2211)  . remdesivir 100 mg in NS 250 mL 100 mg (01/27/19 1611)   PRN Meds:.albuterol, chlorpheniramine-HYDROcodone, labetalol, loperamide, [DISCONTINUED] ondansetron **OR** ondansetron (ZOFRAN) IV, traMADol   Time Spent in minutes  35  See all Orders from today for further details   Lala Lund M.D on 01/28/2019 at 9:41 AM  To page go to [email or web address] - use universal password  [facility] -  Office  [phone]    Objective:   Vitals:   01/28/19 0319 01/28/19 0353 01/28/19 0600 01/28/19 0800  BP:  129/89 102/66 123/89  Pulse: 91 98 71 98  Resp: (!) 24 (!) 25 17 18   Temp:  97.9 F (  36.6 C)  97.9 F (36.6 C)  TempSrc:  Oral  Oral  SpO2: 93% 90% 93% 96%  Weight:      Height:        Wt Readings from Last 3 Encounters:  01/25/19 90.1 kg  07/30/17 90.7 kg  01/07/15 92.5 kg     Intake/Output Summary (Last 24 hours) at 01/28/2019 0941 Last data filed at 01/28/2019 0000 Gross per 24 hour  Intake 1030 ml  Output 2900 ml  Net -1870 ml     Physical Exam  Awake Alert,  No new F.N deficits, anxious affect but overall comfortable able to talk in full sentences  [place].AT,PERRAL Supple Neck,No JVD, No cervical lymphadenopathy appriciated.  Symmetrical Chest wall movement, Good air movement bilaterally, mild crackles RRR,No Gallops, Rubs or new Murmurs, No Parasternal Heave +ve B.Sounds, Abd Soft, No tenderness, No organomegaly appriciated, No rebound - guarding or rigidity. No Cyanosis, Clubbing or edema, No new Rash or bruise    Data Review:    CBC Recent Labs  Lab 01/24/19 2136 01/25/19 0835 01/26/19 0555 01/27/19 0205 01/28/19 0020  WBC 5.1 4.2 6.1 10.3 12.0*  HGB 14.5 14.2 14.0 14.0 15.1  HCT 44.2 44.5 42.7 42.6 46.2  PLT 199 210 237 286 318  MCV 82.5 84.6 82.6 81.5 82.4  MCH 27.1 27.0 27.1 26.8 26.9  MCHC 32.8 31.9 32.8 32.9 32.7  RDW 12.8 12.6 12.4 12.1 12.1  LYMPHSABS 0.9  --  0.5* 0.5* 0.6*  MONOABS 0.4  --  0.3 0.5 0.7  EOSABS 0.0  --  0.0 0.0 0.0  BASOSABS 0.0  --  0.0 0.0 0.0    Chemistries  Recent Labs  Lab 01/24/19 2136 01/25/19 0835 01/26/19 0555 01/27/19 0205 01/28/19 0020  NA 131* 138 138 142 143  K 4.0 4.8 5.2* 4.3 4.1  CL 98 100 104 104 101  CO2 23 23 24 28 28   GLUCOSE 306* 356* 345* 217* 112*  BUN 21 27* 34* 36* 40*  CREATININE 1.05 1.02 1.01 0.96 0.93  CALCIUM 8.3* 8.7* 8.7* 8.7* 8.8*  AST 52* 44* 37 37 59*  ALT 29 27 25 27  47*  ALKPHOS 41 41 42 45 52  BILITOT 0.5 0.9 0.5 0.6 0.9   ------------------------------------------------------------------------------------------------------------------ No results for input(s): CHOL, HDL, LDLCALC, TRIG, CHOLHDL, LDLDIRECT in the last 72 hours.  Lab Results  Component Value Date   HGBA1C 8.6 (H) 01/24/2019   ------------------------------------------------------------------------------------------------------------------ No results for input(s): TSH, T4TOTAL, T3FREE, THYROIDAB in the last 72 hours.  Invalid input(s): FREET3 ------------------------------------------------------------------------------------------------------------------ No results for  input(s): VITAMINB12, FOLATE, FERRITIN, TIBC, IRON, RETICCTPCT in the last 72 hours.  Coagulation profile No results for input(s): INR, PROTIME in the last 168 hours.  Recent Labs    01/27/19 0205 01/28/19 0020  DDIMER 0.55* 0.61*    Cardiac Enzymes No results for input(s): CKMB, TROPONINI, MYOGLOBIN in the last 168 hours.  Invalid input(s): CK ------------------------------------------------------------------------------------------------------------------    Component Value Date/Time   BNP 82.0 01/25/2019 0836    Micro Results Recent Results (from the past 240 hour(s))  Blood Culture (routine x 2)     Status: None (Preliminary result)   Collection Time: 01/24/19  9:36 PM   Specimen: Left Antecubital; Blood  Result Value Ref Range Status   Specimen Description LEFT ANTECUBITAL  Final   Special Requests   Final    BOTTLES DRAWN AEROBIC AND ANAEROBIC Blood Culture adequate volume   Culture   Final    NO GROWTH 4 DAYS Performed  at [facility], [address] [place], [place] [address]    Report Status PENDING  Incomplete  Blood Culture (routine x 2)     Status: None (Preliminary result)   Collection Time: 01/24/19 10:44 PM   Specimen: BLOOD LEFT HAND  Result Value Ref Range Status   Specimen Description BLOOD LEFT HAND  Final   Special Requests   Final    BOTTLES DRAWN AEROBIC AND ANAEROBIC Blood Culture results may not be optimal due to an excessive volume of blood received in culture bottles   Culture   Final    NO GROWTH 3 DAYS Performed at [facility], [address] [place], [place] [address]    Report Status PENDING  Incomplete  SARS Coronavirus 2 by RT PCR (hospital order, performed in [facility] hospital lab) Nasopharyngeal Nasopharyngeal Swab     Status: Abnormal   Collection Time: 01/24/19 10:45 PM   Specimen: Nasopharyngeal Swab  Result Value Ref Range Status   SARS Coronavirus 2 POSITIVE (A) NEGATIVE Final    Comment: RESULT CALLED TO, READ BACK BY  AND VERIFIED WITH: L ANDREW,RN @2345  01/24/19 MKELLY (NOTE) If result is NEGATIVE SARS-CoV-2 target nucleic acids are NOT DETECTED. The SARS-CoV-2 RNA is generally detectable in upper and lower  respiratory specimens during the acute phase of infection. The lowest  concentration of SARS-CoV-2 viral copies this assay can detect is 250  copies / mL. A negative result does not preclude SARS-CoV-2 infection  and should not be used as the sole basis for treatment or other  patient management decisions.  A negative result may occur with  improper specimen collection / handling, submission of specimen other  than nasopharyngeal swab, presence of viral mutation(s) within the  areas targeted by this assay, and inadequate number of viral copies  (<250 copies / mL). A negative result must be combined with clinical  observations, patient history, and epidemiological information. If result is POSITIVE SARS-CoV-2 target nucleic acids are DETECTED. The  SARS-CoV-2 RNA is generally detectable in upper and lower  respiratory specimens during the acute phase of infection.  Positive  results are indicative of active infection with SARS-CoV-2.  Clinical  correlation with patient history and other diagnostic information is  necessary to determine patient infection status.  Positive results do  not rule out bacterial infection or co-infection with other viruses. If result is PRESUMPTIVE POSTIVE SARS-CoV-2 nucleic acids MAY BE PRESENT.   A presumptive positive result was obtained on the submitted specimen  and confirmed on repeat testing.  While 2019 novel coronavirus  (SARS-CoV-2) nucleic acids may be present in the submitted sample  additional confirmatory testing may be necessary for epidemiological  and / or clinical management purposes  to differentiate between  SARS-CoV-2 and other Sarbecovirus currently known to infect humans.  If clinically indicated additional testing with an alternate test   methodology [phone]) is a dvised. The SARS-CoV-2 RNA is generally  detectable in upper and lower respiratory specimens during the acute  phase of infection. The expected result is Negative. Fact Sheet for Patients:  StrictlyIdeas.no Fact Sheet for Healthcare Providers: BankingDealers.co.za This test is not yet approved or cleared by the [place] FDA and has been authorized for detection and/or diagnosis of SARS-CoV-2 by FDA under an Emergency Use Authorization (EUA).  This EUA will remain in effect (meaning this test can be used) for the duration of the COVID-19 declaration under Section 564(b)(1) of the Act, 21 U.S.C. section 360bbb-3(b)(1), unless the authorization is terminated or  revoked sooner. Performed at [facility], [address] [place], [place] [address]     Radiology Reports Dg Chest [place] 1 View  Result Date: 01/24/2019 CLINICAL DATA:  Difficulty breathing, concern for COVID-19 EXAM: PORTABLE CHEST 1 VIEW COMPARISON:  Radiograph 01/23/2019 FINDINGS: Worsening bilateral interstitial and airspace opacities throughout both lungs. Peripheral predominance most notable in the left lung base. Lung volumes are diminished with streaky areas of atelectasis as well. No pneumothorax or visible effusion. Cardiomediastinal contours are stable accounting for differences in technique and lung volumes. Degenerative changes are present in the imaged spine and shoulders. Cervical spinal fusion hardware is incompletely assessed. IMPRESSION: Worsening bilateral interstitial and airspace opacities throughout both lungs, compatible with a multifocal pneumonia such as atypical viral pneumonia including COVID-19. Electronically Signed   By: Lovena Le M.D.   On: 01/24/2019 22:17   Dg Chest Port 1v Same Day  Result Date: 01/26/2019 CLINICAL DATA:  Shortness of breath EXAM: PORTABLE CHEST 1 VIEW COMPARISON:  01/24/2019 FINDINGS: Heart is  borderline enlarged. There diffuse bilateral airspace opacities noted with some peripheral predominance. This is worsened since prior study. Low lung volumes. No visible effusions. IMPRESSION: Cardiomegaly. Patchy bilateral airspace opacities with peripheral predominance. This is worsened since prior study concerning for multifocal pneumonia. Electronically Signed   By: Rolm Baptise M.D.   On: 01/26/2019 09:14

## 2019-01-28 NOTE — Progress Notes (Signed)
Pt. Still sitting on the chair. Has been able to maintain O2 sats on high 80's. Pt. Is tired and feeling better. Will continuo to monitor closely

## 2019-01-28 NOTE — Progress Notes (Signed)
RT to assess pt requested by ICU charge RN. Pt states he is very tired and worn out. He is frustrated he can't get very high on his IS. This RT explained to him to just do his best and not to over exert himself and he will eventually get to the goal set for him. Pt closing eyes to take a nap. Spo2 94% on 15L HFNC, RT decreased to 12L spo2 remained at 92%. Pt denies SOB, no increased WOB. VS within normal limits. RT will continue to closely monitor pt

## 2019-01-28 NOTE — Progress Notes (Signed)
Pt. Still in prone position. Still anxious, O2 sats have improved. It does decreases with any exertion. Md is aware of it. ICU team at bedside evaluating the pt. Will continuo to monitor.

## 2019-01-29 LAB — CBC WITH DIFFERENTIAL/PLATELET
Abs Immature Granulocytes: 0.14 10*3/uL — ABNORMAL HIGH (ref 0.00–0.07)
Basophils Absolute: 0 10*3/uL (ref 0.0–0.1)
Basophils Relative: 0 %
Eosinophils Absolute: 0 10*3/uL (ref 0.0–0.5)
Eosinophils Relative: 0 %
HCT: 44.6 % (ref 39.0–52.0)
Hemoglobin: 14.7 g/dL (ref 13.0–17.0)
Immature Granulocytes: 1 %
Lymphocytes Relative: 5 %
Lymphs Abs: 0.6 10*3/uL — ABNORMAL LOW (ref 0.7–4.0)
MCH: 27.2 pg (ref 26.0–34.0)
MCHC: 33 g/dL (ref 30.0–36.0)
MCV: 82.4 fL (ref 80.0–100.0)
Monocytes Absolute: 0.8 10*3/uL (ref 0.1–1.0)
Monocytes Relative: 8 %
Neutro Abs: 8.6 10*3/uL — ABNORMAL HIGH (ref 1.7–7.7)
Neutrophils Relative %: 86 %
Platelets: 316 10*3/uL (ref 150–400)
RBC: 5.41 MIL/uL (ref 4.22–5.81)
RDW: 12.2 % (ref 11.5–15.5)
WBC: 10.1 10*3/uL (ref 4.0–10.5)
nRBC: 0 % (ref 0.0–0.2)

## 2019-01-29 LAB — GLUCOSE, CAPILLARY
Glucose-Capillary: 130 mg/dL — ABNORMAL HIGH (ref 70–99)
Glucose-Capillary: 241 mg/dL — ABNORMAL HIGH (ref 70–99)
Glucose-Capillary: 247 mg/dL — ABNORMAL HIGH (ref 70–99)
Glucose-Capillary: 212 mg/dL — ABNORMAL HIGH (ref 70–99)

## 2019-01-29 LAB — BRAIN NATRIURETIC PEPTIDE: B Natriuretic Peptide: 39.4 pg/mL (ref 0.0–100.0)

## 2019-01-29 LAB — CULTURE, BLOOD (ROUTINE X 2)
Culture: NO GROWTH
Special Requests: ADEQUATE

## 2019-01-29 LAB — COMPREHENSIVE METABOLIC PANEL
ALT: 129 U/L — ABNORMAL HIGH (ref 0–44)
AST: 147 U/L — ABNORMAL HIGH (ref 15–41)
Albumin: 3.2 g/dL — ABNORMAL LOW (ref 3.5–5.0)
Alkaline Phosphatase: 52 U/L (ref 38–126)
Anion gap: 10 (ref 5–15)
BUN: 31 mg/dL — ABNORMAL HIGH (ref 8–23)
CO2: 30 mmol/L (ref 22–32)
Calcium: 8.5 mg/dL — ABNORMAL LOW (ref 8.9–10.3)
Chloride: 102 mmol/L (ref 98–111)
Creatinine, Ser: 0.79 mg/dL (ref 0.61–1.24)
GFR calc Af Amer: >60 mL/min (ref >60)
GFR calc non Af Amer: >60 mL/min (ref >60)
Glucose, Bld: 118 mg/dL — ABNORMAL HIGH (ref 70–99)
Potassium: 4.7 mmol/L (ref 3.5–5.1)
Sodium: 142 mmol/L (ref 135–145)
Total Bilirubin: 0.8 mg/dL (ref 0.3–1.2)
Total Protein: 6.1 g/dL — ABNORMAL LOW (ref 6.5–8.1)

## 2019-01-29 LAB — D-DIMER, QUANTITATIVE: D-Dimer, Quant: 0.65 ug/mL-FEU — ABNORMAL HIGH (ref 0.00–0.50)

## 2019-01-29 LAB — C-REACTIVE PROTEIN: CRP: 1.4 mg/dL — ABNORMAL HIGH (ref <1.0)

## 2019-01-29 LAB — MAGNESIUM: Magnesium: 2.6 mg/dL — ABNORMAL HIGH (ref 1.7–2.4)

## 2019-01-29 MED ORDER — SALINE SPRAY 0.65 % NA SOLN
1.0000 | NASAL | Status: DC | PRN
  Administered 2019-01-29: 1 via NASAL
  Filled 2019-01-29: qty 44

## 2019-01-29 MED ORDER — CLONAZEPAM 0.5 MG PO TBDP
0.5000 mg | ORAL_TABLET | Freq: Two times a day (BID) | ORAL | Status: DC
  Administered 2019-01-29 – 2019-02-07 (×19): 0.5 mg via ORAL
  Filled 2019-01-29 (×19): qty 1

## 2019-01-29 NOTE — Progress Notes (Signed)
RT called to room for assessment as pt having more O2 needs. Pt actually appears less SOB today in theis RT's opinion but is defintely more anxious. Pt states he has been having nose bleeds and he can't breathe when that happens. RT encouraged pt to try and relax any way he can (medication, slow deep breathing, tv, etc) because the more he panics from the nose bleed the more O2 he needs which will in the long run make the nose bleeds worse. MD to order anxiety medication for pt. RT will continue to closely monitor pt

## 2019-01-29 NOTE — Progress Notes (Signed)
Pt proned from 2300-0600.

## 2019-01-29 NOTE — Progress Notes (Signed)
PROGRESS NOTE                                                                                                                                                                                                             Patient Demographics:    Nathan Perkins, is a 63 y.o. male, DOB - 1955/05/01, [id number]  Outpatient Primary MD for the patient is Monico Blitz, MD   Admit date - 01/24/2019   LOS - 4  Chief Complaint  Patient presents with  . Shortness of Breath       Brief Narrative: Patient is a 63 y.o. male with PMHx of insulin-dependent DM-2, HTN presented with 1 week history of cough, fever, myalgias and worsening shortness of breath.  Patient was found to have acute hypoxic respiratory failure in setting of COVID-19 pneumonia at [facility] course was complicated by rapidly worsening respiratory failure in spite of being treated with steroids, remdesivir and Actemra.  See below for further details.   Subjective:   Patient in bed, appears comfortable, denies any headache, no fever, no chest pain or pressure, no shortness of breath , no abdominal pain. No focal weakness. Does feel anxious.   Assessment  & Plan :   Acute Hypoxic Resp Failure due to Covid 19 Viral pneumonia: he has severe COVID-19 pneumonitis and acute hypoxic respiratory failure, still on 15 L high flow nasal cannula oxygen but in no distress however little anxious.  He has so far received full treatment including IV steroids, remdesivir, Actemra, convalescent plasma.  His inflammatory markers are finally trending down and he appears to have stabilized except for some ongoing anxiety.  He was encouraged to sit up in chair and daytime use flutter valve and I-S for many toiletry and prone when in bed.  I saw the patient 3 times on 01/29/19 - 7.45 am, 8.20 am and 9 am, he is now completely relaxed, eating breakfast, pulse ox is 94% with  a respiratory rate of 19 and a heart rate of 85.  He has a very mild nosebleed and saline spray for his nostrils has been ordered as needed.  He has been encouraged to sit up in chair again.  He is in no distress whatsoever.  In fact I think we can titrate his oxygen down to 12 L high flow today.  COVID-19 Labs: Recent Labs    01/27/19 0205 01/28/19 0020 01/29/19 0603  DDIMER 0.55* 0.61* 0.65*  CRP 6.7* 4.0* 1.4*    Lab Results  Component Value Date   SARSCOV2NAA POSITIVE (A) 01/24/2019    Hepatic Function Latest Ref Rng & Units 01/29/2019 01/28/2019 01/27/2019  Total Protein 6.5 - 8.1 g/dL 6.1(L) 6.6 6.5  Albumin 3.5 - 5.0 g/dL 3.2(L) 3.4(L) 3.1(L)  AST 15 - 41 U/L 147(H) 59(H) 37  ALT 0 - 44 U/L 129(H) 47(H) 27  Alk Phosphatase 38 - 126 U/L 52 52 45  Total Bilirubin 0.3 - 1.2 mg/dL 0.8 0.9 0.6    COVID-19 Medications:  Steroids: 10/20>> Remdesivir: 10/21>> Actemra:10/21 x 1 Convalescent Plasma: 10/22   Transaminitis.  Likely due to combination of COVID-19 infection and Remdesivir.  Finishing remdesivir soon, symptom-free will monitor trend.    HTN: BP relatively controlled-continue amlodipine and follow.  Extreme anxiety.  Added Klonopin twice daily will monitor.  Hold for sedation.  Insulin-dependent DM-2: Currently on Levemir, premeal NovoLog and SSI.  Monitor and adjust.    Lab Results  Component Value Date   HGBA1C 8.6 (H) 01/24/2019   CBG (last 3)  Recent Labs    01/28/19 1635 01/28/19 2017 01/29/19 0718  GLUCAP 153* 134* 130*     Condition -  Guarded  Family Communication  : Discussed with wife on 01/28/2019.  Code Status :  Full Code  Diet :  Diet Order            Diet Carb Modified Fluid consistency: Thin; Room service appropriate? Yes  Diet effective now               Disposition Plan  :  Remain hospitalized  Barriers to discharge: Hypoxemia  Consults  :  None  Procedures  :  None  Antibiotics  :    Anti-infectives (From  admission, onward)   Start     Dose/Rate Route Frequency Ordered Stop   01/26/19 1600  remdesivir 100 mg in sodium chloride 0.9 % 250 mL IVPB     100 mg 500 mL/hr over 30 Minutes Intravenous Every 24 hours 01/25/19 1445 01/30/19 1559   01/25/19 1600  remdesivir 200 mg in sodium chloride 0.9 % 250 mL IVPB     200 mg 500 mL/hr over 30 Minutes Intravenous Once 01/25/19 1445 01/25/19 1727      Inpatient Medications  Scheduled Meds: . albuterol  2 puff Inhalation Q6H  . amLODipine  5 mg Oral Daily  . aspirin  81 mg Oral Daily  . benzonatate  200 mg Oral TID  . clonazepam  0.5 mg Oral BID  . guaiFENesin  600 mg Oral BID  . insulin aspart  0-20 Units Subcutaneous TID WC  . insulin aspart  0-5 Units Subcutaneous QHS  . insulin aspart  18 Units Subcutaneous TID WC  . insulin detemir  48 Units Subcutaneous BID  . methylPREDNISolone (SOLU-MEDROL) injection  60 mg Intravenous Q12H  . pantoprazole  40 mg Oral Daily  . vitamin C  1,000 mg Oral Daily  . zinc sulfate  220 mg Oral Daily   Continuous Infusions: . sodium chloride Stopped (01/25/19 2211)  . remdesivir 100 mg in NS 250 mL 100 mg (01/28/19 1724)   PRN Meds:.albuterol, chlorpheniramine-HYDROcodone, labetalol, loperamide, [DISCONTINUED] ondansetron **OR** ondansetron (ZOFRAN) IV, sodium chloride, traMADol   Time Spent in minutes  35  See all Orders from today for further details   Lala Lund M.D on 01/29/2019  at 9:03 AM  To page go to [email or web address] - use universal password  [facility] -  Office  [phone]    Objective:   Vitals:   01/28/19 2000 01/28/19 2200 01/29/19 0611 01/29/19 0717  BP: (!) 141/90 133/88 (!) 144/93 125/80  Pulse: 88 76 86 88  Resp: '16 18 17 18  ' Temp: 98.1 F (36.7 C)  97.6 F (36.4 C) 97.7 F (36.5 C)  TempSrc: Oral  Oral Axillary  SpO2: 96% 94% 94% 96%  Weight:      Height:        Wt Readings from Last 3 Encounters:  01/25/19 90.1 kg  07/30/17 90.7 kg  01/07/15 92.5 kg      Intake/Output Summary (Last 24 hours) at 01/29/2019 0903 Last data filed at 01/28/2019 2253 Gross per 24 hour  Intake 840 ml  Output 325 ml  Net 515 ml     Physical Exam  Awake Alert, Oriented X 3, No new F.N deficits, anxious affect, in no respiratory distress whatsoever [place].AT,PERRAL Supple Neck,No JVD, No cervical lymphadenopathy appriciated.  Symmetrical Chest wall movement, Good air movement bilaterally, CTAB RRR,No Gallops, Rubs or new Murmurs, No Parasternal Heave +ve B.Sounds, Abd Soft, No tenderness, No organomegaly appriciated, No rebound - guarding or rigidity. No Cyanosis, Clubbing or edema, No new Rash or bruise     Data Review:    CBC Recent Labs  Lab 01/24/19 2136 01/25/19 0835 01/26/19 0555 01/27/19 0205 01/28/19 0020 01/29/19 0603  WBC 5.1 4.2 6.1 10.3 12.0* 10.1  HGB 14.5 14.2 14.0 14.0 15.1 14.7  HCT 44.2 44.5 42.7 42.6 46.2 44.6  PLT 199 210 237 286 318 316  MCV 82.5 84.6 82.6 81.5 82.4 82.4  MCH 27.1 27.0 27.1 26.8 26.9 27.2  MCHC 32.8 31.9 32.8 32.9 32.7 33.0  RDW 12.8 12.6 12.4 12.1 12.1 12.2  LYMPHSABS 0.9  --  0.5* 0.5* 0.6* 0.6*  MONOABS 0.4  --  0.3 0.5 0.7 0.8  EOSABS 0.0  --  0.0 0.0 0.0 0.0  BASOSABS 0.0  --  0.0 0.0 0.0 0.0    Chemistries  Recent Labs  Lab 01/25/19 0835 01/26/19 0555 01/27/19 0205 01/28/19 0020 01/29/19 0603  NA 138 138 142 143 142  K 4.8 5.2* 4.3 4.1 4.7  CL 100 104 104 101 102  CO2 '23 24 28 28 30  ' GLUCOSE 356* 345* 217* 112* 118*  BUN 27* 34* 36* 40* 31*  CREATININE 1.02 1.01 0.96 0.93 0.79  CALCIUM 8.7* 8.7* 8.7* 8.8* 8.5*  MG  --   --   --   --  2.6*  AST 44* 37 37 59* 147*  ALT '27 25 27 ' 47* 129*  ALKPHOS 41 42 45 52 52  BILITOT 0.9 0.5 0.6 0.9 0.8   ------------------------------------------------------------------------------------------------------------------ No results for input(s): CHOL, HDL, LDLCALC, TRIG, CHOLHDL, LDLDIRECT in the last 72 hours.  Lab Results  Component Value Date    HGBA1C 8.6 (H) 01/24/2019   ------------------------------------------------------------------------------------------------------------------ No results for input(s): TSH, T4TOTAL, T3FREE, THYROIDAB in the last 72 hours.  Invalid input(s): FREET3 ------------------------------------------------------------------------------------------------------------------ No results for input(s): VITAMINB12, FOLATE, FERRITIN, TIBC, IRON, RETICCTPCT in the last 72 hours.  Coagulation profile No results for input(s): INR, PROTIME in the last 168 hours.  Recent Labs    01/28/19 0020 01/29/19 0603  DDIMER 0.61* 0.65*    Cardiac Enzymes No results for input(s): CKMB, TROPONINI, MYOGLOBIN in the last 168 hours.  Invalid input(s): CK ------------------------------------------------------------------------------------------------------------------    Component Value Date/Time  BNP 39.4 01/29/2019 0603    Micro Results Recent Results (from the past 240 hour(s))  Blood Culture (routine x 2)     Status: None (Preliminary result)   Collection Time: 01/24/19  9:36 PM   Specimen: Left Antecubital; Blood  Result Value Ref Range Status   Specimen Description LEFT ANTECUBITAL  Final   Special Requests   Final    BOTTLES DRAWN AEROBIC AND ANAEROBIC Blood Culture adequate volume   Culture   Final    NO GROWTH 4 DAYS Performed at [facility], [address] [place], [place] [address]    Report Status PENDING  Incomplete  Blood Culture (routine x 2)     Status: None (Preliminary result)   Collection Time: 01/24/19 10:44 PM   Specimen: BLOOD LEFT HAND  Result Value Ref Range Status   Specimen Description BLOOD LEFT HAND  Final   Special Requests   Final    BOTTLES DRAWN AEROBIC AND ANAEROBIC Blood Culture results may not be optimal due to an excessive volume of blood received in culture bottles   Culture   Final    NO GROWTH 3 DAYS Performed at [facility], [address] [place], [place]  [address]    Report Status PENDING  Incomplete  SARS Coronavirus 2 by RT PCR (hospital order, performed in [place] hospital lab) Nasopharyngeal Nasopharyngeal Swab     Status: Abnormal   Collection Time: 01/24/19 10:45 PM   Specimen: Nasopharyngeal Swab  Result Value Ref Range Status   SARS Coronavirus 2 POSITIVE (A) NEGATIVE Final    Comment: RESULT CALLED TO, READ BACK BY AND VERIFIED WITH: L ANDREW,RN '@2345'  01/24/19 MKELLY (NOTE) If result is NEGATIVE SARS-CoV-2 target nucleic acids are NOT DETECTED. The SARS-CoV-2 RNA is generally detectable in upper and lower  respiratory specimens during the acute phase of infection. The lowest  concentration of SARS-CoV-2 viral copies this assay can detect is 250  copies / mL. A negative result does not preclude SARS-CoV-2 infection  and should not be used as the sole basis for treatment or other  patient management decisions.  A negative result may occur with  improper specimen collection / handling, submission of specimen other  than nasopharyngeal swab, presence of viral mutation(s) within the  areas targeted by this assay, and inadequate number of viral copies  (<250 copies / mL). A negative result must be combined with clinical  observations, patient history, and epidemiological information. If result is POSITIVE SARS-CoV-2 target nucleic acids are DETECTED. The  SARS-CoV-2 RNA is generally detectable in upper and lower  respiratory specimens during the acute phase of infection.  Positive  results are indicative of active infection with SARS-CoV-2.  Clinical  correlation with patient history and other diagnostic information is  necessary to determine patient infection status.  Positive results do  not rule out bacterial infection or co-infection with other viruses. If result is PRESUMPTIVE POSTIVE SARS-CoV-2 nucleic acids MAY BE PRESENT.   A presumptive positive result was obtained on the submitted specimen  and confirmed on repeat  testing.  While 2019 novel coronavirus  (SARS-CoV-2) nucleic acids may be present in the submitted sample  additional confirmatory testing may be necessary for epidemiological  and / or clinical management purposes  to differentiate between  SARS-CoV-2 and other Sarbecovirus currently known to infect humans.  If clinically indicated additional testing with an alternate test  methodology [id number]) is a dvised. The SARS-CoV-2 RNA is generally  detectable in upper and lower respiratory specimens  during the acute  phase of infection. The expected result is Negative. Fact Sheet for Patients:  StrictlyIdeas.no Fact Sheet for Healthcare Providers: BankingDealers.co.za This test is not yet approved or cleared by the [place] FDA and has been authorized for detection and/or diagnosis of SARS-CoV-2 by FDA under an Emergency Use Authorization (EUA).  This EUA will remain in effect (meaning this test can be used) for the duration of the COVID-19 declaration under Section 564(b)(1) of the Act, 21 U.S.C. section 360bbb-3(b)(1), unless the authorization is terminated or revoked sooner. Performed at [facility], [address], [place], [place] [address]     Radiology Reports Dg Chest [place] 1 View  Result Date: 01/24/2019 CLINICAL DATA:  Difficulty breathing, concern for COVID-19 EXAM: PORTABLE CHEST 1 VIEW COMPARISON:  Radiograph 01/23/2019 FINDINGS: Worsening bilateral interstitial and airspace opacities throughout both lungs. Peripheral predominance most notable in the left lung base. Lung volumes are diminished with streaky areas of atelectasis as well. No pneumothorax or visible effusion. Cardiomediastinal contours are stable accounting for differences in technique and lung volumes. Degenerative changes are present in the imaged spine and shoulders. Cervical spinal fusion hardware is incompletely assessed. IMPRESSION: Worsening bilateral  interstitial and airspace opacities throughout both lungs, compatible with a multifocal pneumonia such as atypical viral pneumonia including COVID-19. Electronically Signed   By: Lovena Le M.D.   On: 01/24/2019 22:17   Dg Chest Port 1v Same Day  Result Date: 01/26/2019 CLINICAL DATA:  Shortness of breath EXAM: PORTABLE CHEST 1 VIEW COMPARISON:  01/24/2019 FINDINGS: Heart is borderline enlarged. There diffuse bilateral airspace opacities noted with some peripheral predominance. This is worsened since prior study. Low lung volumes. No visible effusions. IMPRESSION: Cardiomegaly. Patchy bilateral airspace opacities with peripheral predominance. This is worsened since prior study concerning for multifocal pneumonia. Electronically Signed   By: Rolm Baptise M.D.   On: 01/26/2019 09:14

## 2019-01-29 NOTE — Progress Notes (Signed)
This Probation officer called pt's wife Lydon Wenzinger for POC update and questions and concerns answered at this time.

## 2019-01-29 NOTE — Evaluation (Signed)
Occupational Therapy Evaluation Patient Details Name: Nathan Perkins MRN: [id number] DOB: 11/22/1955 Today's Date: 01/29/2019    History of Present Illness 63 y.o. male, with history of diabetes mellitus type 2, hypertension came to hospital with worsening shortness of breath.  O2 sats dropping to 80% on room air   Clinical Impression   PTA, pt was living with his wife and was independent and working as a Conservation officer, historic buildings. Pt currently performing ADLs and functional mobility at supervision level. Pt presenting with significant fatigue, poor activity tolerance, and decreased strength. Pt highly motivated to participate despite fatigue. SpO2 89-86% on 15L via HFNC with activity; cueing pt for purse lip breathing. Pt would benefit from further acute OT to facilitate safe dc. Recommend dc to home once medically stable per physician.    Follow Up Recommendations  No OT follow up;Supervision/Assistance - 24 hour    Equipment Recommendations  3 in 1 bedside commode    Recommendations for Other Services PT consult     Precautions / Restrictions Precautions Precautions: Fall Restrictions Weight Bearing Restrictions: No      Mobility Bed Mobility Overal bed mobility: Needs Assistance Bed Mobility: Supine to Sit Rolling: Supervision         General bed mobility comments: Supervision for safety. No physical A needed  Transfers Overall transfer level: Needs assistance Equipment used: None Transfers: Sit to/from Stand Sit to Stand: Supervision         General transfer comment: Supervision for safety and increased time to stabilize himself in standing    Balance Overall balance assessment: Needs assistance Sitting-balance support: No upper extremity supported;Feet supported Sitting balance-Leahy Scale: Good     Standing balance support: During functional activity Standing balance-Leahy Scale: Good                             ADL either performed or assessed  with clinical judgement   ADL Overall ADL's : Needs assistance/impaired                                       General ADL Comments: Pt performing ADLs at Supervision level. Presenting with significant fatigue and decreased activity tolerance. Discussed use of 3N1 as shower seat. Pt performing three laps around room. Highly motivated despite fatigue     Vision         Perception     Praxis      Pertinent Vitals/Pain Pain Assessment: No/denies pain     Hand Dominance     Extremity/Trunk Assessment Upper Extremity Assessment Upper Extremity Assessment: Overall WFL for tasks assessed   Lower Extremity Assessment Lower Extremity Assessment: Generalized weakness   Cervical / Trunk Assessment Cervical / Trunk Assessment: Normal   Communication Communication Communication: No difficulties   Cognition Arousal/Alertness: Awake/alert Behavior During Therapy: WFL for tasks assessed/performed Overall Cognitive Status: Within Functional Limits for tasks assessed                                 General Comments: Very motivated to particiapte in therapy   General Comments  SpO2 89-86% on 15L via HFNC. HR 108-115. RR 17-23. Cued for purse lip breathing    Exercises Exercises: Other exercises Other Exercises Other Exercises: Pt using incentive spirometer x5   Shoulder Instructions  Home Living Family/patient expects to be discharged to:: Private residence Living Arrangements: Spouse/significant other Available Help at Discharge: Family Type of Home: House Home Access: Level entry     [place]: One level     Bathroom Shower/Tub: Occupational psychologist: Standard Bathroom Accessibility: Yes   Home Equipment: [place] - single point          Prior Functioning/Environment Level of Independence: Independent        Comments: works as a Holiday representative Problem List: Decreased strength;Decreased range of  motion;Decreased activity tolerance;Impaired balance (sitting and/or standing);Decreased knowledge of use of DME or AE;Decreased knowledge of precautions;Cardiopulmonary status limiting activity      OT Treatment/Interventions: Self-care/ADL training;Therapeutic exercise;Energy conservation;DME and/or AE instruction;Therapeutic activities;Patient/family education    OT Goals(Current goals can be found in the care plan section) Acute Rehab OT Goals Patient Stated Goal: get better and get out of hospital OT Goal Formulation: With patient Time For Goal Achievement: 02/12/19 Potential to Achieve Goals: Good  OT Frequency: Min 3X/week   Barriers to D/C:            Co-evaluation              AM-PAC OT "6 Clicks" Daily Activity     Outcome Measure Help from another person eating meals?: None Help from another person taking care of personal grooming?: None Help from another person toileting, which includes using toliet, bedpan, or urinal?: None Help from another person bathing (including washing, rinsing, drying)?: None Help from another person to put on and taking off regular upper body clothing?: None Help from another person to put on and taking off regular lower body clothing?: None 6 Click Score: 24   End of Session Equipment Utilized During Treatment: Oxygen(15L via HFNC) Nurse Communication: Mobility status  Activity Tolerance: Patient tolerated treatment well Patient left: in chair;with call bell/phone within reach  OT Visit Diagnosis: Unsteadiness on feet (R26.81);Other abnormalities of gait and mobility (R26.89);Muscle weakness (generalized) (M62.81)                Time: [id number] OT Time Calculation (min): 21 min Charges:  OT General Charges $OT Visit: 1 Visit OT Evaluation $OT Eval Moderate Complexity: Gonzalez, OTR/L Acute Rehab Pager: [phone] Office: [place] 01/29/2019, 4:53 PM

## 2019-01-30 LAB — GLUCOSE, CAPILLARY
Glucose-Capillary: 71 mg/dL (ref 70–99)
Glucose-Capillary: 209 mg/dL — ABNORMAL HIGH (ref 70–99)
Glucose-Capillary: 170 mg/dL — ABNORMAL HIGH (ref 70–99)
Glucose-Capillary: 193 mg/dL — ABNORMAL HIGH (ref 70–99)

## 2019-01-30 LAB — CBC WITH DIFFERENTIAL/PLATELET
Abs Immature Granulocytes: 0.15 10*3/uL — ABNORMAL HIGH (ref 0.00–0.07)
Basophils Absolute: 0 10*3/uL (ref 0.0–0.1)
Basophils Relative: 0 %
Eosinophils Absolute: 0 10*3/uL (ref 0.0–0.5)
Eosinophils Relative: 0 %
HCT: 32.9 % — ABNORMAL LOW (ref 39.0–52.0)
Hemoglobin: 11.1 g/dL — ABNORMAL LOW (ref 13.0–17.0)
Immature Granulocytes: 1 %
Lymphocytes Relative: 6 %
Lymphs Abs: 0.6 10*3/uL — ABNORMAL LOW (ref 0.7–4.0)
MCH: 27.8 pg (ref 26.0–34.0)
MCHC: 33.7 g/dL (ref 30.0–36.0)
MCV: 82.3 fL (ref 80.0–100.0)
Monocytes Absolute: 1 10*3/uL (ref 0.1–1.0)
Monocytes Relative: 9 %
Neutro Abs: 8.6 10*3/uL — ABNORMAL HIGH (ref 1.7–7.7)
Neutrophils Relative %: 84 %
Platelets: 243 10*3/uL (ref 150–400)
RBC: 4 MIL/uL — ABNORMAL LOW (ref 4.22–5.81)
RDW: 11.9 % (ref 11.5–15.5)
WBC: 10.4 10*3/uL (ref 4.0–10.5)
nRBC: 0 % (ref 0.0–0.2)

## 2019-01-30 LAB — CULTURE, BLOOD (ROUTINE X 2): Culture: NO GROWTH

## 2019-01-30 LAB — COMPREHENSIVE METABOLIC PANEL
ALT: 94 U/L — ABNORMAL HIGH (ref 0–44)
AST: 40 U/L (ref 15–41)
Albumin: 3 g/dL — ABNORMAL LOW (ref 3.5–5.0)
Alkaline Phosphatase: 46 U/L (ref 38–126)
Anion gap: 9 (ref 5–15)
BUN: 29 mg/dL — ABNORMAL HIGH (ref 8–23)
CO2: 30 mmol/L (ref 22–32)
Calcium: 8.6 mg/dL — ABNORMAL LOW (ref 8.9–10.3)
Chloride: 101 mmol/L (ref 98–111)
Creatinine, Ser: 0.81 mg/dL (ref 0.61–1.24)
GFR calc Af Amer: >60 mL/min (ref >60)
GFR calc non Af Amer: >60 mL/min (ref >60)
Glucose, Bld: 107 mg/dL — ABNORMAL HIGH (ref 70–99)
Potassium: 4.7 mmol/L (ref 3.5–5.1)
Sodium: 140 mmol/L (ref 135–145)
Total Bilirubin: 0.4 mg/dL (ref 0.3–1.2)
Total Protein: 5.8 g/dL — ABNORMAL LOW (ref 6.5–8.1)

## 2019-01-30 LAB — BRAIN NATRIURETIC PEPTIDE: B Natriuretic Peptide: 41.3 pg/mL (ref 0.0–100.0)

## 2019-01-30 LAB — MAGNESIUM: Magnesium: 2.4 mg/dL (ref 1.7–2.4)

## 2019-01-30 LAB — D-DIMER, QUANTITATIVE: D-Dimer, Quant: 1.05 ug/mL-FEU — ABNORMAL HIGH (ref 0.00–0.50)

## 2019-01-30 LAB — C-REACTIVE PROTEIN: CRP: 0.8 mg/dL (ref <1.0)

## 2019-01-30 MED ORDER — METHYLPREDNISOLONE SODIUM SUCC 40 MG IJ SOLR
40.0000 mg | Freq: Two times a day (BID) | INTRAMUSCULAR | Status: DC
  Administered 2019-01-30 – 2019-01-31 (×3): 40 mg via INTRAVENOUS
  Filled 2019-01-30 (×4): qty 1

## 2019-01-30 MED ORDER — INSULIN DETEMIR 100 UNIT/ML ~~LOC~~ SOLN
45.0000 [IU] | Freq: Two times a day (BID) | SUBCUTANEOUS | Status: DC
  Administered 2019-01-30 – 2019-01-31 (×4): 45 [IU] via SUBCUTANEOUS
  Filled 2019-01-30 (×5): qty 0.45

## 2019-01-30 MED ORDER — INSULIN ASPART 100 UNIT/ML ~~LOC~~ SOLN
14.0000 [IU] | Freq: Three times a day (TID) | SUBCUTANEOUS | Status: DC
  Administered 2019-01-30 – 2019-01-31 (×5): 14 [IU] via SUBCUTANEOUS

## 2019-01-30 NOTE — Progress Notes (Signed)
Pt updated his wife.

## 2019-01-30 NOTE — Progress Notes (Signed)
Physical Therapy Treatment Patient Details Name: Nathan Perkins MRN: [id number] DOB: July 02, 1955 Today's Date: 01/30/2019    History of Present Illness is a 63 y.o. male with PMHx of insulin-dependent DM-2, HTN presented  to ER with 1 week history of cough, fever, myalgias and worsening shortness of breath.  Patient was found to have acute hypoxic respiratory failure in setting of COVID-19 pneumonia .Admitted 01/24/19    PT Comments    The patient is very lethargic, did ambulate x 80' on 10 L HFNC.with HHA.  Continue PT , monitor on decreased supplemental O2.    Follow Up Recommendations  No PT follow up     Equipment Recommendations       Recommendations for Other Services       Precautions / Restrictions Precautions Precautions: Fall Precaution Comments: on HFNC, trying to wean down- POX on finger, try ear.    Mobility  Bed Mobility               General bed mobility comments: pt in recliner  Transfers Overall transfer level: Needs assistance Equipment used: None Transfers: Sit to/from Stand Sit to Stand: Supervision         General transfer comment: Supervision for safety and increased time to stabilize himself in standing  Ambulation/Gait Ambulation/Gait assistance: Min guard;Min assist Gait Distance (Feet): 80 Feet Assistive device: 1 person hand held assist Gait Pattern/deviations: Step-to pattern;Decreased step length - right;Drifts right/left Gait velocity: very slow   General Gait Details: patient ambulated with eyes partially closed. Encouraged to open eyes. patient placed on 10 L HFNC, SPO2 down to 85%, back to 90% within 1 minute resting.   Stairs             Wheelchair Mobility    Modified Rankin (Stroke Patients Only)       Balance   Sitting-balance support: No upper extremity supported;Feet supported Sitting balance-Leahy Scale: Good     Standing balance support: During functional activity Standing balance-Leahy  Scale: Fair Standing balance comment: very sleepy                            Cognition                                       General Comments: much stimulation to arouse, ambulated with eyes partially closed, patient states that he is very tired.      Exercises General Exercises - Upper Extremity Shoulder Flexion: AROM;10 reps;Theraband Theraband Level (Shoulder Flexion): Level 2 (Red) Shoulder Horizontal ABduction: AROM;10 reps;Theraband Theraband Level (Shoulder Horizontal Abduction): Level 2 (Red)    General Comments        Pertinent Vitals/Pain Pain Assessment: No/denies pain    Home Living                      Prior Function            PT Goals (current goals can now be found in the care plan section)      Frequency           PT Plan Current plan remains appropriate    Co-evaluation              AM-PAC PT "6 Clicks" Mobility   Outcome Measure  Help needed turning from your back to your side while in a flat bed  without using bedrails?: A Little Help needed moving from lying on your back to sitting on the side of a flat bed without using bedrails?: A Little Help needed moving to and from a bed to a chair (including a wheelchair)?: A Little Help needed standing up from a chair using your arms (e.g., wheelchair or bedside chair)?: A Little Help needed to walk in hospital room?: A Little Help needed climbing 3-5 steps with a railing? : A Lot 6 Click Score: 17    End of Session Equipment Utilized During Treatment: Oxygen Activity Tolerance: Patient tolerated treatment well Patient left: in chair;with call bell/phone within reach Nurse Communication: Mobility status PT Visit Diagnosis: Other abnormalities of gait and mobility (R26.89);Muscle weakness (generalized) (M62.81)     Time: [id number] PT Time Calculation (min) (ACUTE ONLY): 23 min  Charges:  $Gait Training: 23-37 mins                     [place]  Office [phone]    Claretha Cooper 01/30/2019, 5:14 PM

## 2019-01-30 NOTE — Progress Notes (Signed)
PROGRESS NOTE                                                                                                                                                                                                             Patient Demographics:    Nathan Perkins, is a 63 y.o. male, DOB - [birth year], EML:[id number]  Outpatient Primary MD for the patient is Monico Blitz, MD   Admit date - 01/24/2019   LOS - 5  Chief Complaint  Patient presents with  . Shortness of Breath       Brief Narrative: Patient is a 63 y.o. male with PMHx of insulin-dependent DM-2, HTN presented with 1 week history of cough, fever, myalgias and worsening shortness of breath.  Patient was found to have acute hypoxic respiratory failure in setting of COVID-19 pneumonia at [facility] course was complicated by rapidly worsening respiratory failure in spite of being treated with steroids, remdesivir and Actemra.  See below for further details.   Subjective:   Patient in bed and then in chair, appears comfortable, denies any headache, no fever, no chest pain or pressure, no shortness of breath , no abdominal pain. No focal weakness.   Assessment  & Plan :   Acute Hypoxic Resp Failure due to Covid 19 Viral pneumonia: he has severe COVID-19 pneumonitis and acute hypoxic respiratory failure, still on 12 L high flow nasal cannula oxygen but in no distress however little anxious.  He has so far received full treatment including IV steroids, remdesivir, Actemra, convalescent plasma.  His inflammatory markers are finally trending down and he appears to have stabilized except for some ongoing anxiety.  He was encouraged to sit up in chair and daytime use flutter valve and I-S for many toiletry and prone when in bed.  He is now definitely showing clinical improvement albeit very gradually, he is now down to 12 L high flow nasal cannula oxygen from 15,  appears to be in no distress, continue present line of care he has finished his IV remdesivir course.  Will taper steroids very gradually on this patient.  Still has the potential to deteriorate but overall much improved than what he was at the time of admission.    COVID-19 Labs: Recent Labs    01/28/19 0020 01/29/19 0603 01/30/19 0525  DDIMER 0.61* 0.65* 1.05*  CRP 4.0* 1.4* 0.8    Lab Results  Component Value Date   SARSCOV2NAA POSITIVE (A) 01/24/2019    Hepatic Function Latest Ref Rng & Units 01/30/2019 01/29/2019 01/28/2019  Total Protein 6.5 - 8.1 g/dL 5.8(L) 6.1(L) 6.6  Albumin 3.5 - 5.0 g/dL 3.0(L) 3.2(L) 3.4(L)  AST 15 - 41 U/L 40 147(H) 59(H)  ALT 0 - 44 U/L 94(H) 129(H) 47(H)  Alk Phosphatase 38 - 126 U/L 46 52 52  Total Bilirubin 0.3 - 1.2 mg/dL 0.4 0.8 0.9    COVID-19 Medications:  Steroids: 10/20>> Remdesivir: 10/21>>10/26 Actemra:10/21 x 1 Convalescent Plasma: 10/22   Transaminitis.  Likely due to combination of COVID-19 infection and Remdesivir.  Finishing remdesivir soon, symptom-free will monitor trend.    HTN: BP relatively controlled-continue amlodipine and follow.  Extreme anxiety.  Added Klonopin twice daily will monitor.  Hold for sedation.  Insulin-dependent DM-2: Currently on Levemir, premeal NovoLog and SSI.  Lantus and premeal adjusted on 01/30/2019.  Lab Results  Component Value Date   HGBA1C 8.6 (H) 01/24/2019   CBG (last 3)  Recent Labs    01/29/19 1601 01/29/19 2135 01/30/19 0816  GLUCAP 247* 212* 71     Condition -  Guarded  Family Communication  : Discussed with wife on 01/28/2019.  Code Status :  Full Code  Diet :  Diet Order            Diet Carb Modified Fluid consistency: Thin; Room service appropriate? Yes  Diet effective now               Disposition Plan  :  Remain hospitalized  Barriers to discharge: Hypoxemia  Consults  :  None  Procedures  :  None  Antibiotics  :    Anti-infectives (From  admission, onward)   Start     Dose/Rate Route Frequency Ordered Stop   01/26/19 1600  remdesivir 100 mg in sodium chloride 0.9 % 250 mL IVPB     100 mg 500 mL/hr over 30 Minutes Intravenous Every 24 hours 01/25/19 1445 01/29/19 1720   01/25/19 1600  remdesivir 200 mg in sodium chloride 0.9 % 250 mL IVPB     200 mg 500 mL/hr over 30 Minutes Intravenous Once 01/25/19 1445 01/25/19 1727      Inpatient Medications  Scheduled Meds: . albuterol  2 puff Inhalation Q6H  . amLODipine  5 mg Oral Daily  . aspirin  81 mg Oral Daily  . benzonatate  200 mg Oral TID  . clonazepam  0.5 mg Oral BID  . guaiFENesin  600 mg Oral BID  . insulin aspart  0-20 Units Subcutaneous TID WC  . insulin aspart  0-5 Units Subcutaneous QHS  . insulin aspart  18 Units Subcutaneous TID WC  . insulin detemir  48 Units Subcutaneous BID  . methylPREDNISolone (SOLU-MEDROL) injection  60 mg Intravenous Q12H  . pantoprazole  40 mg Oral Daily  . vitamin C  1,000 mg Oral Daily  . zinc sulfate  220 mg Oral Daily   Continuous Infusions: . sodium chloride Stopped (01/25/19 2211)   PRN Meds:.albuterol, chlorpheniramine-HYDROcodone, labetalol, loperamide, [DISCONTINUED] ondansetron **OR** ondansetron (ZOFRAN) IV, sodium chloride, traMADol   Time Spent in minutes  35  See all Orders from today for further details   Lala Lund M.D on 01/30/2019 at 9:04 AM  To page go to [email or web address] - use universal password  [facility] -  Office  [phone]    Objective:   Vitals:   01/29/19  2200 01/30/19 0000 01/30/19 0400 01/30/19 0813  BP:  126/73 139/81 140/78  Pulse: 86 72 67 85  Resp: '16 16 15 20  ' Temp:  98.7 F (37.1 C) 98.7 F (37.1 C) 98.4 F (36.9 C)  TempSrc:  Oral Oral Oral  SpO2: 96% 95% 91% 91%  Weight:      Height:        Wt Readings from Last 3 Encounters:  01/25/19 90.1 kg  07/30/17 90.7 kg  01/07/15 92.5 kg     Intake/Output Summary (Last 24 hours) at 01/30/2019 0904 Last data  filed at 01/30/2019 0600 Gross per 24 hour  Intake 1080 ml  Output 2050 ml  Net -970 ml     Physical Exam  Awake Alert, Oriented X 3, No new F.N deficits, Normal affect .AT,PERRAL Supple Neck,No JVD, No cervical lymphadenopathy appriciated.  Symmetrical Chest wall movement, Good air movement bilaterally, CTAB RRR,No Gallops, Rubs or new Murmurs, No Parasternal Heave +ve B.Sounds, Abd Soft, No tenderness, No organomegaly appriciated, No rebound - guarding or rigidity. No Cyanosis, Clubbing or edema, No new Rash or bruise    Data Review:    CBC Recent Labs  Lab 01/26/19 0555 01/27/19 0205 01/28/19 0020 01/29/19 0603 01/30/19 0525  WBC 6.1 10.3 12.0* 10.1 10.4  HGB 14.0 14.0 15.1 14.7 11.1*  HCT 42.7 42.6 46.2 44.6 32.9*  PLT 237 286 318 316 243  MCV 82.6 81.5 82.4 82.4 82.3  MCH 27.1 26.8 26.9 27.2 27.8  MCHC 32.8 32.9 32.7 33.0 33.7  RDW 12.4 12.1 12.1 12.2 11.9  LYMPHSABS 0.5* 0.5* 0.6* 0.6* 0.6*  MONOABS 0.3 0.5 0.7 0.8 1.0  EOSABS 0.0 0.0 0.0 0.0 0.0  BASOSABS 0.0 0.0 0.0 0.0 0.0    Chemistries  Recent Labs  Lab 01/26/19 0555 01/27/19 0205 01/28/19 0020 01/29/19 0603 01/30/19 0525  NA 138 142 143 142 140  K 5.2* 4.3 4.1 4.7 4.7  CL 104 104 101 102 101  CO2 '24 28 28 30 30  ' GLUCOSE 345* 217* 112* 118* 107*  BUN 34* 36* 40* 31* 29*  CREATININE 1.01 0.96 0.93 0.79 0.81  CALCIUM 8.7* 8.7* 8.8* 8.5* 8.6*  MG  --   --   --  2.6* 2.4  AST 37 37 59* 147* 40  ALT 25 27 47* 129* 94*  ALKPHOS 42 45 52 52 46  BILITOT 0.5 0.6 0.9 0.8 0.4   ------------------------------------------------------------------------------------------------------------------ No results for input(s): CHOL, HDL, LDLCALC, TRIG, CHOLHDL, LDLDIRECT in the last 72 hours.  Lab Results  Component Value Date   HGBA1C 8.6 (H) 01/24/2019   ------------------------------------------------------------------------------------------------------------------ No results for input(s): TSH,  T4TOTAL, T3FREE, THYROIDAB in the last 72 hours.  Invalid input(s): FREET3 ------------------------------------------------------------------------------------------------------------------ No results for input(s): VITAMINB12, FOLATE, FERRITIN, TIBC, IRON, RETICCTPCT in the last 72 hours.  Coagulation profile No results for input(s): INR, PROTIME in the last 168 hours.  Recent Labs    01/29/19 0603 01/30/19 0525  DDIMER 0.65* 1.05*    Cardiac Enzymes No results for input(s): CKMB, TROPONINI, MYOGLOBIN in the last 168 hours.  Invalid input(s): CK ------------------------------------------------------------------------------------------------------------------    Component Value Date/Time   BNP 41.3 01/30/2019 0525    Micro Results Recent Results (from the past 240 hour(s))  Blood Culture (routine x 2)     Status: None   Collection Time: 01/24/19  9:36 PM   Specimen: Left Antecubital; Blood  Result Value Ref Range Status   Specimen Description LEFT ANTECUBITAL  Final   Special Requests  Final    BOTTLES DRAWN AEROBIC AND ANAEROBIC Blood Culture adequate volume   Culture   Final    NO GROWTH 5 DAYS Performed at [facility], [address], [place], [place] [address]    Report Status 01/29/2019 FINAL  Final  Blood Culture (routine x 2)     Status: None   Collection Time: 01/24/19 10:44 PM   Specimen: BLOOD LEFT HAND  Result Value Ref Range Status   Specimen Description BLOOD LEFT HAND  Final   Special Requests   Final    BOTTLES DRAWN AEROBIC AND ANAEROBIC Blood Culture results may not be optimal due to an excessive volume of blood received in culture bottles   Culture   Final    NO GROWTH 5 DAYS Performed at [facility], [address], [place], [place] [address]    Report Status 01/30/2019 FINAL  Final  SARS Coronavirus 2 by RT PCR (hospital order, performed in [place] hospital lab) Nasopharyngeal Nasopharyngeal Swab     Status: Abnormal   Collection Time:  01/24/19 10:45 PM   Specimen: Nasopharyngeal Swab  Result Value Ref Range Status   SARS Coronavirus 2 POSITIVE (A) NEGATIVE Final    Comment: RESULT CALLED TO, READ BACK BY AND VERIFIED WITH: L ANDREW,RN '@[id number]'  01/24/19 MKELLY (NOTE) If result is NEGATIVE SARS-CoV-2 target nucleic acids are NOT DETECTED. The SARS-CoV-2 RNA is generally detectable in upper and lower  respiratory specimens during the acute phase of infection. The lowest  concentration of SARS-CoV-2 viral copies this assay can detect is 250  copies / mL. A negative result does not preclude SARS-CoV-2 infection  and should not be used as the sole basis for treatment or other  patient management decisions.  A negative result may occur with  improper specimen collection / handling, submission of specimen other  than nasopharyngeal swab, presence of viral mutation(s) within the  areas targeted by this assay, and inadequate number of viral copies  (<250 copies / mL). A negative result must be combined with clinical  observations, patient history, and epidemiological information. If result is POSITIVE SARS-CoV-2 target nucleic acids are DETECTED. The  SARS-CoV-2 RNA is generally detectable in upper and lower  respiratory specimens during the acute phase of infection.  Positive  results are indicative of active infection with SARS-CoV-2.  Clinical  correlation with patient history and other diagnostic information is  necessary to determine patient infection status.  Positive results do  not rule out bacterial infection or co-infection with other viruses. If result is PRESUMPTIVE POSTIVE SARS-CoV-2 nucleic acids MAY BE PRESENT.   A presumptive positive result was obtained on the submitted specimen  and confirmed on repeat testing.  While 2019 novel coronavirus  (SARS-CoV-2) nucleic acids may be present in the submitted sample  additional confirmatory testing may be necessary for epidemiological  and / or clinical management  purposes  to differentiate between  SARS-CoV-2 and other Sarbecovirus currently known to infect humans.  If clinically indicated additional testing with an alternate test  methodology [phone]) is a dvised. The SARS-CoV-2 RNA is generally  detectable in upper and lower respiratory specimens during the acute  phase of infection. The expected result is Negative. Fact Sheet for Patients:  StrictlyIdeas.no Fact Sheet for Healthcare Providers: BankingDealers.co.za This test is not yet approved or cleared by the [place] FDA and has been authorized for detection and/or diagnosis of SARS-CoV-2 by FDA under an Emergency Use Authorization (EUA).  This EUA will remain in effect (meaning this  test can be used) for the duration of the COVID-19 declaration under Section 564(b)(1) of the Act, 21 U.S.C. section 360bbb-3(b)(1), unless the authorization is terminated or revoked sooner. Performed at [facility], [address] [place], [place] [address]     Radiology Reports Dg Chest Watts 1 View  Result Date: 01/24/2019 CLINICAL DATA:  Difficulty breathing, concern for COVID-19 EXAM: PORTABLE CHEST 1 VIEW COMPARISON:  Radiograph 01/23/2019 FINDINGS: Worsening bilateral interstitial and airspace opacities throughout both lungs. Peripheral predominance most notable in the left lung base. Lung volumes are diminished with streaky areas of atelectasis as well. No pneumothorax or visible effusion. Cardiomediastinal contours are stable accounting for differences in technique and lung volumes. Degenerative changes are present in the imaged spine and shoulders. Cervical spinal fusion hardware is incompletely assessed. IMPRESSION: Worsening bilateral interstitial and airspace opacities throughout both lungs, compatible with a multifocal pneumonia such as atypical viral pneumonia including COVID-19. Electronically Signed   By: Lovena Le M.D.   On: 01/24/2019 22:17    Dg Chest Port 1v Same Day  Result Date: 01/26/2019 CLINICAL DATA:  Shortness of breath EXAM: PORTABLE CHEST 1 VIEW COMPARISON:  01/24/2019 FINDINGS: Heart is borderline enlarged. There diffuse bilateral airspace opacities noted with some peripheral predominance. This is worsened since prior study. Low lung volumes. No visible effusions. IMPRESSION: Cardiomegaly. Patchy bilateral airspace opacities with peripheral predominance. This is worsened since prior study concerning for multifocal pneumonia. Electronically Signed   By: Rolm Baptise M.D.   On: 01/26/2019 09:14

## 2019-01-31 LAB — COMPREHENSIVE METABOLIC PANEL
ALT: 81 U/L — ABNORMAL HIGH (ref 0–44)
AST: 31 U/L (ref 15–41)
Albumin: 3.3 g/dL — ABNORMAL LOW (ref 3.5–5.0)
Alkaline Phosphatase: 46 U/L (ref 38–126)
Anion gap: 7 (ref 5–15)
BUN: 30 mg/dL — ABNORMAL HIGH (ref 8–23)
CO2: 30 mmol/L (ref 22–32)
Calcium: 8.4 mg/dL — ABNORMAL LOW (ref 8.9–10.3)
Chloride: 99 mmol/L (ref 98–111)
Creatinine, Ser: 0.9 mg/dL (ref 0.61–1.24)
GFR calc Af Amer: >60 mL/min (ref >60)
GFR calc non Af Amer: >60 mL/min (ref >60)
Glucose, Bld: 159 mg/dL — ABNORMAL HIGH (ref 70–99)
Potassium: 4.8 mmol/L (ref 3.5–5.1)
Sodium: 136 mmol/L (ref 135–145)
Total Bilirubin: 0.8 mg/dL (ref 0.3–1.2)
Total Protein: 6.1 g/dL — ABNORMAL LOW (ref 6.5–8.1)

## 2019-01-31 LAB — CBC WITH DIFFERENTIAL/PLATELET
Abs Immature Granulocytes: 0.18 10*3/uL — ABNORMAL HIGH (ref 0.00–0.07)
Basophils Absolute: 0 10*3/uL (ref 0.0–0.1)
Basophils Relative: 0 %
Eosinophils Absolute: 0 10*3/uL (ref 0.0–0.5)
Eosinophils Relative: 0 %
HCT: 43 % (ref 39.0–52.0)
Hemoglobin: 14.4 g/dL (ref 13.0–17.0)
Immature Granulocytes: 2 %
Lymphocytes Relative: 4 %
Lymphs Abs: 0.4 10*3/uL — ABNORMAL LOW (ref 0.7–4.0)
MCH: 27.3 pg (ref 26.0–34.0)
MCHC: 33.5 g/dL (ref 30.0–36.0)
MCV: 81.6 fL (ref 80.0–100.0)
Monocytes Absolute: 0.4 10*3/uL (ref 0.1–1.0)
Monocytes Relative: 3 %
Neutro Abs: 9.9 10*3/uL — ABNORMAL HIGH (ref 1.7–7.7)
Neutrophils Relative %: 91 %
Platelets: 356 10*3/uL (ref 150–400)
RBC: 5.27 MIL/uL (ref 4.22–5.81)
RDW: 11.9 % (ref 11.5–15.5)
WBC: 10.8 10*3/uL — ABNORMAL HIGH (ref 4.0–10.5)
nRBC: 0 % (ref 0.0–0.2)

## 2019-01-31 LAB — GLUCOSE, CAPILLARY
Glucose-Capillary: 106 mg/dL — ABNORMAL HIGH (ref 70–99)
Glucose-Capillary: 193 mg/dL — ABNORMAL HIGH (ref 70–99)
Glucose-Capillary: 284 mg/dL — ABNORMAL HIGH (ref 70–99)
Glucose-Capillary: 226 mg/dL — ABNORMAL HIGH (ref 70–99)

## 2019-01-31 LAB — MAGNESIUM: Magnesium: 2.4 mg/dL (ref 1.7–2.4)

## 2019-01-31 LAB — BRAIN NATRIURETIC PEPTIDE: B Natriuretic Peptide: 31.6 pg/mL (ref 0.0–100.0)

## 2019-01-31 LAB — C-REACTIVE PROTEIN: CRP: <0.8 mg/dL (ref <1.0)

## 2019-01-31 LAB — D-DIMER, QUANTITATIVE: D-Dimer, Quant: 1.33 ug/mL-FEU — ABNORMAL HIGH (ref 0.00–0.50)

## 2019-01-31 NOTE — Progress Notes (Signed)
PROGRESS NOTE                                                                                                                                                                                                             Patient Demographics:    Nathan Perkins, is a 63 y.o. male, DOB - Apr 08, 1955, BWL:[id number]  Outpatient Primary MD for the patient is Monico Blitz, MD   Admit date - 01/24/2019   LOS - 6  Chief Complaint  Patient presents with  . Shortness of Breath       Brief Narrative: Patient is a 63 y.o. male with PMHx of insulin-dependent DM-2, HTN presented with 1 week history of cough, fever, myalgias and worsening shortness of breath.  Patient was found to have acute hypoxic respiratory failure in setting of COVID-19 pneumonia at [facility] course was complicated by rapidly worsening respiratory failure in spite of being treated with steroids, remdesivir and Actemra.  See below for further details.   Subjective:   Patient in bed, appears comfortable, denies any headache, no fever, no chest pain or pressure, no shortness of breath , no abdominal pain. No focal weakness.   Assessment  & Plan :   Acute Hypoxic Resp Failure due to Covid 19 Viral pneumonia: he has severe COVID-19 pneumonitis and acute hypoxic respiratory failure, now on 7-8 lit HF oxygen but in no distress however little anxious.  He has so far received full treatment including IV steroids, remdesivir, Actemra, convalescent plasma.  His inflammatory markers are finally trending down and he appears to have stabilized except for some ongoing anxiety.  He was encouraged to sit up in chair and daytime use flutter valve and I-S for many toiletry and prone when in bed.  He is now definitely showing clinical improvement albeit very gradually, he is now down to 7-8 L high flow nasal cannula oxygen from 15, appears to be in no distress,  continue present line of care he has finished his IV remdesivir course.  Will taper steroids very gradually on this patient.  Still has the potential to deteriorate but overall much improved than what he was at the time of admission.    COVID-19 Labs:  Recent Labs    01/29/19 0603 01/30/19 0525 01/31/19 0133  DDIMER 0.65* 1.05* 1.33*  CRP 1.4* 0.8 <0.8  Lab Results  Component Value Date   SARSCOV2NAA POSITIVE (A) 01/24/2019    Hepatic Function Latest Ref Rng & Units 01/31/2019 01/30/2019 01/29/2019  Total Protein 6.5 - 8.1 g/dL 6.1(L) 5.8(L) 6.1(L)  Albumin 3.5 - 5.0 g/dL 3.3(L) 3.0(L) 3.2(L)  AST 15 - 41 U/L 31 40 147(H)  ALT 0 - 44 U/L 81(H) 94(H) 129(H)  Alk Phosphatase 38 - 126 U/L 46 46 52  Total Bilirubin 0.3 - 1.2 mg/dL 0.8 0.4 0.8    COVID-19 Medications:  Steroids: 10/20>> Remdesivir: 10/21>>10/26 Actemra:10/21 x 1 Convalescent Plasma: 10/22   Transaminitis.  Likely due to combination of COVID-19 infection and Remdesivir.  Finishing remdesivir soon, symptom-free will monitor trend.    HTN: BP relatively controlled-continue amlodipine and follow.  Extreme anxiety.  Added Klonopin twice daily will monitor.  Hold for sedation.  Insulin-dependent DM-2: Currently on Lantus, premeal NovoLog and SSI.  Lantus and premeal adjusted on 01/30/2019.  Lab Results  Component Value Date   HGBA1C 8.6 (H) 01/24/2019   CBG (last 3)  Recent Labs    01/30/19 1624 01/30/19 2129 01/31/19 0718  GLUCAP 170* 193* 106*     Condition -  Fair  Family Communication  : Discussed with wife on 01/28/2019, 01/31/19.  Code Status :  Full Code  Diet :  Diet Order            Diet Carb Modified Fluid consistency: Thin; Room service appropriate? Yes  Diet effective now               Disposition Plan  :  Remain hospitalized  Barriers to discharge: Hypoxemia  Consults  :  None  Procedures  :  None  Antibiotics  :    Anti-infectives (From admission, onward)    Start     Dose/Rate Route Frequency Ordered Stop   01/26/19 1600  remdesivir 100 mg in sodium chloride 0.9 % 250 mL IVPB     100 mg 500 mL/hr over 30 Minutes Intravenous Every 24 hours 01/25/19 1445 01/29/19 1720   01/25/19 1600  remdesivir 200 mg in sodium chloride 0.9 % 250 mL IVPB     200 mg 500 mL/hr over 30 Minutes Intravenous Once 01/25/19 1445 01/25/19 1727      Inpatient Medications  Scheduled Meds: . albuterol  2 puff Inhalation Q6H  . amLODipine  5 mg Oral Daily  . aspirin  81 mg Oral Daily  . benzonatate  200 mg Oral TID  . clonazepam  0.5 mg Oral BID  . guaiFENesin  600 mg Oral BID  . insulin aspart  0-20 Units Subcutaneous TID WC  . insulin aspart  0-5 Units Subcutaneous QHS  . insulin aspart  14 Units Subcutaneous TID WC  . insulin detemir  45 Units Subcutaneous BID  . methylPREDNISolone (SOLU-MEDROL) injection  40 mg Intravenous Q12H  . pantoprazole  40 mg Oral Daily  . vitamin C  1,000 mg Oral Daily  . zinc sulfate  220 mg Oral Daily   Continuous Infusions:  PRN Meds:.albuterol, chlorpheniramine-HYDROcodone, labetalol, loperamide, [DISCONTINUED] ondansetron **OR** ondansetron (ZOFRAN) IV, sodium chloride, traMADol   Time Spent in minutes  35  See all Orders from today for further details   Lala Lund M.D on 01/31/2019 at 9:21 AM  To page go to [email or web address] - use universal password  [facility] -  Office  [phone]    Objective:   Vitals:   01/30/19 2041 01/31/19 0136 01/31/19 0454 01/31/19 0719  BP: (!) 153/87 (!) 141/87  137/86 135/84  Pulse:    88  Resp:  '20 20 20  ' Temp: 98.6 F (37 C) 98.1 F (36.7 C) (!) 97.5 F (36.4 C) 97.9 F (36.6 C)  TempSrc: Oral Oral Axillary Oral  SpO2:    92%  Weight:      Height:        Wt Readings from Last 3 Encounters:  01/25/19 90.1 kg  07/30/17 90.7 kg  01/07/15 92.5 kg     Intake/Output Summary (Last 24 hours) at 01/31/2019 0921 Last data filed at 01/30/2019 1200 Gross per 24 hour   Intake 360 ml  Output 250 ml  Net 110 ml     Physical Exam  Awake Alert, Oriented X 3, No new F.N deficits, Normal affect [place].AT,PERRAL Supple Neck,No JVD, No cervical lymphadenopathy appriciated.  Symmetrical Chest wall movement, Good air movement bilaterally, CTAB RRR,No Gallops, Rubs or new Murmurs, No Parasternal Heave +ve B.Sounds, Abd Soft, No tenderness, No organomegaly appriciated, No rebound - guarding or rigidity. No Cyanosis, Clubbing or edema, No new Rash or bruise    Data Review:    CBC Recent Labs  Lab 01/27/19 0205 01/28/19 0020 01/29/19 0603 01/30/19 0525 01/31/19 0133  WBC 10.3 12.0* 10.1 10.4 10.8*  HGB 14.0 15.1 14.7 11.1* 14.4  HCT 42.6 46.2 44.6 32.9* 43.0  PLT 286 318 316 243 356  MCV 81.5 82.4 82.4 82.3 81.6  MCH 26.8 26.9 27.2 27.8 27.3  MCHC 32.9 32.7 33.0 33.7 33.5  RDW 12.1 12.1 12.2 11.9 11.9  LYMPHSABS 0.5* 0.6* 0.6* 0.6* 0.4*  MONOABS 0.5 0.7 0.8 1.0 0.4  EOSABS 0.0 0.0 0.0 0.0 0.0  BASOSABS 0.0 0.0 0.0 0.0 0.0    Chemistries  Recent Labs  Lab 01/27/19 0205 01/28/19 0020 01/29/19 0603 01/30/19 0525 01/31/19 0133  NA 142 143 142 140 136  K 4.3 4.1 4.7 4.7 4.8  CL 104 101 102 101 99  CO2 '28 28 30 30 30  ' GLUCOSE 217* 112* 118* 107* 159*  BUN 36* 40* 31* 29* 30*  CREATININE 0.96 0.93 0.79 0.81 0.90  CALCIUM 8.7* 8.8* 8.5* 8.6* 8.4*  MG  --   --  2.6* 2.4 2.4  AST 37 59* 147* 40 31  ALT 27 47* 129* 94* 81*  ALKPHOS 45 52 52 46 46  BILITOT 0.6 0.9 0.8 0.4 0.8   ------------------------------------------------------------------------------------------------------------------ No results for input(s): CHOL, HDL, LDLCALC, TRIG, CHOLHDL, LDLDIRECT in the last 72 hours.  Lab Results  Component Value Date   HGBA1C 8.6 (H) 01/24/2019   ------------------------------------------------------------------------------------------------------------------ No results for input(s): TSH, T4TOTAL, T3FREE, THYROIDAB in the last 72 hours.   Invalid input(s): FREET3 ------------------------------------------------------------------------------------------------------------------ No results for input(s): VITAMINB12, FOLATE, FERRITIN, TIBC, IRON, RETICCTPCT in the last 72 hours.  Coagulation profile No results for input(s): INR, PROTIME in the last 168 hours.  Recent Labs    01/30/19 0525 01/31/19 0133  DDIMER 1.05* 1.33*    Cardiac Enzymes No results for input(s): CKMB, TROPONINI, MYOGLOBIN in the last 168 hours.  Invalid input(s): CK ------------------------------------------------------------------------------------------------------------------    Component Value Date/Time   BNP 31.6 01/31/2019 0133    Micro Results Recent Results (from the past 240 hour(s))  Blood Culture (routine x 2)     Status: None   Collection Time: 01/24/19  9:36 PM   Specimen: Left Antecubital; Blood  Result Value Ref Range Status   Specimen Description LEFT ANTECUBITAL  Final   Special Requests   Final    BOTTLES DRAWN AEROBIC AND  ANAEROBIC Blood Culture adequate volume   Culture   Final    NO GROWTH 5 DAYS Performed at [facility], [address], [place], [place] [address]    Report Status 01/29/2019 FINAL  Final  Blood Culture (routine x 2)     Status: None   Collection Time: 01/24/19 10:44 PM   Specimen: BLOOD LEFT HAND  Result Value Ref Range Status   Specimen Description BLOOD LEFT HAND  Final   Special Requests   Final    BOTTLES DRAWN AEROBIC AND ANAEROBIC Blood Culture results may not be optimal due to an excessive volume of blood received in culture bottles   Culture   Final    NO GROWTH 5 DAYS Performed at [facility], [address], [place], [place] [address]    Report Status 01/30/2019 FINAL  Final  SARS Coronavirus 2 by RT PCR (hospital order, performed in [facility]) Nasopharyngeal Nasopharyngeal Swab     Status: Abnormal   Collection Time: 01/24/19 10:45 PM   Specimen: Nasopharyngeal Swab   Result Value Ref Range Status   SARS Coronavirus 2 POSITIVE (A) NEGATIVE Final    Comment: RESULT CALLED TO, READ BACK BY AND VERIFIED WITH: L ANDREW,RN '@2345'  01/24/19 MKELLY (NOTE) If result is NEGATIVE SARS-CoV-2 target nucleic acids are NOT DETECTED. The SARS-CoV-2 RNA is generally detectable in upper and lower  respiratory specimens during the acute phase of infection. The lowest  concentration of SARS-CoV-2 viral copies this assay can detect is 250  copies / mL. A negative result does not preclude SARS-CoV-2 infection  and should not be used as the sole basis for treatment or other  patient management decisions.  A negative result may occur with  improper specimen collection / handling, submission of specimen other  than nasopharyngeal swab, presence of viral mutation(s) within the  areas targeted by this assay, and inadequate number of viral copies  (<250 copies / mL). A negative result must be combined with clinical  observations, patient history, and epidemiological information. If result is POSITIVE SARS-CoV-2 target nucleic acids are DETECTED. The  SARS-CoV-2 RNA is generally detectable in upper and lower  respiratory specimens during the acute phase of infection.  Positive  results are indicative of active infection with SARS-CoV-2.  Clinical  correlation with patient history and other diagnostic information is  necessary to determine patient infection status.  Positive results do  not rule out bacterial infection or co-infection with other viruses. If result is PRESUMPTIVE POSTIVE SARS-CoV-2 nucleic acids MAY BE PRESENT.   A presumptive positive result was obtained on the submitted specimen  and confirmed on repeat testing.  While 2019 novel coronavirus  (SARS-CoV-2) nucleic acids may be present in the submitted sample  additional confirmatory testing may be necessary for epidemiological  and / or clinical management purposes  to differentiate between  SARS-CoV-2 and  other Sarbecovirus currently known to infect humans.  If clinically indicated additional testing with an alternate test  methodology [phone]) is a dvised. The SARS-CoV-2 RNA is generally  detectable in upper and lower respiratory specimens during the acute  phase of infection. The expected result is Negative. Fact Sheet for Patients:  StrictlyIdeas.no Fact Sheet for Healthcare Providers: BankingDealers.co.za This test is not yet approved or cleared by the [place] FDA and has been authorized for detection and/or diagnosis of SARS-CoV-2 by FDA under an Emergency Use Authorization (EUA).  This EUA will remain in effect (meaning this test can be used) for the duration of  the COVID-19 declaration under Section 564(b)(1) of the Act, 21 U.S.C. section 360bbb-3(b)(1), unless the authorization is terminated or revoked sooner. Performed at [facility], [address] [place], [place] [address]     Radiology Reports Dg Chest [place] 1 View  Result Date: 01/24/2019 CLINICAL DATA:  Difficulty breathing, concern for COVID-19 EXAM: PORTABLE CHEST 1 VIEW COMPARISON:  Radiograph 01/23/2019 FINDINGS: Worsening bilateral interstitial and airspace opacities throughout both lungs. Peripheral predominance most notable in the left lung base. Lung volumes are diminished with streaky areas of atelectasis as well. No pneumothorax or visible effusion. Cardiomediastinal contours are stable accounting for differences in technique and lung volumes. Degenerative changes are present in the imaged spine and shoulders. Cervical spinal fusion hardware is incompletely assessed. IMPRESSION: Worsening bilateral interstitial and airspace opacities throughout both lungs, compatible with a multifocal pneumonia such as atypical viral pneumonia including COVID-19. Electronically Signed   By: Lovena Le M.D.   On: 01/24/2019 22:17   Dg Chest Port 1v Same Day  Result Date:  01/26/2019 CLINICAL DATA:  Shortness of breath EXAM: PORTABLE CHEST 1 VIEW COMPARISON:  01/24/2019 FINDINGS: Heart is borderline enlarged. There diffuse bilateral airspace opacities noted with some peripheral predominance. This is worsened since prior study. Low lung volumes. No visible effusions. IMPRESSION: Cardiomegaly. Patchy bilateral airspace opacities with peripheral predominance. This is worsened since prior study concerning for multifocal pneumonia. Electronically Signed   By: Rolm Baptise M.D.   On: 01/26/2019 09:14

## 2019-01-31 NOTE — Progress Notes (Signed)
Physical Therapy Treatment Patient Details Name: Nathan Perkins MRN: [id number] DOB: 1956-04-04 Today's Date: 01/31/2019    History of Present Illness is a 63 y.o. male with PMHx of insulin-dependent DM-2, HTN presented  to ER with 1 week history of cough, fever, myalgias and worsening shortness of breath.  Patient was found to have acute hypoxic respiratory failure in setting of COVID-19 pneumonia .Admitted 01/24/19    PT Comments    Pt tolerated tx well was able to ambulate on 6L/min via HFNC and maintain sats between 89% and 94%, ambulated approx 159ft with no AD and min guard assist. Did note that pt was staggering some and listing left and right.    Follow Up Recommendations  No PT follow up     Equipment Recommendations  None recommended by PT    Recommendations for Other Services       Precautions / Restrictions Precautions Precautions: Fall Precaution Comments: on HFNC probe was on ear but would not stay returned to right middle finger and had better readings Restrictions Weight Bearing Restrictions: No    Mobility  Bed Mobility Overal bed mobility: Needs Assistance             General bed mobility comments: pt in recliner at therapist arrival  Transfers Overall transfer level: Needs assistance Equipment used: None Transfers: Sit to/from Stand Sit to Stand: Supervision            Ambulation/Gait Ambulation/Gait assistance: Min guard Gait Distance (Feet): 120 Feet Assistive device: None Gait Pattern/deviations: Drifts right/left;Staggering right Gait velocity: very slow   General Gait Details: ambulated approx 178ft with no AD and min guard assist, noted listing left and right and some staggering but no falls   Stairs             Wheelchair Mobility    Modified Rankin (Stroke Patients Only)       Balance Overall balance assessment: Needs assistance Sitting-balance support: Feet unsupported Sitting balance-Leahy Scale: Good      Standing balance support: During functional activity Standing balance-Leahy Scale: Fair                              Cognition Arousal/Alertness: Awake/alert Behavior During Therapy: WFL for tasks assessed/performed Overall Cognitive Status: Within Functional Limits for tasks assessed                                        Exercises Other Exercises Other Exercises: incentive spirometer    General Comments General comments (skin integrity, edema, etc.): ambulated on 6L/min HFNC sats in 90s throughout with exception of one drop to 89% but very brief and able to recover to 90s. Pt left sitting in recliner on 4.5L/min via HFNC, monitored on pedi finger probe      Pertinent Vitals/Pain Pain Assessment: No/denies pain    Home Living                      Prior Function            PT Goals (current goals can now be found in the care plan section) Acute Rehab PT Goals Patient Stated Goal: get better and get out of hospital PT Goal Formulation: With patient Time For Goal Achievement: 02/11/19 Potential to Achieve Goals: Good Progress towards PT goals: Progressing toward goals  Frequency    Min 3X/week      PT Plan Current plan remains appropriate    Co-evaluation              AM-PAC PT "6 Clicks" Mobility   Outcome Measure  Help needed turning from your back to your side while in a flat bed without using bedrails?: None Help needed moving from lying on your back to sitting on the side of a flat bed without using bedrails?: A Little Help needed moving to and from a bed to a chair (including a wheelchair)?: A Little Help needed standing up from a chair using your arms (e.g., wheelchair or bedside chair)?: A Little Help needed to walk in hospital room?: A Little Help needed climbing 3-5 steps with a railing? : A Lot 6 Click Score: 18    End of Session Equipment Utilized During Treatment: Oxygen Activity Tolerance:  Treatment limited secondary to medical complications (Comment) Patient left: in chair;with call bell/phone within reach Nurse Communication: Mobility status PT Visit Diagnosis: Other abnormalities of gait and mobility (R26.89);Muscle weakness (generalized) (M62.81)     Time: [id number] PT Time Calculation (min) (ACUTE ONLY): 17 min  Charges:  $Gait Training: 8-22 mins                     Horald Chestnut, PT    Delford Field 01/31/2019, 3:57 PM

## 2019-02-01 LAB — CBC WITH DIFFERENTIAL/PLATELET
Abs Immature Granulocytes: 0.29 10*3/uL — ABNORMAL HIGH (ref 0.00–0.07)
Basophils Absolute: 0 10*3/uL (ref 0.0–0.1)
Basophils Relative: 0 %
Eosinophils Absolute: 0 10*3/uL (ref 0.0–0.5)
Eosinophils Relative: 0 %
HCT: 43.1 % (ref 39.0–52.0)
Hemoglobin: 14.3 g/dL (ref 13.0–17.0)
Immature Granulocytes: 2 %
Lymphocytes Relative: 4 %
Lymphs Abs: 0.5 10*3/uL — ABNORMAL LOW (ref 0.7–4.0)
MCH: 26.9 pg (ref 26.0–34.0)
MCHC: 33.2 g/dL (ref 30.0–36.0)
MCV: 81 fL (ref 80.0–100.0)
Monocytes Absolute: 0.8 10*3/uL (ref 0.1–1.0)
Monocytes Relative: 6 %
Neutro Abs: 11.6 10*3/uL — ABNORMAL HIGH (ref 1.7–7.7)
Neutrophils Relative %: 88 %
Platelets: 412 10*3/uL — ABNORMAL HIGH (ref 150–400)
RBC: 5.32 MIL/uL (ref 4.22–5.81)
RDW: 11.9 % (ref 11.5–15.5)
WBC: 13.2 10*3/uL — ABNORMAL HIGH (ref 4.0–10.5)
nRBC: 0 % (ref 0.0–0.2)

## 2019-02-01 LAB — COMPREHENSIVE METABOLIC PANEL
ALT: 73 U/L — ABNORMAL HIGH (ref 0–44)
AST: 28 U/L (ref 15–41)
Albumin: 3 g/dL — ABNORMAL LOW (ref 3.5–5.0)
Alkaline Phosphatase: 43 U/L (ref 38–126)
Anion gap: 11 (ref 5–15)
BUN: 30 mg/dL — ABNORMAL HIGH (ref 8–23)
CO2: 23 mmol/L (ref 22–32)
Calcium: 8.6 mg/dL — ABNORMAL LOW (ref 8.9–10.3)
Chloride: 100 mmol/L (ref 98–111)
Creatinine, Ser: 0.83 mg/dL (ref 0.61–1.24)
GFR calc Af Amer: >60 mL/min (ref >60)
GFR calc non Af Amer: >60 mL/min (ref >60)
Glucose, Bld: 81 mg/dL (ref 70–99)
Potassium: 4.9 mmol/L (ref 3.5–5.1)
Sodium: 134 mmol/L — ABNORMAL LOW (ref 135–145)
Total Bilirubin: 0.4 mg/dL (ref 0.3–1.2)
Total Protein: 5.6 g/dL — ABNORMAL LOW (ref 6.5–8.1)

## 2019-02-01 LAB — GLUCOSE, CAPILLARY
Glucose-Capillary: 36 mg/dL — CL (ref 70–99)
Glucose-Capillary: 174 mg/dL — ABNORMAL HIGH (ref 70–99)
Glucose-Capillary: 49 mg/dL — ABNORMAL LOW (ref 70–99)
Glucose-Capillary: 297 mg/dL — ABNORMAL HIGH (ref 70–99)
Glucose-Capillary: 308 mg/dL — ABNORMAL HIGH (ref 70–99)
Glucose-Capillary: 275 mg/dL — ABNORMAL HIGH (ref 70–99)
Glucose-Capillary: 320 mg/dL — ABNORMAL HIGH (ref 70–99)

## 2019-02-01 LAB — C-REACTIVE PROTEIN: CRP: <0.8 mg/dL (ref <1.0)

## 2019-02-01 LAB — BRAIN NATRIURETIC PEPTIDE: B Natriuretic Peptide: 49.1 pg/mL (ref 0.0–100.0)

## 2019-02-01 LAB — MAGNESIUM: Magnesium: 2.3 mg/dL (ref 1.7–2.4)

## 2019-02-01 LAB — D-DIMER, QUANTITATIVE: D-Dimer, Quant: 1.1 ug/mL-FEU — ABNORMAL HIGH (ref 0.00–0.50)

## 2019-02-01 MED ORDER — INSULIN DETEMIR 100 UNIT/ML ~~LOC~~ SOLN
38.0000 [IU] | Freq: Two times a day (BID) | SUBCUTANEOUS | Status: DC
  Administered 2019-02-01 (×2): 38 [IU] via SUBCUTANEOUS
  Filled 2019-02-01 (×3): qty 0.38

## 2019-02-01 MED ORDER — METHYLPREDNISOLONE SODIUM SUCC 40 MG IJ SOLR
40.0000 mg | Freq: Every day | INTRAMUSCULAR | Status: DC
  Administered 2019-02-01: 40 mg via INTRAVENOUS

## 2019-02-01 MED ORDER — INSULIN ASPART 100 UNIT/ML ~~LOC~~ SOLN
5.0000 [IU] | Freq: Three times a day (TID) | SUBCUTANEOUS | Status: DC
  Administered 2019-02-01 (×2): 5 [IU] via SUBCUTANEOUS

## 2019-02-01 MED ORDER — DEXTROSE 50 % IV SOLN
1.0000 | INTRAVENOUS | Status: AC
  Administered 2019-02-01: 08:00:00 50 mL via INTRAVENOUS

## 2019-02-01 MED ORDER — DEXTROSE 50 % IV SOLN
INTRAVENOUS | Status: AC
  Administered 2019-02-01: 50 mL via INTRAVENOUS
  Filled 2019-02-01: qty 50

## 2019-02-01 MED ORDER — LACTATED RINGERS IV SOLN
INTRAVENOUS | Status: AC
  Administered 2019-02-01: 08:00:00 via INTRAVENOUS

## 2019-02-01 NOTE — Progress Notes (Signed)
PROGRESS NOTE                                                                                                                                                                                                             Patient Demographics:    Nathan Perkins, is a 63 y.o. male, DOB - 04-25-1955, [id number]  Outpatient Primary MD for the patient is Monico Blitz, MD   Admit date - 01/24/2019   LOS - 7  Chief Complaint  Patient presents with  . Shortness of Breath       Brief Narrative: Patient is a 63 y.o. male with PMHx of insulin-dependent DM-2, HTN presented with 1 week history of cough, fever, myalgias and worsening shortness of breath.  Patient was found to have acute hypoxic respiratory failure in setting of COVID-19 pneumonia at [facility] course was complicated by rapidly worsening respiratory failure in spite of being treated with steroids, remdesivir and Actemra.  See below for further details.   Subjective:   Patient in bed, appears comfortable, denies any headache, no fever, no chest pain or pressure, no shortness of breath , no abdominal pain. No focal weakness.   Assessment  & Plan :   Acute Hypoxic Resp Failure due to Covid 19 Viral pneumonia: he has severe COVID-19 pneumonitis and acute hypoxic respiratory failure, now on 2-3 lit [place] oxygen down from 15 L high flow nasal cannula oxygen, he is now in no distress but remains a little anxious.   He has so far received full treatment including IV steroids, remdesivir, Actemra, convalescent plasma.  His inflammatory markers are finally trending down and he appears to have stabilized except for some ongoing anxiety.  Need to taper down steroids.  Prepare for discharge in the next 1 to 2 days.  He and his wife has been told that he might go home on a low-dose oxygen if needed.     COVID-19 Labs:  Recent Labs    01/30/19 0525 01/31/19 0133  02/01/19 0135  DDIMER 1.05* 1.33* 1.10*  CRP 0.8 <0.8 <0.8    Lab Results  Component Value Date   SARSCOV2NAA POSITIVE (A) 01/24/2019    Hepatic Function Latest Ref Rng & Units 02/01/2019 01/31/2019 01/30/2019  Total Protein 6.5 - 8.1 g/dL 5.6(L) 6.1(L) 5.8(L)  Albumin 3.5 - 5.0 g/dL 3.0(L) 3.3(L) 3.0(L)  AST 15 - 41 U/L 28 31 40  ALT 0 - 44 U/L 73(H) 81(H) 94(H)  Alk Phosphatase 38 - 126 U/L 43 46 46  Total Bilirubin 0.3 - 1.2 mg/dL 0.4 0.8 0.4    COVID-19 Medications:  Steroids: 10/20>> Remdesivir: 10/21>>10/26 Actemra:10/21 x 1 Convalescent Plasma: 10/22   Transaminitis.  Likely due to combination of COVID-19 infection and Remdesivir.  Finishing remdesivir soon, symptom-free will monitor trend.    HTN: BP relatively controlled-continue amlodipine and follow.  Extreme anxiety.  Added Klonopin twice daily will monitor.  Hold for sedation.  Insulin-dependent DM-2: Currently on Lantus, premeal NovoLog and SSI.  Lantus and premeal adjusted on 01/30/2019.  Lab Results  Component Value Date   HGBA1C 8.6 (H) 01/24/2019   CBG (last 3)  Recent Labs    02/01/19 0714 02/01/19 0737 02/01/19 0758  GLUCAP 36* 49* 174*     Condition -  Fair  Family Communication  : Discussed with wife on 01/28/2019, 01/31/19.  Code Status :  Full Code  Diet :  Diet Order            Diet Carb Modified Fluid consistency: Thin; Room service appropriate? Yes  Diet effective now               Disposition Plan  :  Remain hospitalized  Barriers to discharge: Hypoxemia  Consults  :  None  Procedures  :  None  Antibiotics  :    Anti-infectives (From admission, onward)   Start     Dose/Rate Route Frequency Ordered Stop   01/26/19 1600  remdesivir 100 mg in sodium chloride 0.9 % 250 mL IVPB     100 mg 500 mL/hr over 30 Minutes Intravenous Every 24 hours 01/25/19 1445 01/29/19 1720   01/25/19 1600  remdesivir 200 mg in sodium chloride 0.9 % 250 mL IVPB     200 mg 500 mL/hr  over 30 Minutes Intravenous Once 01/25/19 1445 01/25/19 1727      Inpatient Medications  Scheduled Meds: . albuterol  2 puff Inhalation Q6H  . amLODipine  5 mg Oral Daily  . aspirin  81 mg Oral Daily  . benzonatate  200 mg Oral TID  . clonazepam  0.5 mg Oral BID  . guaiFENesin  600 mg Oral BID  . insulin aspart  0-20 Units Subcutaneous TID WC  . insulin aspart  0-5 Units Subcutaneous QHS  . insulin aspart  5 Units Subcutaneous TID WC  . insulin detemir  38 Units Subcutaneous BID  . methylPREDNISolone (SOLU-MEDROL) injection  40 mg Intravenous Daily  . pantoprazole  40 mg Oral Daily  . vitamin C  1,000 mg Oral Daily  . zinc sulfate  220 mg Oral Daily   Continuous Infusions: . lactated ringers 100 mL/hr at 02/01/19 0758   PRN Meds:.albuterol, chlorpheniramine-HYDROcodone, labetalol, loperamide, [DISCONTINUED] ondansetron **OR** ondansetron (ZOFRAN) IV, sodium chloride, traMADol   Time Spent in minutes  35  See all Orders from today for further details   Lala Lund M.D on 02/01/2019 at 9:45 AM  To page go to [email or web address] - use universal password  [facility] -  Office  [phone]    Objective:   Vitals:   01/31/19 1537 01/31/19 1915 02/01/19 0454 02/01/19 0716  BP:  134/85 117/85 119/84  Pulse:  85 61 72  Resp: _0 Temp: 97.9 F (36.6 C) 97.7 F (36.5 C) 98.2 F (36.8 C) 97.7 F (36.5 C)  TempSrc: Oral Oral Oral Axillary  SpO2: 90% 90% 90% (!) 86%  Weight:      Height:        Wt Readings from Last 3 Encounters:  01/25/19 90.1 kg  07/30/17 90.7 kg  01/07/15 92.5 kg     Intake/Output Summary (Last 24 hours) at 02/01/2019 0945 Last data filed at 02/01/2019 0100 Gross per 24 hour  Intake -  Output 1100 ml  Net -1100 ml     Physical Exam  Awake Alert, Oriented X 3, No new F.N deficits, anxious affect [place].AT,PERRAL Supple Neck,No JVD, No cervical lymphadenopathy appriciated.  Symmetrical Chest wall movement, Good air movement  bilaterally, CTAB RRR,No Gallops, Rubs or new Murmurs, No Parasternal Heave +ve B.Sounds, Abd Soft, No tenderness, No organomegaly appriciated, No rebound - guarding or rigidity. No Cyanosis, Clubbing or edema, No new Rash or bruise    Data Review:    CBC Recent Labs  Lab 01/28/19 0020 01/29/19 0603 01/30/19 0525 01/31/19 0133 02/01/19 0135  WBC 12.0* 10.1 10.4 10.8* 13.2*  HGB 15.1 14.7 11.1* 14.4 14.3  HCT 46.2 44.6 32.9* 43.0 43.1  PLT 318 316 243 356 412*  MCV 82.4 82.4 82.3 81.6 81.0  MCH 26.9 27.2 27.8 27.3 26.9  MCHC 32.7 33.0 33.7 33.5 33.2  RDW 12.1 12.2 11.9 11.9 11.9  LYMPHSABS 0.6* 0.6* 0.6* 0.4* 0.5*  MONOABS 0.7 0.8 1.0 0.4 0.8  EOSABS 0.0 0.0 0.0 0.0 0.0  BASOSABS 0.0 0.0 0.0 0.0 0.0    Chemistries  Recent Labs  Lab 01/28/19 0020 01/29/19 0603 01/30/19 0525 01/31/19 0133 02/01/19 0135  NA 143 142 140 136 134*  K 4.1 4.7 4.7 4.8 4.9  CL 101 102 101 99 100  CO2 _0 GLUCOSE 112* 118* 107* 159* 81  BUN 40* 31* 29* 30* 30*  CREATININE 0.93 0.79 0.81 0.90 0.83  CALCIUM 8.8* 8.5* 8.6* 8.4* 8.6*  MG  --  2.6* 2.4 2.4 2.3  AST 59* 147* 40 31 28  ALT 47* 129* 94* 81* 73*  ALKPHOS 52 52 46 46 43  BILITOT 0.9 0.8 0.4 0.8 0.4   ------------------------------------------------------------------------------------------------------------------ No results for input(s): CHOL, HDL, LDLCALC, TRIG, CHOLHDL, LDLDIRECT in the last 72 hours.  Lab Results  Component Value Date   HGBA1C 8.6 (H) 01/24/2019   ------------------------------------------------------------------------------------------------------------------ No results for input(s): TSH, T4TOTAL, T3FREE, THYROIDAB in the last 72 hours.  Invalid input(s): FREET3 ------------------------------------------------------------------------------------------------------------------ No results for input(s): VITAMINB12, FOLATE, FERRITIN, TIBC, IRON, RETICCTPCT in the last 72 hours.  Coagulation  profile No results for input(s): INR, PROTIME in the last 168 hours.  Recent Labs    01/31/19 0133 02/01/19 0135  DDIMER 1.33* 1.10*    Cardiac Enzymes No results for input(s): CKMB, TROPONINI, MYOGLOBIN in the last 168 hours.  Invalid input(s): CK ------------------------------------------------------------------------------------------------------------------    Component Value Date/Time   BNP 49.1 02/01/2019 0135    Micro Results Recent Results (from the past 240 hour(s))  Blood Culture (routine x 2)     Status: None   Collection Time: 01/24/19  9:36 PM   Specimen: Left Antecubital; Blood  Result Value Ref Range Status   Specimen Description LEFT ANTECUBITAL  Final   Special Requests   Final    BOTTLES DRAWN AEROBIC AND ANAEROBIC Blood Culture adequate volume   Culture   Final    NO GROWTH 5 DAYS Performed at [facility], [address] [place], [place] [address]    Report Status 01/29/2019 FINAL  Final  Blood Culture (routine  x 2)     Status: None   Collection Time: 01/24/19 10:44 PM   Specimen: BLOOD LEFT HAND  Result Value Ref Range Status   Specimen Description BLOOD LEFT HAND  Final   Special Requests   Final    BOTTLES DRAWN AEROBIC AND ANAEROBIC Blood Culture results may not be optimal due to an excessive volume of blood received in culture bottles   Culture   Final    NO GROWTH 5 DAYS Performed at [facility], [address], [place], [place] [address]    Report Status 01/30/2019 FINAL  Final  SARS Coronavirus 2 by RT PCR (hospital order, performed in [place] hospital lab) Nasopharyngeal Nasopharyngeal Swab     Status: Abnormal   Collection Time: 01/24/19 10:45 PM   Specimen: Nasopharyngeal Swab  Result Value Ref Range Status   SARS Coronavirus 2 POSITIVE (A) NEGATIVE Final    Comment: RESULT CALLED TO, READ BACK BY AND VERIFIED WITH: L ANDREW,RN _0  01/24/19 MKELLY (NOTE) If result is NEGATIVE SARS-CoV-2 target nucleic acids are NOT DETECTED.  The SARS-CoV-2 RNA is generally detectable in upper and lower  respiratory specimens during the acute phase of infection. The lowest  concentration of SARS-CoV-2 viral copies this assay can detect is 250  copies / mL. A negative result does not preclude SARS-CoV-2 infection  and should not be used as the sole basis for treatment or other  patient management decisions.  A negative result may occur with  improper specimen collection / handling, submission of specimen other  than nasopharyngeal swab, presence of viral mutation(s) within the  areas targeted by this assay, and inadequate number of viral copies  (<250 copies / mL). A negative result must be combined with clinical  observations, patient history, and epidemiological information. If result is POSITIVE SARS-CoV-2 target nucleic acids are DETECTED. The  SARS-CoV-2 RNA is generally detectable in upper and lower  respiratory specimens during the acute phase of infection.  Positive  results are indicative of active infection with SARS-CoV-2.  Clinical  correlation with patient history and other diagnostic information is  necessary to determine patient infection status.  Positive results do  not rule out bacterial infection or co-infection with other viruses. If result is PRESUMPTIVE POSTIVE SARS-CoV-2 nucleic acids MAY BE PRESENT.   A presumptive positive result was obtained on the submitted specimen  and confirmed on repeat testing.  While 2019 novel coronavirus  (SARS-CoV-2) nucleic acids may be present in the submitted sample  additional confirmatory testing may be necessary for epidemiological  and / or clinical management purposes  to differentiate between  SARS-CoV-2 and other Sarbecovirus currently known to infect humans.  If clinically indicated additional testing with an alternate test  methodology [phone]) is a dvised. The SARS-CoV-2 RNA is generally  detectable in upper and lower respiratory specimens during the acute   phase of infection. The expected result is Negative. Fact Sheet for Patients:  StrictlyIdeas.no Fact Sheet for Healthcare Providers: BankingDealers.co.za This test is not yet approved or cleared by the [place] FDA and has been authorized for detection and/or diagnosis of SARS-CoV-2 by FDA under an Emergency Use Authorization (EUA).  This EUA will remain in effect (meaning this test can be used) for the duration of the COVID-19 declaration under Section 564(b)(1) of the Act, 21 U.S.C. section 360bbb-3(b)(1), unless the authorization is terminated or revoked sooner. Performed at [facility], [address] [place], [place] [address]     Radiology Reports Dg Chest [place] 1  View  Result Date: 01/24/2019 CLINICAL DATA:  Difficulty breathing, concern for COVID-19 EXAM: PORTABLE CHEST 1 VIEW COMPARISON:  Radiograph 01/23/2019 FINDINGS: Worsening bilateral interstitial and airspace opacities throughout both lungs. Peripheral predominance most notable in the left lung base. Lung volumes are diminished with streaky areas of atelectasis as well. No pneumothorax or visible effusion. Cardiomediastinal contours are stable accounting for differences in technique and lung volumes. Degenerative changes are present in the imaged spine and shoulders. Cervical spinal fusion hardware is incompletely assessed. IMPRESSION: Worsening bilateral interstitial and airspace opacities throughout both lungs, compatible with a multifocal pneumonia such as atypical viral pneumonia including COVID-19. Electronically Signed   By: Lovena Le M.D.   On: 01/24/2019 22:17   Dg Chest Port 1v Same Day  Result Date: 01/26/2019 CLINICAL DATA:  Shortness of breath EXAM: PORTABLE CHEST 1 VIEW COMPARISON:  01/24/2019 FINDINGS: Heart is borderline enlarged. There diffuse bilateral airspace opacities noted with some peripheral predominance. This is worsened since prior study. Low lung  volumes. No visible effusions. IMPRESSION: Cardiomegaly. Patchy bilateral airspace opacities with peripheral predominance. This is worsened since prior study concerning for multifocal pneumonia. Electronically Signed   By: Rolm Baptise M.D.   On: 01/26/2019 09:14

## 2019-02-01 NOTE — Progress Notes (Signed)
Occupational Therapy Treatment Patient Details Name: Nathan Perkins MRN: [id number] DOB: [birth year] Today's Date: 02/01/2019    History of present illness is a 63 y.o. male with PMHx of insulin-dependent DM-2, HTN presented  to ER with 1 week history of cough, fever, myalgias and worsening shortness of breath.  Patient was found to have acute hypoxic respiratory failure in setting of COVID-19 pneumonia .Admitted 01/24/19   OT comments  Ambulated in room to sink to complete grooming task. Desat to low 80s on 4L. Cues for slow pursed lip breathing - required bump to 6l to increase O2 to 88. Emphasized importance of use of incentive spirometer and flutter valve. Able to pull @ 500 ml on IS with significant coughing at end of inspiration. Encouraged pt to stand and complete marching in place every hour. Pt verbalized understanding.  Will continue to follow acutely.   Follow Up Recommendations  No OT follow up;Supervision/Assistance - 24 hour    Equipment Recommendations  3 in 1 bedside commode    Recommendations for Other Services PT consult    Precautions / Restrictions Precautions Precautions: Fall Precaution Comments: used ear and L hand; difficulty getting reading       Mobility Bed Mobility               General bed mobility comments: OOB in recliner  Transfers Overall transfer level: Needs assistance   Transfers: Sit to/from Stand Sit to Stand: Supervision              Balance Overall balance assessment: Needs assistance           Standing balance-Leahy Scale: Fair                             ADL either performed or assessed with clinical judgement   ADL       Grooming: Set up;Standing   Upper Body Bathing: Set up;Sitting               Toilet Transfer: Min guard;Ambulation           Functional mobility during ADLs: Min guard(appears unsteady at times) General ADL Comments: NT states pt did sink bath. Pt states "i did it  but it was tough". Educated on need for energy conservaiton strategies. Wrked on pursed lip breathing during ADL and mobility.      Vision       Perception     Praxis      Cognition Arousal/Alertness: Awake/alert Behavior During Therapy: WFL for tasks assessed/performed Overall Cognitive Status: Within Functional Limits for tasks assessed                                          Exercises General Exercises - Upper Extremity Shoulder Flexion: Strengthening;Both;10 reps;Seated Theraband Level (Shoulder Flexion): Level 2 (Red) Shoulder Horizontal ABduction: Strengthening;Both;10 reps;Seated Theraband Level (Shoulder Horizontal Abduction): Level 2 (Red) Other Exercises Other Exercises: incentive spirometer x 10 - able to pull 500-755ml; coughing after each breath Other Exercises: flutter valve x 10 Other Exercises: upright posture to open up chest with BUE abducted x 5 Other Exercises: Educated on deep breathing by using hands as tactile feedback   Shoulder Instructions       General Comments      Pertinent Vitals/ Pain       Pain Assessment: Faces Faces Pain  Scale: Hurts little more Pain Location: chest wtih coughing  Pain Descriptors / Indicators: Discomfort Pain Intervention(s): Limited activity within patient's tolerance  Home Living                                          Prior Functioning/Environment              Frequency  Min 3X/week        Progress Toward Goals  OT Goals(current goals can now be found in the care plan section)  Progress towards OT goals: Progressing toward goals  Acute Rehab OT Goals Patient Stated Goal: get better and get out of hospital OT Goal Formulation: With patient Time For Goal Achievement: 02/12/19 Potential to Achieve Goals: Good ADL Goals Pt Will Perform Grooming: standing;Independently Pt Will Perform Lower Body Dressing: sit to/from stand;Independently Pt Will Transfer to  Toilet: ambulating;regular height toilet;Independently Pt Will Perform Toileting - Clothing Manipulation and hygiene: Independently;sit to/from stand;sitting/lateral leans Pt Will Perform Tub/Shower Transfer: Shower transfer;3 in 1;ambulating;Independently Additional ADL Goal #1: Pt will independently verbalize three energy conservation strategies for ADLs/IADLs  Plan Discharge plan remains appropriate    Co-evaluation                 AM-PAC OT "6 Clicks" Daily Activity     Outcome Measure   Help from another person eating meals?: None Help from another person taking care of personal grooming?: A Little Help from another person toileting, which includes using toliet, bedpan, or urinal?: A Little Help from another person bathing (including washing, rinsing, drying)?: A Little Help from another person to put on and taking off regular upper body clothing?: A Little Help from another person to put on and taking off regular lower body clothing?: A Little 6 Click Score: 19    End of Session Equipment Utilized During Treatment: Oxygen(4-6 L during mobility)  OT Visit Diagnosis: Unsteadiness on feet (R26.81);Other abnormalities of gait and mobility (R26.89);Muscle weakness (generalized) (M62.81)   Activity Tolerance Patient tolerated treatment well   Patient Left in chair;with call bell/phone within reach   Nurse Communication Mobility status        Time: [id number] OT Time Calculation (min): 36 min  Charges: OT General Charges $OT Visit: 1 Visit OT Treatments $Self Care/Home Management : 8-22 mins $Therapeutic Exercise: 8-22 mins  Maurie Boettcher, OT/L   Acute OT Clinical Specialist Acute Rehabilitation Services Pager [phone] Office [phone]    [facility] 02/01/2019, 3:50 PM

## 2019-02-02 LAB — GLUCOSE, CAPILLARY
Glucose-Capillary: 117 mg/dL — ABNORMAL HIGH (ref 70–99)
Glucose-Capillary: 62 mg/dL — ABNORMAL LOW (ref 70–99)
Glucose-Capillary: 187 mg/dL — ABNORMAL HIGH (ref 70–99)
Glucose-Capillary: 242 mg/dL — ABNORMAL HIGH (ref 70–99)
Glucose-Capillary: 260 mg/dL — ABNORMAL HIGH (ref 70–99)
Glucose-Capillary: 398 mg/dL — ABNORMAL HIGH (ref 70–99)

## 2019-02-02 LAB — COMPREHENSIVE METABOLIC PANEL
ALT: 57 U/L — ABNORMAL HIGH (ref 0–44)
AST: 22 U/L (ref 15–41)
Albumin: 3 g/dL — ABNORMAL LOW (ref 3.5–5.0)
Alkaline Phosphatase: 46 U/L (ref 38–126)
Anion gap: 7 (ref 5–15)
BUN: 26 mg/dL — ABNORMAL HIGH (ref 8–23)
CO2: 27 mmol/L (ref 22–32)
Calcium: 8.6 mg/dL — ABNORMAL LOW (ref 8.9–10.3)
Chloride: 99 mmol/L (ref 98–111)
Creatinine, Ser: 0.78 mg/dL (ref 0.61–1.24)
GFR calc Af Amer: >60 mL/min (ref >60)
GFR calc non Af Amer: >60 mL/min (ref >60)
Glucose, Bld: 39 mg/dL — CL (ref 70–99)
Potassium: 4.5 mmol/L (ref 3.5–5.1)
Sodium: 133 mmol/L — ABNORMAL LOW (ref 135–145)
Total Bilirubin: 0.7 mg/dL (ref 0.3–1.2)
Total Protein: 5.8 g/dL — ABNORMAL LOW (ref 6.5–8.1)

## 2019-02-02 LAB — CBC WITH DIFFERENTIAL/PLATELET
Abs Immature Granulocytes: 1.13 10*3/uL — ABNORMAL HIGH (ref 0.00–0.07)
Basophils Absolute: 0.1 10*3/uL (ref 0.0–0.1)
Basophils Relative: 1 %
Eosinophils Absolute: 0.3 10*3/uL (ref 0.0–0.5)
Eosinophils Relative: 1 %
HCT: 45.4 % (ref 39.0–52.0)
Hemoglobin: 15.1 g/dL (ref 13.0–17.0)
Immature Granulocytes: 5 %
Lymphocytes Relative: 11 %
Lymphs Abs: 2.6 10*3/uL (ref 0.7–4.0)
MCH: 26.8 pg (ref 26.0–34.0)
MCHC: 33.3 g/dL (ref 30.0–36.0)
MCV: 80.5 fL (ref 80.0–100.0)
Monocytes Absolute: 2.3 10*3/uL — ABNORMAL HIGH (ref 0.1–1.0)
Monocytes Relative: 10 %
Neutro Abs: 16.5 10*3/uL — ABNORMAL HIGH (ref 1.7–7.7)
Neutrophils Relative %: 72 %
Platelets: 576 10*3/uL — ABNORMAL HIGH (ref 150–400)
RBC: 5.64 MIL/uL (ref 4.22–5.81)
RDW: 12.1 % (ref 11.5–15.5)
WBC: 22.9 10*3/uL — ABNORMAL HIGH (ref 4.0–10.5)
nRBC: 0 % (ref 0.0–0.2)

## 2019-02-02 LAB — D-DIMER, QUANTITATIVE: D-Dimer, Quant: 2.22 ug/mL-FEU — ABNORMAL HIGH (ref 0.00–0.50)

## 2019-02-02 LAB — C-REACTIVE PROTEIN: CRP: <0.8 mg/dL (ref <1.0)

## 2019-02-02 LAB — MAGNESIUM: Magnesium: 2.1 mg/dL (ref 1.7–2.4)

## 2019-02-02 LAB — BRAIN NATRIURETIC PEPTIDE: B Natriuretic Peptide: 25.4 pg/mL (ref 0.0–100.0)

## 2019-02-02 MED ORDER — INSULIN ASPART 100 UNIT/ML ~~LOC~~ SOLN
0.0000 [IU] | Freq: Three times a day (TID) | SUBCUTANEOUS | Status: DC
  Administered 2019-02-02: 3 [IU] via SUBCUTANEOUS
  Administered 2019-02-02: 5 [IU] via SUBCUTANEOUS
  Administered 2019-02-03: 16:00:00 9 [IU] via SUBCUTANEOUS
  Administered 2019-02-03: 5 [IU] via SUBCUTANEOUS
  Administered 2019-02-04: 9 [IU] via SUBCUTANEOUS
  Administered 2019-02-05: 17:00:00 2 [IU] via SUBCUTANEOUS
  Administered 2019-02-06: 9 [IU] via SUBCUTANEOUS
  Administered 2019-02-06: 5 [IU] via SUBCUTANEOUS
  Administered 2019-02-07: 7 [IU] via SUBCUTANEOUS
  Administered 2019-02-07: 08:00:00 5 [IU] via SUBCUTANEOUS

## 2019-02-02 MED ORDER — METHYLPREDNISOLONE SODIUM SUCC 40 MG IJ SOLR
20.0000 mg | Freq: Every day | INTRAMUSCULAR | Status: DC
  Administered 2019-02-02: 20 mg via INTRAVENOUS
  Filled 2019-02-02: qty 1

## 2019-02-02 MED ORDER — DEXTROSE 50 % IV SOLN
25.0000 mL | Freq: Once | INTRAVENOUS | Status: AC
  Administered 2019-02-02: 05:00:00 25 mL via INTRAVENOUS

## 2019-02-02 MED ORDER — INSULIN DETEMIR 100 UNIT/ML ~~LOC~~ SOLN: 25.0000 [IU] | Freq: Two times a day (BID) | SUBCUTANEOUS | Status: DC

## 2019-02-02 MED ORDER — INSULIN DETEMIR 100 UNIT/ML ~~LOC~~ SOLN: 28.0000 [IU] | Freq: Two times a day (BID) | SUBCUTANEOUS | Status: DC

## 2019-02-02 MED ORDER — INSULIN DETEMIR 100 UNIT/ML ~~LOC~~ SOLN
20.0000 [IU] | Freq: Two times a day (BID) | SUBCUTANEOUS | Status: DC
  Administered 2019-02-02 – 2019-02-06 (×10): 20 [IU] via SUBCUTANEOUS
  Filled 2019-02-02 (×10): qty 0.2

## 2019-02-02 MED ORDER — INSULIN ASPART 100 UNIT/ML ~~LOC~~ SOLN
0.0000 [IU] | Freq: Every day | SUBCUTANEOUS | Status: DC
  Administered 2019-02-02 – 2019-02-03 (×2): 5 [IU] via SUBCUTANEOUS
  Administered 2019-02-04 – 2019-02-05 (×2): 3 [IU] via SUBCUTANEOUS
  Administered 2019-02-06: 21:00:00 5 [IU] via SUBCUTANEOUS

## 2019-02-02 MED ORDER — DEXTROSE 50 % IV SOLN
INTRAVENOUS | Status: AC
  Administered 2019-02-02: 25 mL via INTRAVENOUS
  Filled 2019-02-02: qty 50

## 2019-02-02 NOTE — Progress Notes (Signed)
PROGRESS NOTE                                                                                                                                                                                                             Patient Demographics:    Nathan Perkins, is a 63 y.o. male, DOB - 03-20-1956, XIP:[id number]  Outpatient Primary MD for the patient is Monico Blitz, MD   Admit date - 01/24/2019   LOS - 8  Chief Complaint  Patient presents with  . Shortness of Breath       Brief Narrative: Patient is a 63 y.o. male with PMHx of insulin-dependent DM-2, HTN presented with 1 week history of cough, fever, myalgias and worsening shortness of breath.  Patient was found to have acute hypoxic respiratory failure in setting of COVID-19 pneumonia at [facility] course was complicated by rapidly worsening respiratory failure in spite of being treated with steroids, remdesivir and Actemra.  See below for further details.   Subjective:   Patient in bed, appears comfortable, denies any headache, no fever, no chest pain or pressure, no shortness of breath , no abdominal pain. No focal weakness.   Assessment  & Plan :   Acute Hypoxic Resp Failure due to Covid 19 Viral pneumonia: he has severe COVID-19 pneumonitis and acute hypoxic respiratory failure, now on 2-3 lit [place] oxygen down from 15 L high flow nasal cannula oxygen, he is now in no distress but remains a little anxious.   He has so far received full treatment including IV steroids, remdesivir, Actemra, convalescent plasma.  His inflammatory markers are finally trending down and he appears to have stabilized except for some ongoing anxiety.  Need to taper down steroids.  Prepare for discharge in the next 1 to 2 days.  He and his wife has been told that he might go home on a low-dose oxygen if needed.     COVID-19 Labs:  Recent Labs    01/31/19 0133 02/01/19 0135  02/02/19 0200  DDIMER 1.33* 1.10* 2.22*  CRP <0.8 <0.8 <0.8    Lab Results  Component Value Date   SARSCOV2NAA POSITIVE (A) 01/24/2019    Hepatic Function Latest Ref Rng & Units 02/02/2019 02/01/2019 01/31/2019  Total Protein 6.5 - 8.1 g/dL 5.8(L) 5.6(L) 6.1(L)  Albumin 3.5 - 5.0 g/dL 3.0(L) 3.0(L) 3.3(L)  AST 15 - 41 U/L '22 28 31  ' ALT 0 - 44 U/L 57(H) 73(H) 81(H)  Alk Phosphatase 38 - 126 U/L 46 43 46  Total Bilirubin 0.3 - 1.2 mg/dL 0.7 0.4 0.8    COVID-19 Medications:  Steroids: 10/20>> Remdesivir: 10/21>>10/26 Actemra:10/21 x 1 Convalescent Plasma: 10/22   Transaminitis.  Likely due to combination of COVID-19 infection and Remdesivir.  Finishing remdesivir soon, symptom-free will monitor trend.    HTN: BP relatively controlled-continue amlodipine and follow.  Extreme anxiety.  Added Klonopin twice daily will monitor.  Hold for sedation.  Leukocytosis - likely steroid related will monitor closely.  No productive cough, no dysuria, no fever.    Insulin-dependent DM-2: Became hypoglycemic on 02/02/2019, did insulin dose adjusted with holding instructions, drop likely due to tapering of steroids, will monitor closely.  Lab Results  Component Value Date   HGBA1C 8.6 (H) 01/24/2019   CBG (last 3)  Recent Labs    02/01/19 1637 02/01/19 2034 02/02/19 0741  GLUCAP 275* 320* 62*     Condition -  Fair  Family Communication  : Discussed with wife on 01/28/2019, 01/31/19.  Code Status :  Full Code  Diet :  Diet Order            Diet Carb Modified Fluid consistency: Thin; Room service appropriate? Yes  Diet effective now               Disposition Plan  : Discharge in the next 1 to 2 days depending on progress  Barriers to discharge: Hypoxemia  Consults  :  None  Procedures  :  None  Antibiotics  :    Anti-infectives (From admission, onward)   Start     Dose/Rate Route Frequency Ordered Stop   01/26/19 1600  remdesivir 100 mg in sodium chloride 0.9  % 250 mL IVPB     100 mg 500 mL/hr over 30 Minutes Intravenous Every 24 hours 01/25/19 1445 01/29/19 1720   01/25/19 1600  remdesivir 200 mg in sodium chloride 0.9 % 250 mL IVPB     200 mg 500 mL/hr over 30 Minutes Intravenous Once 01/25/19 1445 01/25/19 1727      Inpatient Medications  Scheduled Meds: . albuterol  2 puff Inhalation Q6H  . amLODipine  5 mg Oral Daily  . aspirin  81 mg Oral Daily  . benzonatate  200 mg Oral TID  . clonazepam  0.5 mg Oral BID  . guaiFENesin  600 mg Oral BID  . insulin aspart  0-5 Units Subcutaneous QHS  . insulin aspart  0-9 Units Subcutaneous TID WC  . insulin detemir  20 Units Subcutaneous BID  . methylPREDNISolone (SOLU-MEDROL) injection  20 mg Intravenous Daily  . pantoprazole  40 mg Oral Daily  . vitamin C  1,000 mg Oral Daily  . zinc sulfate  220 mg Oral Daily   Continuous Infusions:  PRN Meds:.chlorpheniramine-HYDROcodone, labetalol, loperamide, [DISCONTINUED] ondansetron **OR** ondansetron (ZOFRAN) IV, sodium chloride, traMADol   Time Spent in minutes  35  See all Orders from today for further details   Lala Lund M.D on 02/02/2019 at 9:51 AM  To page go to [email or web address] - use universal password  [facility] -  Office  [phone]    Objective:   Vitals:   02/01/19 1919 02/02/19 0015 02/02/19 0355 02/02/19 0746  BP: 136/77 140/86 126/82 125/83  Pulse: 85 76 70 73  Resp: '18  18 16  ' Temp: 97.7 F (36.5 C) 98 F (36.7 C)  98.4 F (36.9 C) 98.4 F (36.9 C)  TempSrc: Oral Axillary Oral Oral  SpO2: 91% 90% 90% 90%  Weight:      Height:        Wt Readings from Last 3 Encounters:  01/25/19 90.1 kg  07/30/17 90.7 kg  01/07/15 92.5 kg     Intake/Output Summary (Last 24 hours) at 02/02/2019 0951 Last data filed at 02/02/2019 0923 Gross per 24 hour  Intake 1154.06 ml  Output 3550 ml  Net -2395.94 ml     Physical Exam  Awake Alert,  No new F.N deficits, Normal affect [place].AT,PERRAL Supple Neck,No JVD,  No cervical lymphadenopathy appriciated.  Symmetrical Chest wall movement, Good air movement bilaterally, CTAB RRR,No Gallops, Rubs or new Murmurs, No Parasternal Heave +ve B.Sounds, Abd Soft, No tenderness, No organomegaly appriciated, No rebound - guarding or rigidity. No Cyanosis, Clubbing or edema, No new Rash or bruise     Data Review:    CBC Recent Labs  Lab 01/29/19 0603 01/30/19 0525 01/31/19 0133 02/01/19 0135 02/02/19 0200  WBC 10.1 10.4 10.8* 13.2* 22.9*  HGB 14.7 11.1* 14.4 14.3 15.1  HCT 44.6 32.9* 43.0 43.1 45.4  PLT 316 243 356 412* 576*  MCV 82.4 82.3 81.6 81.0 80.5  MCH 27.2 27.8 27.3 26.9 26.8  MCHC 33.0 33.7 33.5 33.2 33.3  RDW 12.2 11.9 11.9 11.9 12.1  LYMPHSABS 0.6* 0.6* 0.4* 0.5* 2.6  MONOABS 0.8 1.0 0.4 0.8 2.3*  EOSABS 0.0 0.0 0.0 0.0 0.3  BASOSABS 0.0 0.0 0.0 0.0 0.1    Chemistries  Recent Labs  Lab 01/29/19 0603 01/30/19 0525 01/31/19 0133 02/01/19 0135 02/02/19 0200  NA 142 140 136 134* 133*  K 4.7 4.7 4.8 4.9 4.5  CL 102 101 99 100 99  CO2 '30 30 30 23 27  ' GLUCOSE 118* 107* 159* 81 39*  BUN 31* 29* 30* 30* 26*  CREATININE 0.79 0.81 0.90 0.83 0.78  CALCIUM 8.5* 8.6* 8.4* 8.6* 8.6*  MG 2.6* 2.4 2.4 2.3 2.1  AST 147* 40 '31 28 22  ' ALT 129* 94* 81* 73* 57*  ALKPHOS 52 46 46 43 46  BILITOT 0.8 0.4 0.8 0.4 0.7   ------------------------------------------------------------------------------------------------------------------ No results for input(s): CHOL, HDL, LDLCALC, TRIG, CHOLHDL, LDLDIRECT in the last 72 hours.  Lab Results  Component Value Date   HGBA1C 8.6 (H) 01/24/2019   ------------------------------------------------------------------------------------------------------------------ No results for input(s): TSH, T4TOTAL, T3FREE, THYROIDAB in the last 72 hours.  Invalid input(s): FREET3 ------------------------------------------------------------------------------------------------------------------ No results for  input(s): VITAMINB12, FOLATE, FERRITIN, TIBC, IRON, RETICCTPCT in the last 72 hours.  Coagulation profile No results for input(s): INR, PROTIME in the last 168 hours.  Recent Labs    02/01/19 0135 02/02/19 0200  DDIMER 1.10* 2.22*    Cardiac Enzymes No results for input(s): CKMB, TROPONINI, MYOGLOBIN in the last 168 hours.  Invalid input(s): CK ------------------------------------------------------------------------------------------------------------------    Component Value Date/Time   BNP 25.4 02/02/2019 0200    Micro Results Recent Results (from the past 240 hour(s))  Blood Culture (routine x 2)     Status: None   Collection Time: 01/24/19  9:36 PM   Specimen: Left Antecubital; Blood  Result Value Ref Range Status   Specimen Description LEFT ANTECUBITAL  Final   Special Requests   Final    BOTTLES DRAWN AEROBIC AND ANAEROBIC Blood Culture adequate volume   Culture   Final    NO GROWTH 5 DAYS Performed at [facility], [address] [place], [place] [address]  Report Status 01/29/2019 FINAL  Final  Blood Culture (routine x 2)     Status: None   Collection Time: 01/24/19 10:44 PM   Specimen: BLOOD LEFT HAND  Result Value Ref Range Status   Specimen Description BLOOD LEFT HAND  Final   Special Requests   Final    BOTTLES DRAWN AEROBIC AND ANAEROBIC Blood Culture results may not be optimal due to an excessive volume of blood received in culture bottles   Culture   Final    NO GROWTH 5 DAYS Performed at [facility], [address] [place], [place] [address]    Report Status 01/30/2019 FINAL  Final  SARS Coronavirus 2 by RT PCR (hospital order, performed in [place] hospital lab) Nasopharyngeal Nasopharyngeal Swab     Status: Abnormal   Collection Time: 01/24/19 10:45 PM   Specimen: Nasopharyngeal Swab  Result Value Ref Range Status   SARS Coronavirus 2 POSITIVE (A) NEGATIVE Final    Comment: RESULT CALLED TO, READ BACK BY AND VERIFIED WITH: L ANDREW,RN  '@2345'  01/24/19 MKELLY (NOTE) If result is NEGATIVE SARS-CoV-2 target nucleic acids are NOT DETECTED. The SARS-CoV-2 RNA is generally detectable in upper and lower  respiratory specimens during the acute phase of infection. The lowest  concentration of SARS-CoV-2 viral copies this assay can detect is 250  copies / mL. A negative result does not preclude SARS-CoV-2 infection  and should not be used as the sole basis for treatment or other  patient management decisions.  A negative result may occur with  improper specimen collection / handling, submission of specimen other  than nasopharyngeal swab, presence of viral mutation(s) within the  areas targeted by this assay, and inadequate number of viral copies  (<250 copies / mL). A negative result must be combined with clinical  observations, patient history, and epidemiological information. If result is POSITIVE SARS-CoV-2 target nucleic acids are DETECTED. The  SARS-CoV-2 RNA is generally detectable in upper and lower  respiratory specimens during the acute phase of infection.  Positive  results are indicative of active infection with SARS-CoV-2.  Clinical  correlation with patient history and other diagnostic information is  necessary to determine patient infection status.  Positive results do  not rule out bacterial infection or co-infection with other viruses. If result is PRESUMPTIVE POSTIVE SARS-CoV-2 nucleic acids MAY BE PRESENT.   A presumptive positive result was obtained on the submitted specimen  and confirmed on repeat testing.  While 2019 novel coronavirus  (SARS-CoV-2) nucleic acids may be present in the submitted sample  additional confirmatory testing may be necessary for epidemiological  and / or clinical management purposes  to differentiate between  SARS-CoV-2 and other Sarbecovirus currently known to infect humans.  If clinically indicated additional testing with an alternate test  methodology [phone]) is a dvised.  The SARS-CoV-2 RNA is generally  detectable in upper and lower respiratory specimens during the acute  phase of infection. The expected result is Negative. Fact Sheet for Patients:  StrictlyIdeas.no Fact Sheet for Healthcare Providers: BankingDealers.co.za This test is not yet approved or cleared by the [place] FDA and has been authorized for detection and/or diagnosis of SARS-CoV-2 by FDA under an Emergency Use Authorization (EUA).  This EUA will remain in effect (meaning this test can be used) for the duration of the COVID-19 declaration under Section 564(b)(1) of the Act, 21 U.S.C. section 360bbb-3(b)(1), unless the authorization is terminated or revoked sooner. Performed at [facility], [address] [place], [place] [address]  Radiology Reports Dg Chest Port 1 View  Result Date: 01/24/2019 CLINICAL DATA:  Difficulty breathing, concern for COVID-19 EXAM: PORTABLE CHEST 1 VIEW COMPARISON:  Radiograph 01/23/2019 FINDINGS: Worsening bilateral interstitial and airspace opacities throughout both lungs. Peripheral predominance most notable in the left lung base. Lung volumes are diminished with streaky areas of atelectasis as well. No pneumothorax or visible effusion. Cardiomediastinal contours are stable accounting for differences in technique and lung volumes. Degenerative changes are present in the imaged spine and shoulders. Cervical spinal fusion hardware is incompletely assessed. IMPRESSION: Worsening bilateral interstitial and airspace opacities throughout both lungs, compatible with a multifocal pneumonia such as atypical viral pneumonia including COVID-19. Electronically Signed   By: Lovena Le M.D.   On: 01/24/2019 22:17   Dg Chest Port 1v Same Day  Result Date: 01/26/2019 CLINICAL DATA:  Shortness of breath EXAM: PORTABLE CHEST 1 VIEW COMPARISON:  01/24/2019 FINDINGS: Heart is borderline enlarged. There diffuse bilateral  airspace opacities noted with some peripheral predominance. This is worsened since prior study. Low lung volumes. No visible effusions. IMPRESSION: Cardiomegaly. Patchy bilateral airspace opacities with peripheral predominance. This is worsened since prior study concerning for multifocal pneumonia. Electronically Signed   By: Rolm Baptise M.D.   On: 01/26/2019 09:14

## 2019-02-03 LAB — MAGNESIUM: Magnesium: 2 mg/dL (ref 1.7–2.4)

## 2019-02-03 LAB — COMPREHENSIVE METABOLIC PANEL
ALT: 45 U/L — ABNORMAL HIGH (ref 0–44)
AST: 17 U/L (ref 15–41)
Albumin: 2.8 g/dL — ABNORMAL LOW (ref 3.5–5.0)
Alkaline Phosphatase: 49 U/L (ref 38–126)
Anion gap: 10 (ref 5–15)
BUN: 22 mg/dL (ref 8–23)
CO2: 26 mmol/L (ref 22–32)
Calcium: 8.5 mg/dL — ABNORMAL LOW (ref 8.9–10.3)
Chloride: 97 mmol/L — ABNORMAL LOW (ref 98–111)
Creatinine, Ser: 0.76 mg/dL (ref 0.61–1.24)
GFR calc Af Amer: >60 mL/min (ref >60)
GFR calc non Af Amer: >60 mL/min (ref >60)
Glucose, Bld: 167 mg/dL — ABNORMAL HIGH (ref 70–99)
Potassium: 4.4 mmol/L (ref 3.5–5.1)
Sodium: 133 mmol/L — ABNORMAL LOW (ref 135–145)
Total Bilirubin: 0.7 mg/dL (ref 0.3–1.2)
Total Protein: 5.4 g/dL — ABNORMAL LOW (ref 6.5–8.1)

## 2019-02-03 LAB — GLUCOSE, CAPILLARY
Glucose-Capillary: 47 mg/dL — ABNORMAL LOW (ref 70–99)
Glucose-Capillary: 71 mg/dL (ref 70–99)
Glucose-Capillary: 251 mg/dL — ABNORMAL HIGH (ref 70–99)
Glucose-Capillary: 392 mg/dL — ABNORMAL HIGH (ref 70–99)
Glucose-Capillary: 381 mg/dL — ABNORMAL HIGH (ref 70–99)

## 2019-02-03 LAB — CBC WITH DIFFERENTIAL/PLATELET
Abs Immature Granulocytes: 0.54 10*3/uL — ABNORMAL HIGH (ref 0.00–0.07)
Basophils Absolute: 0.1 10*3/uL (ref 0.0–0.1)
Basophils Relative: 0 %
Eosinophils Absolute: 0.3 10*3/uL (ref 0.0–0.5)
Eosinophils Relative: 2 %
HCT: 44.4 % (ref 39.0–52.0)
Hemoglobin: 14.7 g/dL (ref 13.0–17.0)
Immature Granulocytes: 4 %
Lymphocytes Relative: 7 %
Lymphs Abs: 1.1 10*3/uL (ref 0.7–4.0)
MCH: 26.9 pg (ref 26.0–34.0)
MCHC: 33.1 g/dL (ref 30.0–36.0)
MCV: 81.3 fL (ref 80.0–100.0)
Monocytes Absolute: 1.1 10*3/uL — ABNORMAL HIGH (ref 0.1–1.0)
Monocytes Relative: 8 %
Neutro Abs: 11.6 10*3/uL — ABNORMAL HIGH (ref 1.7–7.7)
Neutrophils Relative %: 79 %
Platelets: 393 10*3/uL (ref 150–400)
RBC: 5.46 MIL/uL (ref 4.22–5.81)
RDW: 12.3 % (ref 11.5–15.5)
WBC: 14.7 10*3/uL — ABNORMAL HIGH (ref 4.0–10.5)
nRBC: 0 % (ref 0.0–0.2)

## 2019-02-03 LAB — BRAIN NATRIURETIC PEPTIDE: B Natriuretic Peptide: 14.8 pg/mL (ref 0.0–100.0)

## 2019-02-03 LAB — C-REACTIVE PROTEIN: CRP: 1 mg/dL — ABNORMAL HIGH (ref <1.0)

## 2019-02-03 LAB — D-DIMER, QUANTITATIVE: D-Dimer, Quant: 6.95 ug/mL-FEU — ABNORMAL HIGH (ref 0.00–0.50)

## 2019-02-03 MED ORDER — METHYLPREDNISOLONE SODIUM SUCC 40 MG IJ SOLR
10.0000 mg | Freq: Every day | INTRAMUSCULAR | Status: DC
  Administered 2019-02-03: 10 mg via INTRAVENOUS
  Filled 2019-02-03: qty 1

## 2019-02-03 MED ORDER — METHYLPREDNISOLONE SODIUM SUCC 40 MG IJ SOLR
20.0000 mg | Freq: Every day | INTRAMUSCULAR | Status: DC
  Administered 2019-02-04 – 2019-02-06 (×3): 20 mg via INTRAVENOUS
  Filled 2019-02-03 (×3): qty 1

## 2019-02-03 MED ORDER — METHYLPREDNISOLONE SODIUM SUCC 40 MG IJ SOLR: 40.0000 mg | Freq: Every day | INTRAMUSCULAR | Status: DC

## 2019-02-03 MED ORDER — INSULIN ASPART 100 UNIT/ML ~~LOC~~ SOLN
3.0000 [IU] | Freq: Three times a day (TID) | SUBCUTANEOUS | Status: AC
  Administered 2019-02-03 (×2): 3 [IU] via SUBCUTANEOUS

## 2019-02-03 NOTE — Progress Notes (Signed)
PROGRESS NOTE                                                                                                                                                                                                             Patient Demographics:    Nathan Perkins, is a 63 y.o. male, DOB - 06/08/1955, EXB:[id number]  Outpatient Primary MD for the patient is Monico Blitz, MD   Admit date - 01/24/2019   LOS - 9  Chief Complaint  Patient presents with  . Shortness of Breath       Brief Narrative: Patient is a 63 y.o. male with PMHx of insulin-dependent DM-2, HTN presented with 1 week history of cough, fever, myalgias and worsening shortness of breath.  Patient was found to have acute hypoxic respiratory failure in setting of COVID-19 pneumonia at [facility] was complicated by rapidly worsening respiratory failure in spite of being treated with steroids, remdesivir and Actemra.  See below for further details.   Subjective:   Patient in bed, appears comfortable, denies any headache, no fever, no chest pain or pressure, no shortness of breath , no abdominal pain. No focal weakness.   Assessment  & Plan :   Acute Hypoxic Resp Failure due to Covid 19 Viral pneumonia: he has severe COVID-19 pneumonitis and acute hypoxic respiratory failure, now on 2-3 lit [place] oxygen down from 15 L high flow nasal cannula oxygen, he is now in no distress but remains a little anxious.   He has so far received full treatment including IV steroids, remdesivir, Actemra, convalescent plasma.  His inflammatory markers are finally trending down and he appears to have stabilized except for some ongoing anxiety.  Need to taper down steroids.  Prepare for discharge in the next 1 to 2 days.  He and his wife has been told that he might go home on a low-dose oxygen if needed.     COVID-19 Labs:  Recent Labs    02/01/19 0135 02/02/19 0200  02/03/19 0050  DDIMER 1.10* 2.22* 6.95*  CRP <0.8 <0.8 1.0*    Lab Results  Component Value Date   SARSCOV2NAA POSITIVE (A) 01/24/2019    Hepatic Function Latest Ref Rng & Units 02/03/2019 02/02/2019 02/01/2019  Total Protein 6.5 - 8.1 g/dL 5.4(L) 5.8(L) 5.6(L)  Albumin 3.5 - 5.0 g/dL 2.8(L) 3.0(L) 3.0(L)  AST 15 - 41 U/L '17 22 28  ' ALT 0 - 44 U/L 45(H) 57(H) 73(H)  Alk Phosphatase 38 - 126 U/L 49 46 43  Total Bilirubin 0.3 - 1.2 mg/dL 0.7 0.7 0.4    COVID-19 Medications:  Steroids: 10/20>> Remdesivir: 10/21>>10/26 Actemra:10/21 x 1 Convalescent Plasma: 10/22   Transaminitis.  Likely due to combination of COVID-19 infection and Remdesivir.  Finishing remdesivir soon, symptom-free will monitor trend.    HTN: BP relatively controlled-continue amlodipine and follow.  Extreme anxiety.  Added Klonopin twice daily will monitor.  Hold for sedation.  Leukocytosis - likely steroid related will monitor closely.  No productive cough, no dysuria, no fever.    Insulin-dependent DM-2: Became hypoglycemic on 02/02/2019, did insulin dose adjusted with holding instructions, drop likely due to tapering of steroids, will monitor closely.  Lab Results  Component Value Date   HGBA1C 8.6 (H) 01/24/2019   CBG (last 3)  Recent Labs    02/03/19 0731 02/03/19 0805 02/03/19 1133  GLUCAP 69* 71 251*     Condition -  Fair  Family Communication  : Discussed with wife on 01/28/2019, 01/31/19.  Code Status :  Full Code  Diet :  Diet Order            Diet Carb Modified Fluid consistency: Thin; Room service appropriate? Yes  Diet effective now               Disposition Plan  : Discharge in the next 1 to 2 days depending on progress  Barriers to discharge: Hypoxemia  Consults  :  None  Procedures  :  None  Antibiotics  :    Anti-infectives (From admission, onward)   Start     Dose/Rate Route Frequency Ordered Stop   01/26/19 1600  remdesivir 100 mg in sodium chloride 0.9 %  250 mL IVPB     100 mg 500 mL/hr over 30 Minutes Intravenous Every 24 hours 01/25/19 1445 01/29/19 1720   01/25/19 1600  remdesivir 200 mg in sodium chloride 0.9 % 250 mL IVPB     200 mg 500 mL/hr over 30 Minutes Intravenous Once 01/25/19 1445 01/25/19 1727      Inpatient Medications  Scheduled Meds: . albuterol  2 puff Inhalation Q6H  . amLODipine  5 mg Oral Daily  . aspirin  81 mg Oral Daily  . benzonatate  200 mg Oral TID  . clonazepam  0.5 mg Oral BID  . guaiFENesin  600 mg Oral BID  . insulin aspart  0-5 Units Subcutaneous QHS  . insulin aspart  0-9 Units Subcutaneous TID WC  . insulin detemir  20 Units Subcutaneous BID  . [START ON 02/04/2019] methylPREDNISolone (SOLU-MEDROL) injection  40 mg Intravenous Daily  . pantoprazole  40 mg Oral Daily  . vitamin C  1,000 mg Oral Daily  . zinc sulfate  220 mg Oral Daily   Continuous Infusions:  PRN Meds:.labetalol, [DISCONTINUED] ondansetron **OR** ondansetron (ZOFRAN) IV, sodium chloride, traMADol   Time Spent in minutes  35  See all Orders from today for further details   Lala Lund M.D on 02/03/2019 at 11:48 AM  To page go to [email or web address] - use universal password  [facility] -  Office  [phone]    Objective:   Vitals:   02/03/19 0304 02/03/19 0733 02/03/19 0841 02/03/19 1136  BP: 113/76 (!) 144/93    Pulse: 78 97  91  Resp: 18 18    Temp: 97.9 F (36.6 C) 97.8 F (  36.6 C)    TempSrc: Oral Oral    SpO2: 90% (!) 88% (!) 87% 95%  Weight:      Height:        Wt Readings from Last 3 Encounters:  01/25/19 90.1 kg  07/30/17 90.7 kg  01/07/15 92.5 kg     Intake/Output Summary (Last 24 hours) at 02/03/2019 1148 Last data filed at 02/03/2019 1100 Gross per 24 hour  Intake 360 ml  Output 1700 ml  Net -1340 ml     Physical Exam  Awake Alert, Oriented X 3, No new F.N deficits, Normal affect [place].AT,PERRAL Supple Neck,No JVD, No cervical lymphadenopathy appriciated.  Symmetrical Chest  wall movement, Good air movement bilaterally, CTAB RRR,No Gallops, Rubs or new Murmurs, No Parasternal Heave +ve B.Sounds, Abd Soft, No tenderness, No organomegaly appriciated, No rebound - guarding or rigidity. No Cyanosis, Clubbing or edema, No new Rash or bruise    Data Review:    CBC Recent Labs  Lab 01/30/19 0525 01/31/19 0133 02/01/19 0135 02/02/19 0200 02/03/19 0050  WBC 10.4 10.8* 13.2* 22.9* 14.7*  HGB 11.1* 14.4 14.3 15.1 14.7  HCT 32.9* 43.0 43.1 45.4 44.4  PLT 243 356 412* 576* 393  MCV 82.3 81.6 81.0 80.5 81.3  MCH 27.8 27.3 26.9 26.8 26.9  MCHC 33.7 33.5 33.2 33.3 33.1  RDW 11.9 11.9 11.9 12.1 12.3  LYMPHSABS 0.6* 0.4* 0.5* 2.6 1.1  MONOABS 1.0 0.4 0.8 2.3* 1.1*  EOSABS 0.0 0.0 0.0 0.3 0.3  BASOSABS 0.0 0.0 0.0 0.1 0.1    Chemistries  Recent Labs  Lab 01/30/19 0525 01/31/19 0133 02/01/19 0135 02/02/19 0200 02/03/19 0050  NA 140 136 134* 133* 133*  K 4.7 4.8 4.9 4.5 4.4  CL 101 99 100 99 97*  CO2 '30 30 23 27 26  ' GLUCOSE 107* 159* 81 39* 167*  BUN 29* 30* 30* 26* 22  CREATININE 0.81 0.90 0.83 0.78 0.76  CALCIUM 8.6* 8.4* 8.6* 8.6* 8.5*  MG 2.4 2.4 2.3 2.1 2.0  AST 40 '31 28 22 17  ' ALT 94* 81* 73* 57* 45*  ALKPHOS 46 46 43 46 49  BILITOT 0.4 0.8 0.4 0.7 0.7   ------------------------------------------------------------------------------------------------------------------ No results for input(s): CHOL, HDL, LDLCALC, TRIG, CHOLHDL, LDLDIRECT in the last 72 hours.  Lab Results  Component Value Date   HGBA1C 8.6 (H) 01/24/2019   ------------------------------------------------------------------------------------------------------------------ No results for input(s): TSH, T4TOTAL, T3FREE, THYROIDAB in the last 72 hours.  Invalid input(s): FREET3 ------------------------------------------------------------------------------------------------------------------ No results for input(s): VITAMINB12, FOLATE, FERRITIN, TIBC, IRON, RETICCTPCT in the last  72 hours.  Coagulation profile No results for input(s): INR, PROTIME in the last 168 hours.  Recent Labs    02/02/19 0200 02/03/19 0050  DDIMER 2.22* 6.95*    Cardiac Enzymes No results for input(s): CKMB, TROPONINI, MYOGLOBIN in the last 168 hours.  Invalid input(s): CK ------------------------------------------------------------------------------------------------------------------    Component Value Date/Time   BNP 14.8 02/03/2019 0050    Micro Results Recent Results (from the past 240 hour(s))  Blood Culture (routine x 2)     Status: None   Collection Time: 01/24/19  9:36 PM   Specimen: Left Antecubital; Blood  Result Value Ref Range Status   Specimen Description LEFT ANTECUBITAL  Final   Special Requests   Final    BOTTLES DRAWN AEROBIC AND ANAEROBIC Blood Culture adequate volume   Culture   Final    NO GROWTH 5 DAYS Performed at [facility], [address], [place], [place] [address]  Report Status 01/29/2019 FINAL  Final  Blood Culture (routine x 2)     Status: None   Collection Time: 01/24/19 10:44 PM   Specimen: BLOOD LEFT HAND  Result Value Ref Range Status   Specimen Description BLOOD LEFT HAND  Final   Special Requests   Final    BOTTLES DRAWN AEROBIC AND ANAEROBIC Blood Culture results may not be optimal due to an excessive volume of blood received in culture bottles   Culture   Final    NO GROWTH 5 DAYS Performed at [facility], [address], [place], [place] [address]    Report Status 01/30/2019 FINAL  Final  SARS Coronavirus 2 by RT PCR (hospital order, performed in [place] hospital lab) Nasopharyngeal Nasopharyngeal Swab     Status: Abnormal   Collection Time: 01/24/19 10:45 PM   Specimen: Nasopharyngeal Swab  Result Value Ref Range Status   SARS Coronavirus 2 POSITIVE (A) NEGATIVE Final    Comment: RESULT CALLED TO, READ BACK BY AND VERIFIED WITH: L ANDREW,RN '@[id number]'  01/24/19 MKELLY (NOTE) If result is NEGATIVE SARS-CoV-2 target nucleic  acids are NOT DETECTED. The SARS-CoV-2 RNA is generally detectable in upper and lower  respiratory specimens during the acute phase of infection. The lowest  concentration of SARS-CoV-2 viral copies this assay can detect is 250  copies / mL. A negative result does not preclude SARS-CoV-2 infection  and should not be used as the sole basis for treatment or other  patient management decisions.  A negative result may occur with  improper specimen collection / handling, submission of specimen other  than nasopharyngeal swab, presence of viral mutation(s) within the  areas targeted by this assay, and inadequate number of viral copies  (<250 copies / mL). A negative result must be combined with clinical  observations, patient history, and epidemiological information. If result is POSITIVE SARS-CoV-2 target nucleic acids are DETECTED. The  SARS-CoV-2 RNA is generally detectable in upper and lower  respiratory specimens during the acute phase of infection.  Positive  results are indicative of active infection with SARS-CoV-2.  Clinical  correlation with patient history and other diagnostic information is  necessary to determine patient infection status.  Positive results do  not rule out bacterial infection or co-infection with other viruses. If result is PRESUMPTIVE POSTIVE SARS-CoV-2 nucleic acids MAY BE PRESENT.   A presumptive positive result was obtained on the submitted specimen  and confirmed on repeat testing.  While 2019 novel coronavirus  (SARS-CoV-2) nucleic acids may be present in the submitted sample  additional confirmatory testing may be necessary for epidemiological  and / or clinical management purposes  to differentiate between  SARS-CoV-2 and other Sarbecovirus currently known to infect humans.  If clinically indicated additional testing with an alternate test  methodology [phone]) is a dvised. The SARS-CoV-2 RNA is generally  detectable in upper and lower respiratory  specimens during the acute  phase of infection. The expected result is Negative. Fact Sheet for Patients:  StrictlyIdeas.no Fact Sheet for Healthcare Providers: BankingDealers.co.za This test is not yet approved or cleared by the [place] FDA and has been authorized for detection and/or diagnosis of SARS-CoV-2 by FDA under an Emergency Use Authorization (EUA).  This EUA will remain in effect (meaning this test can be used) for the duration of the COVID-19 declaration under Section 564(b)(1) of the Act, 21 U.S.C. section 360bbb-3(b)(1), unless the authorization is terminated or revoked sooner. Performed at [facility], [address] [place], [place] [address]  Radiology Reports Dg Chest Port 1 View  Result Date: 01/24/2019 CLINICAL DATA:  Difficulty breathing, concern for COVID-19 EXAM: PORTABLE CHEST 1 VIEW COMPARISON:  Radiograph 01/23/2019 FINDINGS: Worsening bilateral interstitial and airspace opacities throughout both lungs. Peripheral predominance most notable in the left lung base. Lung volumes are diminished with streaky areas of atelectasis as well. No pneumothorax or visible effusion. Cardiomediastinal contours are stable accounting for differences in technique and lung volumes. Degenerative changes are present in the imaged spine and shoulders. Cervical spinal fusion hardware is incompletely assessed. IMPRESSION: Worsening bilateral interstitial and airspace opacities throughout both lungs, compatible with a multifocal pneumonia such as atypical viral pneumonia including COVID-19. Electronically Signed   By: Lovena Le M.D.   On: 01/24/2019 22:17   Dg Chest Port 1v Same Day  Result Date: 01/26/2019 CLINICAL DATA:  Shortness of breath EXAM: PORTABLE CHEST 1 VIEW COMPARISON:  01/24/2019 FINDINGS: Heart is borderline enlarged. There diffuse bilateral airspace opacities noted with some peripheral predominance. This is worsened  since prior study. Low lung volumes. No visible effusions. IMPRESSION: Cardiomegaly. Patchy bilateral airspace opacities with peripheral predominance. This is worsened since prior study concerning for multifocal pneumonia. Electronically Signed   By: Rolm Baptise M.D.   On: 01/26/2019 09:14

## 2019-02-03 NOTE — Progress Notes (Signed)
Physical Therapy Treatment Patient Details Name: Nathan Perkins MRN: [id number] DOB: [birth year] Today's Date: 02/03/2019    History of Present Illness is a 63 y.o. male with PMHx of insulin-dependent DM-2, HTN presented  to ER with 1 week history of cough, fever, myalgias and worsening shortness of breath.  Patient was found to have acute hypoxic respiratory failure in setting of COVID-19 pneumonia .Admitted 01/24/19    PT Comments    Pt had difficult session this pm. Staff report he did much better this pm than am. Pt was found on 3L/min via [place] and was agreeable to tx. Initially left pt on 3L/min via [place] read on pedi earlobe probe. Pt able to stand from recliner with SBA and line management, once standing had 3 bouts of violent coughing but was able to recover, desat only to 88% Pt able to continue with tx. Ambulated approx 71ft with no AD and min guard assist, while ambulating sats between 85-88% on 3L/min, once completed pt is very fatigued and desat to 81% attempted to bump to 4L but unable to get higher than 83, once bumped to 6L/min able to regain sats to 90s. With cues and pursed lip breathing by end of session was able to return to 3L/min and maintain sats in 90s while reclining in chair. As pt fluctuates between good and difficult session he would benefit from HHPT at d/c.   Follow Up Recommendations  Home health PT     Equipment Recommendations  None recommended by PT    Recommendations for Other Services       Precautions / Restrictions Precautions Precautions: Fall Precaution Comments: probe on earlobe Restrictions Weight Bearing Restrictions: No    Mobility  Bed Mobility               General bed mobility comments: pt in recliner at therapist arrival  Transfers Overall transfer level: Needs assistance Equipment used: None Transfers: Sit to/from Stand Sit to Stand: Supervision         General transfer comment: able to complete sit<>stand from  recliner but immediately has 3 bouts of violent coughing, desat to 88% on 3L/min via [place]  Ambulation/Gait Ambulation/Gait assistance: Min guard Gait Distance (Feet): 40 Feet Assistive device: None Gait Pattern/deviations: Step-through pattern;Staggering left;Staggering right Gait velocity: slow   General Gait Details: ambulated 1 x 40 ft with no AD and min guard assist on 3L/min via [place] did well until completed (was in 85-88% range when ambulating) once seated pt looks extremely  fatigued and sats drop to 81% with cues not able to raise sats > than 83% increased 02 to 4L with same results, increased 02 to 6L/min via [place] and was able to increase saturation to low 90s and maintain here. Also labour of breating decreased and pt was able to conversate as prior to ambulation   Chief Strategy Officer    Modified Rankin (Stroke Patients Only)       Balance Overall balance assessment: Needs assistance Sitting-balance support: Feet unsupported Sitting balance-Leahy Scale: Good     Standing balance support: During functional activity Standing balance-Leahy Scale: Fair Standing balance comment: noted some staggering L and R while ambulating                            Cognition Arousal/Alertness: Awake/alert Behavior During Therapy: WFL for tasks assessed/performed Overall Cognitive Status: Within Functional Limits for  tasks assessed                                        Exercises Other Exercises Other Exercises: worked on incentive spirometer and flutter valve    General Comments General comments (skin integrity, edema, etc.): Arrived in room to find pt sitting in recliner, states that he is quite fatigued, when asked what he had done states washed up and brushed his teeth earlier in the day. Pt is agreeable to attempting ambulation in hall as he is on 3L/min [place] and was sating in 90s prior to commencement. Once initiated  and pt  stand from recliner has 3 major bouts of violent coughing (non productive) and desats to 88%. pt states this happens  but he is ok to continue. ambulated approx 68ft when pt seemed to have significnat increased labour of breathing  and sats fluctuating between 88-85%. decided to cut gait short and take a break. once seated desat to 81% and was unable to regain sats to 90s until increased to 6L/min (gradually). at end of session pt was returned to 3L/min and sats remained in 90s.      Pertinent Vitals/Pain Pain Assessment: No/denies pain Pain Location: denies pain but noted to be straining with breathing after ambulation Pain Intervention(s): Limited activity within patient's tolerance    Home Living                      Prior Function            PT Goals (current goals can now be found in the care plan section) Acute Rehab PT Goals Patient Stated Goal: to gte stronger to be able to spend time with his family Time For Goal Achievement: 02/11/19 Potential to Achieve Goals: Fair Progress towards PT goals: Not progressing toward goals - comment(fluctuates has good days and hard days)    Frequency    Min 3X/week      PT Plan Discharge plan needs to be updated    Co-evaluation              AM-PAC PT "6 Clicks" Mobility   Outcome Measure  Help needed turning from your back to your side while in a flat bed without using bedrails?: A Little Help needed moving from lying on your back to sitting on the side of a flat bed without using bedrails?: A Little Help needed moving to and from a bed to a chair (including a wheelchair)?: A Little Help needed standing up from a chair using your arms (e.g., wheelchair or bedside chair)?: A Little Help needed to walk in hospital room?: A Little Help needed climbing 3-5 steps with a railing? : A Lot 6 Click Score: 17    End of Session Equipment Utilized During Treatment: Oxygen Activity Tolerance: Treatment limited secondary to  medical complications (Comment) Patient left: in chair;with call bell/phone within reach Nurse Communication: Other (comment)(pt needs) PT Visit Diagnosis: Other abnormalities of gait and mobility (R26.89);Muscle weakness (generalized) (M62.81)     Time: [id number] PT Time Calculation (min) (ACUTE ONLY): 27 min  Charges:  $Gait Training: 8-22 mins $Self Care/Home Management: [place] 02/03/2019, 4:25 PM

## 2019-02-03 NOTE — Progress Notes (Signed)
Upon shift arrival patient was at 4L [place] in bed saturating 88%.  Oxygen increased to 6L to assist patient to chair.  Sats dropped to the mid 70's.  O2 increased in increments to 15 L HFNC to maintain sats at 88%.  Approximately 20 minute recovery time.  Was able to return to 5L once recovered and sitting in chair.

## 2019-02-03 NOTE — Progress Notes (Signed)
Inpatient Diabetes Program Recommendations  AACE/ADA: New Consensus Statement on Inpatient Glycemic Control   Target Ranges:  Prepandial:   less than 140 mg/dL      Peak postprandial:   less than 180 mg/dL (1-2 hours)      Critically ill patients:  140 - 180 mg/dL   Results for MARV, BOCKUS (MRN [id number]) as of 02/03/2019 10:42  Ref. Range 02/02/2019 07:41 02/02/2019 09:36 02/02/2019 11:11 02/02/2019 17:53 02/02/2019 21:08 02/03/2019 07:31 02/03/2019 08:05  Glucose-Capillary Latest Ref Range: 70 - 99 mg/dL 62 (L) 187 (H) 242 (H) 260 (H) 398 (H) 47 (L) 71   Review of Glycemic Control  Current orders for Inpatient glycemic control: Levemir 20 units BID, Novolog 0-9 units TID with meals, Novolog 0-5 units QHS; Solumedrol 40 mg daily  Inpatient Diabetes Program Recommendations:   Insulin-Basal: Fasting glucose 47 mg/dl today.  Please consider changing Levemir to 20 units QAM and Levemir 15 units QHS.   Insulin-Meal Coverage: Post prandial glucose is consistently elevated.  If steroids are continued, please consider ordering Novolog 6 units TID with meals for meal coverage if patient eats at least 50% of meals.  Thanks, Barnie Alderman, RN, MSN, CDE Diabetes Coordinator Inpatient Diabetes Program [phone] (Team Pager from 8am to 5pm)

## 2019-02-04 ENCOUNTER — Inpatient Hospital Stay (HOSPITAL_COMMUNITY): Payer: Commercial Managed Care - PPO

## 2019-02-04 LAB — COMPREHENSIVE METABOLIC PANEL
ALT: 38 U/L (ref 0–44)
AST: 19 U/L (ref 15–41)
Albumin: 3.1 g/dL — ABNORMAL LOW (ref 3.5–5.0)
Alkaline Phosphatase: 55 U/L (ref 38–126)
Anion gap: 10 (ref 5–15)
BUN: 25 mg/dL — ABNORMAL HIGH (ref 8–23)
CO2: 27 mmol/L (ref 22–32)
Calcium: 8.7 mg/dL — ABNORMAL LOW (ref 8.9–10.3)
Chloride: 95 mmol/L — ABNORMAL LOW (ref 98–111)
Creatinine, Ser: 0.8 mg/dL (ref 0.61–1.24)
GFR calc Af Amer: >60 mL/min (ref >60)
GFR calc non Af Amer: >60 mL/min (ref >60)
Glucose, Bld: 206 mg/dL — ABNORMAL HIGH (ref 70–99)
Potassium: 4.3 mmol/L (ref 3.5–5.1)
Sodium: 132 mmol/L — ABNORMAL LOW (ref 135–145)
Total Bilirubin: 0.5 mg/dL (ref 0.3–1.2)
Total Protein: 5.8 g/dL — ABNORMAL LOW (ref 6.5–8.1)

## 2019-02-04 LAB — CBC WITH DIFFERENTIAL/PLATELET
Abs Immature Granulocytes: 0.5 10*3/uL — ABNORMAL HIGH (ref 0.00–0.07)
Basophils Absolute: 0.1 10*3/uL (ref 0.0–0.1)
Basophils Relative: 0 %
Eosinophils Absolute: 0.4 10*3/uL (ref 0.0–0.5)
Eosinophils Relative: 2 %
HCT: 44.8 % (ref 39.0–52.0)
Hemoglobin: 15 g/dL (ref 13.0–17.0)
Immature Granulocytes: 3 %
Lymphocytes Relative: 7 %
Lymphs Abs: 1.1 10*3/uL (ref 0.7–4.0)
MCH: 27.1 pg (ref 26.0–34.0)
MCHC: 33.5 g/dL (ref 30.0–36.0)
MCV: 80.9 fL (ref 80.0–100.0)
Monocytes Absolute: 1.2 10*3/uL — ABNORMAL HIGH (ref 0.1–1.0)
Monocytes Relative: 7 %
Neutro Abs: 13 10*3/uL — ABNORMAL HIGH (ref 1.7–7.7)
Neutrophils Relative %: 81 %
Platelets: 391 10*3/uL (ref 150–400)
RBC: 5.54 MIL/uL (ref 4.22–5.81)
RDW: 12 % (ref 11.5–15.5)
WBC: 16.3 10*3/uL — ABNORMAL HIGH (ref 4.0–10.5)
nRBC: 0 % (ref 0.0–0.2)

## 2019-02-04 LAB — GLUCOSE, CAPILLARY
Glucose-Capillary: 60 mg/dL — ABNORMAL LOW (ref 70–99)
Glucose-Capillary: 362 mg/dL — ABNORMAL HIGH (ref 70–99)
Glucose-Capillary: 426 mg/dL — ABNORMAL HIGH (ref 70–99)
Glucose-Capillary: 284 mg/dL — ABNORMAL HIGH (ref 70–99)

## 2019-02-04 LAB — D-DIMER, QUANTITATIVE: D-Dimer, Quant: 10.77 ug/mL-FEU — ABNORMAL HIGH (ref 0.00–0.50)

## 2019-02-04 LAB — MAGNESIUM: Magnesium: 2.2 mg/dL (ref 1.7–2.4)

## 2019-02-04 LAB — C-REACTIVE PROTEIN: CRP: 0.9 mg/dL (ref <1.0)

## 2019-02-04 LAB — BRAIN NATRIURETIC PEPTIDE: B Natriuretic Peptide: 41 pg/mL (ref 0.0–100.0)

## 2019-02-04 MED ORDER — ENOXAPARIN SODIUM 60 MG/0.6ML ~~LOC~~ SOLN
60.0000 mg | SUBCUTANEOUS | Status: DC
  Administered 2019-02-06 – 2019-02-07 (×2): 60 mg via SUBCUTANEOUS
  Filled 2019-02-04 (×2): qty 0.6

## 2019-02-04 MED ORDER — IOHEXOL 350 MG/ML SOLN
100.0000 mL | Freq: Once | INTRAVENOUS | Status: AC | PRN
  Administered 2019-02-04: 100 mL via INTRAVENOUS

## 2019-02-04 MED ORDER — INSULIN ASPART 100 UNIT/ML ~~LOC~~ SOLN
25.0000 [IU] | Freq: Once | SUBCUTANEOUS | Status: AC
  Administered 2019-02-04: 25 [IU] via SUBCUTANEOUS

## 2019-02-04 MED ORDER — ENOXAPARIN SODIUM 60 MG/0.6ML ~~LOC~~ SOLN: 60.0000 mg | SUBCUTANEOUS | Status: DC

## 2019-02-04 MED ORDER — INSULIN ASPART 100 UNIT/ML ~~LOC~~ SOLN
5.0000 [IU] | Freq: Three times a day (TID) | SUBCUTANEOUS | Status: DC
  Administered 2019-02-04: 5 [IU] via SUBCUTANEOUS

## 2019-02-04 MED ORDER — ENOXAPARIN SODIUM 60 MG/0.6ML ~~LOC~~ SOLN
60.0000 mg | Freq: Two times a day (BID) | SUBCUTANEOUS | Status: AC
  Administered 2019-02-04 – 2019-02-05 (×3): 60 mg via SUBCUTANEOUS
  Filled 2019-02-04 (×3): qty 0.6

## 2019-02-04 NOTE — Progress Notes (Signed)
At 1700 patient's blood glucose was 426, contacted Dr. Candiss Norse, he orderd 25 untis of novolog to be given immediately and the patient's levemir to be given early.

## 2019-02-04 NOTE — Progress Notes (Signed)
PROGRESS NOTE                                                                                                                                                                                                             Patient Demographics:    Nathan Perkins, is a 63 y.o. male, DOB - 1955/11/03, [id number]  Outpatient Primary MD for the patient is Monico Blitz, MD   Admit date - 01/24/2019   LOS - 49  Chief Complaint  Patient presents with  . Shortness of Breath       Brief Narrative: Patient is a 63 y.o. male with PMHx of insulin-dependent DM-2, HTN presented with 1 week history of cough, fever, myalgias and worsening shortness of breath.  Patient was found to have acute hypoxic respiratory failure in setting of COVID-19 pneumonia at [facility] course was complicated by rapidly worsening respiratory failure in spite of being treated with steroids, remdesivir and Actemra.  See below for further details.   Subjective:   Patient in bed, appears comfortable, denies any headache, no fever, no chest pain or pressure, no shortness of breath , no abdominal pain. No focal weakness.   Assessment  & Plan :   Acute Hypoxic Resp Failure due to Covid 19 Viral pneumonia: he has severe COVID-19 pneumonitis and acute hypoxic respiratory failure, now on 2-3 lit [place] oxygen down from 15 L high flow nasal cannula oxygen, he is now in no distress but remains a little anxious.   He has so far received full treatment including IV steroids, remdesivir, Actemra, convalescent plasma.  His inflammatory markers are finally trending down and he appears to have stabilized except for some ongoing anxiety.  Need to taper down steroids.  Prepare for discharge in the next 1 to 2 days.  He and his wife has been told that he might go home on a low-dose oxygen if needed.     COVID-19 Labs:  Recent Labs    02/02/19 0200 02/03/19 0050  02/04/19 0004  DDIMER 2.22* 6.95* 10.77*  CRP <0.8 1.0* 0.9    Lab Results  Component Value Date   SARSCOV2NAA POSITIVE (A) 01/24/2019    Hepatic Function Latest Ref Rng & Units 02/04/2019 02/03/2019 02/02/2019  Total Protein 6.5 - 8.1 g/dL 5.8(L) 5.4(L) 5.8(L)  Albumin 3.5 - 5.0 g/dL 3.1(L) 2.8(L) 3.0(L)  AST 15 - 41 U/L '19 17 22  ' ALT 0 - 44 U/L 38 45(H) 57(H)  Alk Phosphatase 38 - 126 U/L 55 49 46  Total Bilirubin 0.3 - 1.2 mg/dL 0.5 0.7 0.7    COVID-19 Medications:  Steroids: 10/20>> Remdesivir: 10/21>>10/26 Actemra:10/21 x 1 Convalescent Plasma: 10/22   Transaminitis.  Likely due to combination of COVID-19 infection and Remdesivir.  Finishing remdesivir soon, symptom-free will monitor trend.    Rising D-dimer for the last 2 days.  Lovenox on 24th of this month was accidentally discontinued as linked order with daily serum creatinine when serum creatinine was discontinued.  Lovenox resumed, will check lower extremity venous duplex along with CTA as well.   HTN: BP relatively controlled-continue amlodipine and follow.  Extreme anxiety.  Added Klonopin twice daily will monitor.  Hold for sedation.  Leukocytosis - likely steroid related will monitor closely.  No productive cough, no dysuria, no fever.    Insulin-dependent DM-2: Became hypoglycemic on 02/02/2019, did insulin dose adjusted with holding instructions, drop likely due to tapering of steroids, will monitor closely.  Lab Results  Component Value Date   HGBA1C 8.6 (H) 01/24/2019   CBG (last 3)  Recent Labs    02/03/19 1554 02/03/19 2015 02/04/19 0723  GLUCAP 392* 381* 60*     Condition -  Fair  Family Communication  : Discussed with wife on 01/28/2019, 01/31/19.  Code Status :  Full Code  Diet :  Diet Order            Diet Carb Modified Fluid consistency: Thin; Room service appropriate? Yes  Diet effective now               Disposition Plan  : Discharge in the next 1 to 2 days depending  on progress  Barriers to discharge: Hypoxemia  Consults  :  None  Procedures  :    Lower extremity venous duplex.  CTA.  PE protocol.   Antibiotics  :    Anti-infectives (From admission, onward)   Start     Dose/Rate Route Frequency Ordered Stop   01/26/19 1600  remdesivir 100 mg in sodium chloride 0.9 % 250 mL IVPB     100 mg 500 mL/hr over 30 Minutes Intravenous Every 24 hours 01/25/19 1445 01/29/19 1720   01/25/19 1600  remdesivir 200 mg in sodium chloride 0.9 % 250 mL IVPB     200 mg 500 mL/hr over 30 Minutes Intravenous Once 01/25/19 1445 01/25/19 1727      Inpatient Medications  Scheduled Meds: . albuterol  2 puff Inhalation Q6H  . amLODipine  5 mg Oral Daily  . aspirin  81 mg Oral Daily  . benzonatate  200 mg Oral TID  . clonazepam  0.5 mg Oral BID  . enoxaparin (LOVENOX) injection  60 mg Subcutaneous Q12H  . [START ON 02/06/2019] enoxaparin (LOVENOX) injection  60 mg Subcutaneous Q24H  . guaiFENesin  600 mg Oral BID  . insulin aspart  0-5 Units Subcutaneous QHS  . insulin aspart  0-9 Units Subcutaneous TID WC  . insulin detemir  20 Units Subcutaneous BID  . methylPREDNISolone (SOLU-MEDROL) injection  20 mg Intravenous Daily  . pantoprazole  40 mg Oral Daily  . vitamin C  1,000 mg Oral Daily  . zinc sulfate  220 mg Oral Daily   Continuous Infusions:  PRN Meds:.labetalol, [DISCONTINUED] ondansetron **OR** ondansetron (ZOFRAN) IV, sodium chloride, traMADol   Time Spent in minutes  35  See all Orders from  today for further details   Lala Lund M.D on 02/04/2019 at 9:15 AM  To page go to [email or web address] - use universal password  [facility] -  Office  [phone]    Objective:   Vitals:   02/03/19 2015 02/04/19 0600 02/04/19 0727 02/04/19 0830  BP: (!) 150/80 129/76 119/79 124/72  Pulse: 95 80 (!) 104 95  Resp:   (!) 22 20  Temp: 98.6 F (37 C) 98.6 F (37 C) 98 F (36.7 C)   TempSrc: Oral Oral Oral   SpO2: 95%  (!) 76% 96%   Weight:      Height:        Wt Readings from Last 3 Encounters:  01/25/19 90.1 kg  07/30/17 90.7 kg  01/07/15 92.5 kg     Intake/Output Summary (Last 24 hours) at 02/04/2019 0915 Last data filed at 02/03/2019 2109 Gross per 24 hour  Intake 360 ml  Output 1175 ml  Net -815 ml     Physical Exam  Awake Alert, Oriented X 3, No new F.N deficits, Normal affect [place].AT,PERRAL Supple Neck,No JVD, No cervical lymphadenopathy appriciated.  Symmetrical Chest wall movement, Good air movement bilaterally, CTAB RRR,No Gallops, Rubs or new Murmurs, No Parasternal Heave +ve B.Sounds, Abd Soft, No tenderness, No organomegaly appriciated, No rebound - guarding or rigidity. No Cyanosis, Clubbing or edema, No new Rash or bruise    Data Review:    CBC Recent Labs  Lab 01/31/19 0133 02/01/19 0135 02/02/19 0200 02/03/19 0050 02/04/19 0004  WBC 10.8* 13.2* 22.9* 14.7* 16.3*  HGB 14.4 14.3 15.1 14.7 15.0  HCT 43.0 43.1 45.4 44.4 44.8  PLT 356 412* 576* 393 391  MCV 81.6 81.0 80.5 81.3 80.9  MCH 27.3 26.9 26.8 26.9 27.1  MCHC 33.5 33.2 33.3 33.1 33.5  RDW 11.9 11.9 12.1 12.3 12.0  LYMPHSABS 0.4* 0.5* 2.6 1.1 1.1  MONOABS 0.4 0.8 2.3* 1.1* 1.2*  EOSABS 0.0 0.0 0.3 0.3 0.4  BASOSABS 0.0 0.0 0.1 0.1 0.1    Chemistries  Recent Labs  Lab 01/31/19 0133 02/01/19 0135 02/02/19 0200 02/03/19 0050 02/04/19 0004  NA 136 134* 133* 133* 132*  K 4.8 4.9 4.5 4.4 4.3  CL 99 100 99 97* 95*  CO2 '30 23 27 26 27  ' GLUCOSE 159* 81 39* 167* 206*  BUN 30* 30* 26* 22 25*  CREATININE 0.90 0.83 0.78 0.76 0.80  CALCIUM 8.4* 8.6* 8.6* 8.5* 8.7*  MG 2.4 2.3 2.1 2.0 2.2  AST '31 28 22 17 19  ' ALT 81* 73* 57* 45* 38  ALKPHOS 46 43 46 49 55  BILITOT 0.8 0.4 0.7 0.7 0.5   ------------------------------------------------------------------------------------------------------------------ No results for input(s): CHOL, HDL, LDLCALC, TRIG, CHOLHDL, LDLDIRECT in the last 72 hours.  Lab Results   Component Value Date   HGBA1C 8.6 (H) 01/24/2019   ------------------------------------------------------------------------------------------------------------------ No results for input(s): TSH, T4TOTAL, T3FREE, THYROIDAB in the last 72 hours.  Invalid input(s): FREET3 ------------------------------------------------------------------------------------------------------------------ No results for input(s): VITAMINB12, FOLATE, FERRITIN, TIBC, IRON, RETICCTPCT in the last 72 hours.  Coagulation profile No results for input(s): INR, PROTIME in the last 168 hours.  Recent Labs    02/03/19 0050 02/04/19 0004  DDIMER 6.95* 10.77*    Cardiac Enzymes No results for input(s): CKMB, TROPONINI, MYOGLOBIN in the last 168 hours.  Invalid input(s): CK ------------------------------------------------------------------------------------------------------------------    Component Value Date/Time   BNP 41.0 02/04/2019 0004    Micro Results No results found for this or any previous visit (from the past  240 hour(s)).  Radiology Reports Dg Chest Port 1 View  Result Date: 01/24/2019 CLINICAL DATA:  Difficulty breathing, concern for COVID-19 EXAM: PORTABLE CHEST 1 VIEW COMPARISON:  Radiograph 01/23/2019 FINDINGS: Worsening bilateral interstitial and airspace opacities throughout both lungs. Peripheral predominance most notable in the left lung base. Lung volumes are diminished with streaky areas of atelectasis as well. No pneumothorax or visible effusion. Cardiomediastinal contours are stable accounting for differences in technique and lung volumes. Degenerative changes are present in the imaged spine and shoulders. Cervical spinal fusion hardware is incompletely assessed. IMPRESSION: Worsening bilateral interstitial and airspace opacities throughout both lungs, compatible with a multifocal pneumonia such as atypical viral pneumonia including COVID-19. Electronically Signed   By: Lovena Le M.D.    On: 01/24/2019 22:17   Dg Chest Port 1v Same Day  Result Date: 01/26/2019 CLINICAL DATA:  Shortness of breath EXAM: PORTABLE CHEST 1 VIEW COMPARISON:  01/24/2019 FINDINGS: Heart is borderline enlarged. There diffuse bilateral airspace opacities noted with some peripheral predominance. This is worsened since prior study. Low lung volumes. No visible effusions. IMPRESSION: Cardiomegaly. Patchy bilateral airspace opacities with peripheral predominance. This is worsened since prior study concerning for multifocal pneumonia. Electronically Signed   By: Rolm Baptise M.D.   On: 01/26/2019 09:14

## 2019-02-05 ENCOUNTER — Inpatient Hospital Stay (HOSPITAL_COMMUNITY): Payer: Commercial Managed Care - PPO

## 2019-02-05 DIAGNOSIS — R7989 Other specified abnormal findings of blood chemistry: Secondary | ICD-10-CM

## 2019-02-05 LAB — CBC WITH DIFFERENTIAL/PLATELET
Abs Immature Granulocytes: 0.43 10*3/uL — ABNORMAL HIGH (ref 0.00–0.07)
Basophils Absolute: 0.1 10*3/uL (ref 0.0–0.1)
Basophils Relative: 0 %
Eosinophils Absolute: 0.2 10*3/uL (ref 0.0–0.5)
Eosinophils Relative: 1 %
HCT: 44 % (ref 39.0–52.0)
Hemoglobin: 14.6 g/dL (ref 13.0–17.0)
Immature Granulocytes: 2 %
Lymphocytes Relative: 5 %
Lymphs Abs: 0.9 10*3/uL (ref 0.7–4.0)
MCH: 27.2 pg (ref 26.0–34.0)
MCHC: 33.2 g/dL (ref 30.0–36.0)
MCV: 82.1 fL (ref 80.0–100.0)
Monocytes Absolute: 1 10*3/uL (ref 0.1–1.0)
Monocytes Relative: 6 %
Neutro Abs: 15.5 10*3/uL — ABNORMAL HIGH (ref 1.7–7.7)
Neutrophils Relative %: 86 %
Platelets: 385 10*3/uL (ref 150–400)
RBC: 5.36 MIL/uL (ref 4.22–5.81)
RDW: 12.1 % (ref 11.5–15.5)
WBC: 18.1 10*3/uL — ABNORMAL HIGH (ref 4.0–10.5)
nRBC: 0 % (ref 0.0–0.2)

## 2019-02-05 LAB — COMPREHENSIVE METABOLIC PANEL
ALT: 35 U/L (ref 0–44)
AST: 18 U/L (ref 15–41)
Albumin: 3.1 g/dL — ABNORMAL LOW (ref 3.5–5.0)
Alkaline Phosphatase: 55 U/L (ref 38–126)
Anion gap: 12 (ref 5–15)
BUN: 21 mg/dL (ref 8–23)
CO2: 28 mmol/L (ref 22–32)
Calcium: 8.8 mg/dL — ABNORMAL LOW (ref 8.9–10.3)
Chloride: 93 mmol/L — ABNORMAL LOW (ref 98–111)
Creatinine, Ser: 0.7 mg/dL (ref 0.61–1.24)
GFR calc Af Amer: >60 mL/min (ref >60)
GFR calc non Af Amer: >60 mL/min (ref >60)
Glucose, Bld: 147 mg/dL — ABNORMAL HIGH (ref 70–99)
Potassium: 3.9 mmol/L (ref 3.5–5.1)
Sodium: 133 mmol/L — ABNORMAL LOW (ref 135–145)
Total Bilirubin: 0.8 mg/dL (ref 0.3–1.2)
Total Protein: 5.6 g/dL — ABNORMAL LOW (ref 6.5–8.1)

## 2019-02-05 LAB — GLUCOSE, CAPILLARY
Glucose-Capillary: 162 mg/dL — ABNORMAL HIGH (ref 70–99)
Glucose-Capillary: 441 mg/dL — ABNORMAL HIGH (ref 70–99)
Glucose-Capillary: 158 mg/dL — ABNORMAL HIGH (ref 70–99)
Glucose-Capillary: 283 mg/dL — ABNORMAL HIGH (ref 70–99)

## 2019-02-05 LAB — MAGNESIUM: Magnesium: 2.2 mg/dL (ref 1.7–2.4)

## 2019-02-05 LAB — D-DIMER, QUANTITATIVE: D-Dimer, Quant: 3.85 ug/mL-FEU — ABNORMAL HIGH (ref 0.00–0.50)

## 2019-02-05 LAB — C-REACTIVE PROTEIN: CRP: 0.8 mg/dL (ref <1.0)

## 2019-02-05 MED ORDER — INSULIN ASPART 100 UNIT/ML ~~LOC~~ SOLN
3.0000 [IU] | Freq: Three times a day (TID) | SUBCUTANEOUS | Status: DC
  Administered 2019-02-05 (×2): 3 [IU] via SUBCUTANEOUS

## 2019-02-05 MED ORDER — INSULIN ASPART 100 UNIT/ML ~~LOC~~ SOLN
30.0000 [IU] | Freq: Once | SUBCUTANEOUS | Status: AC
  Administered 2019-02-05: 30 [IU] via SUBCUTANEOUS

## 2019-02-05 NOTE — Progress Notes (Addendum)
PROGRESS NOTE                                                                                                                                                                                                             Patient Demographics:    Nathan Perkins, is a 63 y.o. male, DOB - 1955-10-06, [id number]  Outpatient Primary MD for the patient is Monico Blitz, MD   Admit date - 01/24/2019   LOS - 95  Chief Complaint  Patient presents with  . Shortness of Breath       Brief Narrative: Patient is a 63 y.o. male with PMHx of insulin-dependent DM-2, HTN presented with 1 week history of cough, fever, myalgias and worsening shortness of breath.  Patient was found to have acute hypoxic respiratory failure in setting of COVID-19 pneumonia at [facility] course was complicated by rapidly worsening respiratory failure in spite of being treated with steroids, remdesivir and Actemra.  See below for further details.   Subjective:   Patient in bed, appears comfortable, denies any headache, no fever, no chest pain or pressure, no shortness of breath , no abdominal pain. No focal weakness.   Assessment  & Plan :   Acute Hypoxic Resp Failure due to Covid 19 Viral pneumonia: he has severe COVID-19 pneumonitis and acute hypoxic respiratory failure, now on 2 lit [place] oxygen down from 15 L high flow nasal cannula oxygen, he is now in no distress but remains a little anxious.   He has so far received full treatment including IV steroids, remdesivir, Actemra, convalescent plasma.  His inflammatory markers are finally trending down and he appears to have stabilized except for some ongoing anxiety.  Need to taper down steroids.  Prepare for discharge in the next 1 to 2 days.  He and his wife has been told that he might go home on a low-dose oxygen if needed.     COVID-19 Labs:  Recent Labs    02/03/19 0050 02/04/19 0004  02/05/19 0008  DDIMER 6.95* 10.77* 3.85*  CRP 1.0* 0.9 0.8    Lab Results  Component Value Date   SARSCOV2NAA POSITIVE (A) 01/24/2019    Hepatic Function Latest Ref Rng & Units 02/05/2019 02/04/2019 02/03/2019  Total Protein 6.5 - 8.1 g/dL 5.6(L) 5.8(L) 5.4(L)  Albumin 3.5 - 5.0 g/dL 3.1(L) 3.1(L) 2.8(L)  AST 15 - 41 U/L 18 19 17   ALT 0 - 44 U/L 35 38 45(H)  Alk Phosphatase 38 - 126 U/L 55 55 49  Total Bilirubin 0.3 - 1.2 mg/dL 0.8 0.5 0.7    COVID-19 Medications:  Steroids: 10/20>> Remdesivir: 10/21>>10/26 Actemra:10/21 x 1 Convalescent Plasma: 10/22   Transaminitis.  Likely due to combination of COVID-19 infection and Remdesivir.  Finishing remdesivir soon, symptom-free will monitor trend.    Rising D-dimer .  Lovenox on 24th of this month was accidentally discontinued as linked order with daily serum creatinine when serum creatinine was discontinued.  Lovenox resumed, CTA negative, lower extremity ultrasound pending.  D-dimer much improved.  HTN: BP relatively controlled-continue amlodipine and follow.  Extreme anxiety.  Added Klonopin twice daily will monitor.  Hold for sedation.  Leukocytosis - likely steroid related will monitor closely.  No productive cough, no dysuria, no fever.    Insulin-dependent DM-2: Became hypoglycemic on 02/02/2019, did insulin dose adjusted with holding instructions, drop likely due to tapering of steroids, will monitor closely.  Lab Results  Component Value Date   HGBA1C 8.6 (H) 01/24/2019   CBG (last 3)  Recent Labs    02/04/19 1659 02/04/19 2024 02/05/19 0815  GLUCAP 426* 284* 162*     Condition -  Fair  Family Communication  : Discussed with wife on 01/28/2019, 01/31/19.  Code Status :  Full Code  Diet :  Diet Order            Diet Carb Modified Fluid consistency: Thin; Room service appropriate? Yes  Diet effective now               Disposition Plan  : Discharge in the next 1 to 2 days depending on progress   Barriers to discharge: Hypoxemia  Consults  :  None  Procedures  :    Lower extremity venous duplex.  CTA.  PE protocol. - No PE.   Antibiotics  :    Anti-infectives (From admission, onward)   Start     Dose/Rate Route Frequency Ordered Stop   01/26/19 1600  remdesivir 100 mg in sodium chloride 0.9 % 250 mL IVPB     100 mg 500 mL/hr over 30 Minutes Intravenous Every 24 hours 01/25/19 1445 01/29/19 1720   01/25/19 1600  remdesivir 200 mg in sodium chloride 0.9 % 250 mL IVPB     200 mg 500 mL/hr over 30 Minutes Intravenous Once 01/25/19 1445 01/25/19 1727      Inpatient Medications  Scheduled Meds: . albuterol  2 puff Inhalation Q6H  . amLODipine  5 mg Oral Daily  . aspirin  81 mg Oral Daily  . benzonatate  200 mg Oral TID  . clonazepam  0.5 mg Oral BID  . [START ON 02/06/2019] enoxaparin (LOVENOX) injection  60 mg Subcutaneous Q24H  . guaiFENesin  600 mg Oral BID  . insulin aspart  0-5 Units Subcutaneous QHS  . insulin aspart  0-9 Units Subcutaneous TID WC  . insulin aspart  3 Units Subcutaneous TID WC  . insulin detemir  20 Units Subcutaneous BID  . methylPREDNISolone (SOLU-MEDROL) injection  20 mg Intravenous Daily  . pantoprazole  40 mg Oral Daily  . vitamin C  1,000 mg Oral Daily  . zinc sulfate  220 mg Oral Daily   Continuous Infusions:  PRN Meds:.labetalol, [DISCONTINUED] ondansetron **OR** ondansetron (ZOFRAN) IV, sodium chloride, traMADol   Time Spent in minutes  35  See all Orders from today for further  details   Lala Lund M.D on 02/05/2019 at 9:59 AM  To page go to [email or web address] - use universal password  [facility] -  Office  [phone]    Objective:   Vitals:   02/04/19 1600 02/04/19 1944 02/05/19 0453 02/05/19 0722  BP: 125/76 (!) 129/94 (!) 142/79 135/86  Pulse: 92 98 83 78  Resp: 20   18  Temp: 97.8 F (36.6 C) 97.7 F (36.5 C) 97.7 F (36.5 C) 98.3 F (36.8 C)  TempSrc: Oral Oral Oral Oral  SpO2: 99% 96% 90% 91%   Weight:      Height:        Wt Readings from Last 3 Encounters:  01/25/19 90.1 kg  07/30/17 90.7 kg  01/07/15 92.5 kg     Intake/Output Summary (Last 24 hours) at 02/05/2019 0959 Last data filed at 02/05/2019 0942 Gross per 24 hour  Intake 960 ml  Output 1900 ml  Net -940 ml     Physical Exam  Awake Alert, Oriented X 3, No new F.N deficits, Normal affect [place].AT,PERRAL Supple Neck,No JVD, No cervical lymphadenopathy appriciated.  Symmetrical Chest wall movement, Good air movement bilaterally, CTAB RRR,No Gallops, Rubs or new Murmurs, No Parasternal Heave +ve B.Sounds, Abd Soft, No tenderness, No organomegaly appriciated, No rebound - guarding or rigidity. No Cyanosis, Clubbing or edema, No new Rash or bruise   Data Review:    CBC Recent Labs  Lab 02/01/19 0135 02/02/19 0200 02/03/19 0050 02/04/19 0004 02/05/19 0008  WBC 13.2* 22.9* 14.7* 16.3* 18.1*  HGB 14.3 15.1 14.7 15.0 14.6  HCT 43.1 45.4 44.4 44.8 44.0  PLT 412* 576* 393 391 385  MCV 81.0 80.5 81.3 80.9 82.1  MCH 26.9 26.8 26.9 27.1 27.2  MCHC 33.2 33.3 33.1 33.5 33.2  RDW 11.9 12.1 12.3 12.0 12.1  LYMPHSABS 0.5* 2.6 1.1 1.1 0.9  MONOABS 0.8 2.3* 1.1* 1.2* 1.0  EOSABS 0.0 0.3 0.3 0.4 0.2  BASOSABS 0.0 0.1 0.1 0.1 0.1    Chemistries  Recent Labs  Lab 02/01/19 0135 02/02/19 0200 02/03/19 0050 02/04/19 0004 02/05/19 0008  NA 134* 133* 133* 132* 133*  K 4.9 4.5 4.4 4.3 3.9  CL 100 99 97* 95* 93*  CO2 23 27 26 27 28   GLUCOSE 81 39* 167* 206* 147*  BUN 30* 26* 22 25* 21  CREATININE 0.83 0.78 0.76 0.80 0.70  CALCIUM 8.6* 8.6* 8.5* 8.7* 8.8*  MG 2.3 2.1 2.0 2.2 2.2  AST 28 22 17 19 18   ALT 73* 57* 45* 38 35  ALKPHOS 43 46 49 55 55  BILITOT 0.4 0.7 0.7 0.5 0.8   ------------------------------------------------------------------------------------------------------------------ No results for input(s): CHOL, HDL, LDLCALC, TRIG, CHOLHDL, LDLDIRECT in the last 72 hours.  Lab Results  Component  Value Date   HGBA1C 8.6 (H) 01/24/2019   ------------------------------------------------------------------------------------------------------------------ No results for input(s): TSH, T4TOTAL, T3FREE, THYROIDAB in the last 72 hours.  Invalid input(s): FREET3 ------------------------------------------------------------------------------------------------------------------ No results for input(s): VITAMINB12, FOLATE, FERRITIN, TIBC, IRON, RETICCTPCT in the last 72 hours.  Coagulation profile No results for input(s): INR, PROTIME in the last 168 hours.  Recent Labs    02/04/19 0004 02/05/19 0008  DDIMER 10.77* 3.85*    Cardiac Enzymes No results for input(s): CKMB, TROPONINI, MYOGLOBIN in the last 168 hours.  Invalid input(s): CK ------------------------------------------------------------------------------------------------------------------    Component Value Date/Time   BNP 41.0 02/04/2019 0004    Micro Results No results found for this or any previous visit (from the past 240 hour(s)).  Radiology Reports Ct Angio Chest Pe W Or Wo Contrast  Result Date: 02/04/2019 CLINICAL DATA:  Positive D-dimer.  PE suspected. EXAM: CT ANGIOGRAPHY CHEST WITH CONTRAST TECHNIQUE: Multidetector CT imaging of the chest was performed using the standard protocol during bolus administration of intravenous contrast. Multiplanar CT image reconstructions and MIPs were obtained to evaluate the vascular anatomy. CONTRAST:  198m OMNIPAQUE IOHEXOL 350 MG/ML SOLN COMPARISON:  Chest radiograph, most recent 01/26/2019. FINDINGS: Cardiovascular: There is suboptimal opacification of the central pulmonary arteries and the mid to upper lung pulmonary arteries. There is dense opacification of pulmonary arteries to the lower lobes and right middle lobe and portions of the left upper lobe lingula. Where the pulmonary arteries are well opacified, there is no evidence of a pulmonary embolism. No convincing central  pulmonary embolus. Upper lobe pulmonary emboli cannot be excluded. Heart is normal in size and configuration. No pericardial effusion. No coronary artery calcifications. Great vessels are normal in caliber. No aortic dissection or atherosclerosis. Mediastinum/Nodes: No neck base, axillary, mediastinal or hilar masses or enlarged lymph nodes. Trachea is widely patent. Esophagus mildly distended with air but otherwise unremarkable. Lungs/Pleura: Patchy bilateral airspace lung opacities some ground-glass and others more confluent, seen throughout both lungs. Small cystic spaces are also noted primarily in the upper lobes. There is also mild upper lobe paraseptal emphysema. No pleural effusion.  No pneumothorax. Upper Abdomen: No acute abnormality. Musculoskeletal: No chest wall abnormality. No acute or significant osseous findings. Review of the MIP images confirms the above findings. IMPRESSION: 1. Suboptimal study. Contrast opacifies the lower lung pulmonary arteries, but there is no opacification in the upper lung pulmonary arteries in limited central pulmonary artery opacification. Where the arteries are opacified, there is no evidence of a pulmonary embolism. 2. Bilateral confluent and ground-glass airspace lung opacities with a peripheral distribution, consistent with multifocal pneumonia. Patient has had a positive coronavirus test. The pattern of pneumonia is consistent with coronavirus infection. 3. Mild underlying centrilobular and paraseptal emphysema. Electronically Signed   By: DLajean ManesM.D.   On: 02/04/2019 11:17   Dg Chest Port 1 View  Result Date: 01/24/2019 CLINICAL DATA:  Difficulty breathing, concern for COVID-19 EXAM: PORTABLE CHEST 1 VIEW COMPARISON:  Radiograph 01/23/2019 FINDINGS: Worsening bilateral interstitial and airspace opacities throughout both lungs. Peripheral predominance most notable in the left lung base. Lung volumes are diminished with streaky areas of atelectasis as well.  No pneumothorax or visible effusion. Cardiomediastinal contours are stable accounting for differences in technique and lung volumes. Degenerative changes are present in the imaged spine and shoulders. Cervical spinal fusion hardware is incompletely assessed. IMPRESSION: Worsening bilateral interstitial and airspace opacities throughout both lungs, compatible with a multifocal pneumonia such as atypical viral pneumonia including COVID-19. Electronically Signed   By: PLovena LeM.D.   On: 01/24/2019 22:17   Dg Chest Port 1v Same Day  Result Date: 01/26/2019 CLINICAL DATA:  Shortness of breath EXAM: PORTABLE CHEST 1 VIEW COMPARISON:  01/24/2019 FINDINGS: Heart is borderline enlarged. There diffuse bilateral airspace opacities noted with some peripheral predominance. This is worsened since prior study. Low lung volumes. No visible effusions. IMPRESSION: Cardiomegaly. Patchy bilateral airspace opacities with peripheral predominance. This is worsened since prior study concerning for multifocal pneumonia. Electronically Signed   By: KRolm BaptiseM.D.   On: 01/26/2019 09:14

## 2019-02-05 NOTE — Progress Notes (Signed)
Bilateral lower extremity venous duplex has been completed. Preliminary results can be found in CV Proc through chart review.  Results were given to the patient's nurse, Grayland Ormond.  02/05/19 9:06 AM Carlos Levering RVT

## 2019-02-06 LAB — C-REACTIVE PROTEIN: CRP: <0.8 mg/dL (ref <1.0)

## 2019-02-06 LAB — COMPREHENSIVE METABOLIC PANEL
ALT: 33 U/L (ref 0–44)
AST: 20 U/L (ref 15–41)
Albumin: 3.2 g/dL — ABNORMAL LOW (ref 3.5–5.0)
Alkaline Phosphatase: 47 U/L (ref 38–126)
Anion gap: 11 (ref 5–15)
BUN: 22 mg/dL (ref 8–23)
CO2: 28 mmol/L (ref 22–32)
Calcium: 8.8 mg/dL — ABNORMAL LOW (ref 8.9–10.3)
Chloride: 98 mmol/L (ref 98–111)
Creatinine, Ser: 0.9 mg/dL (ref 0.61–1.24)
GFR calc Af Amer: >60 mL/min (ref >60)
GFR calc non Af Amer: >60 mL/min (ref >60)
Glucose, Bld: 75 mg/dL (ref 70–99)
Potassium: 4.4 mmol/L (ref 3.5–5.1)
Sodium: 137 mmol/L (ref 135–145)
Total Bilirubin: 0.7 mg/dL (ref 0.3–1.2)
Total Protein: 5.5 g/dL — ABNORMAL LOW (ref 6.5–8.1)

## 2019-02-06 LAB — CBC WITH DIFFERENTIAL/PLATELET
Abs Immature Granulocytes: 0.36 10*3/uL — ABNORMAL HIGH (ref 0.00–0.07)
Basophils Absolute: 0 10*3/uL (ref 0.0–0.1)
Basophils Relative: 0 %
Eosinophils Absolute: 0.2 10*3/uL (ref 0.0–0.5)
Eosinophils Relative: 1 %
HCT: 42.9 % (ref 39.0–52.0)
Hemoglobin: 13.9 g/dL (ref 13.0–17.0)
Immature Granulocytes: 3 %
Lymphocytes Relative: 7 %
Lymphs Abs: 1 10*3/uL (ref 0.7–4.0)
MCH: 27.1 pg (ref 26.0–34.0)
MCHC: 32.4 g/dL (ref 30.0–36.0)
MCV: 83.8 fL (ref 80.0–100.0)
Monocytes Absolute: 1.1 10*3/uL — ABNORMAL HIGH (ref 0.1–1.0)
Monocytes Relative: 7 %
Neutro Abs: 11.8 10*3/uL — ABNORMAL HIGH (ref 1.7–7.7)
Neutrophils Relative %: 82 %
Platelets: 347 10*3/uL (ref 150–400)
RBC: 5.12 MIL/uL (ref 4.22–5.81)
RDW: 12.6 % (ref 11.5–15.5)
WBC: 14.5 10*3/uL — ABNORMAL HIGH (ref 4.0–10.5)
nRBC: 0 % (ref 0.0–0.2)

## 2019-02-06 LAB — GLUCOSE, CAPILLARY
Glucose-Capillary: 428 mg/dL — ABNORMAL HIGH (ref 70–99)
Glucose-Capillary: 56 mg/dL — ABNORMAL LOW (ref 70–99)
Glucose-Capillary: 79 mg/dL (ref 70–99)
Glucose-Capillary: 359 mg/dL — ABNORMAL HIGH (ref 70–99)
Glucose-Capillary: 281 mg/dL — ABNORMAL HIGH (ref 70–99)
Glucose-Capillary: 389 mg/dL — ABNORMAL HIGH (ref 70–99)

## 2019-02-06 LAB — D-DIMER, QUANTITATIVE: D-Dimer, Quant: 2.7 ug/mL-FEU — ABNORMAL HIGH (ref 0.00–0.50)

## 2019-02-06 LAB — MAGNESIUM: Magnesium: 2.2 mg/dL (ref 1.7–2.4)

## 2019-02-06 MED ORDER — HYDROCOD POLST-CPM POLST ER 10-8 MG/5ML PO SUER
5.0000 mL | Freq: Two times a day (BID) | ORAL | Status: DC
  Administered 2019-02-06 – 2019-02-07 (×3): 5 mL via ORAL
  Filled 2019-02-06 (×3): qty 5

## 2019-02-06 MED ORDER — METHYLPREDNISOLONE SODIUM SUCC 40 MG IJ SOLR: 10.0000 mg | Freq: Every day | INTRAMUSCULAR | Status: DC

## 2019-02-06 MED ORDER — INSULIN DETEMIR 100 UNIT/ML ~~LOC~~ SOLN: 12.0000 [IU] | Freq: Two times a day (BID) | SUBCUTANEOUS | Status: DC

## 2019-02-06 MED ORDER — INSULIN DETEMIR 100 UNIT/ML ~~LOC~~ SOLN
20.0000 [IU] | Freq: Every day | SUBCUTANEOUS | Status: DC
  Administered 2019-02-07: 20 [IU] via SUBCUTANEOUS
  Filled 2019-02-06: qty 0.2

## 2019-02-06 NOTE — Progress Notes (Signed)
Wife updated

## 2019-02-06 NOTE — TOC Initial Note (Addendum)
Transition of Care [facility]) - Initial/Assessment Note    Patient Details  Name: Nathan Perkins MRN: [id number] Date of Birth: 1955/08/04  Transition [facility]) CM/SW Contact:    Carles Collet, RN Phone Number: 02/06/2019, 1:20 PM  Clinical Narrative:          1:20 Spoke w patient, he declined [place] services, stating "I don't think I need it." He states that he is feeling better everyday, and his wife will be able to care for him at home. He confirmed he has a PCP and will call him if he needs anything or feels worse.        He stated several times that he will need home oxygen. Please obtain ambulatory saturations within 48 hours of DC, or early in the AM of DC date.  15:20 Requested home O2 through [place], Notified AC to deliver transport tank to room from [employer].  CM will continue to follow.    Expected Discharge Plan: Home/Self Care Barriers to Discharge: Continued Medical Work up   Patient Goals and CMS Choice Patient states their goals for this hospitalization and ongoing recovery are:: to go home      Expected Discharge Plan and Services Expected Discharge Plan: Home/Self Care                                              Prior Living Arrangements/Services                       Activities of Daily Living Home Assistive Devices/Equipment: None ADL Screening (condition at time of admission) Patient's cognitive ability adequate to safely complete daily activities?: Yes Is the patient deaf or have difficulty hearing?: No Does the patient have difficulty seeing, even when wearing glasses/contacts?: No Does the patient have difficulty concentrating, remembering, or making decisions?: No Patient able to express need for assistance with ADLs?: Yes Does the patient have difficulty dressing or bathing?: No Independently performs ADLs?: Yes (appropriate for developmental age) Does the patient have difficulty walking or climbing stairs?: No Weakness of  Legs: None Weakness of Arms/Hands: None  Permission Sought/Granted                  Emotional Assessment              Admission diagnosis:  Acute respiratory failure due to COVID-19 ([place]) [U07.1, J96.00] Patient Active Problem List   Diagnosis Date Noted  . Pneumonia due to COVID-19 virus 01/25/2019  . 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 Abnormal nuclear cardiac imaging test   . Pain in the chest   . Abnormal nuclear stress test 01/03/2015  . Heme positive stool 02/16/2013   PCP:  Monico Blitz, MD Pharmacy:   [facility] [address], [place] [address] Phone: [phone] Fax: [phone]     Social Determinants of Health (SDOH) Interventions    Readmission Risk Interventions No flowsheet data found.

## 2019-02-06 NOTE — Progress Notes (Signed)
Occupational Therapy Treatment Patient Details Name: Nathan Perkins MRN: [id number] DOB: 03-05-1956 Today's Date: 02/06/2019    History of present illness is a 63 y.o. male with PMHx of insulin-dependent DM-2, HTN presented  to ER with 1 week history of cough, fever, myalgias and worsening shortness of breath.  Patient was found to have acute hypoxic respiratory failure in setting of COVID-19 pneumonia .Admitted 01/24/19   OT comments  Pt on 1L sitting with SpO2 @ 91. Pt completed grooming task at sink then ambulated in room using rollator on 3L with SpO2 85. Without 3L, pt desats into 70s. Pt anxious about having a "coughing fit" while walking - rollator appeared to help with anxiety, knowing he could sit and rest. Pt walked @ 50 ft then had a seated rest break. Using flutter valve to help slow respiratory rate and control "coughing fit". Rollator left in room and nsg asked to ambulate pt again this evening. Will continue to follow. Pt voices concerns about going home "too soon".   Follow Up Recommendations  No OT follow up;Supervision/Assistance - 24 hour    Equipment Recommendations  3 in 1 bedside commode    Recommendations for Other Services      Precautions / Restrictions Precautions Precautions: Fall Precaution Comments: probe on earlobe       Mobility Bed Mobility                  Transfers Overall transfer level: Needs assistance   Transfers: Sit to/from Stand Sit to Stand: Supervision              Balance                                           ADL either performed or assessed with clinical judgement   ADL Overall ADL's : Needs assistance/impaired     Grooming: Set up;Standing       Lower Body Bathing: Minimal assistance Lower Body Bathing Details (indicate cue type and reason): difficulty with reaching feet; causes increased coughing         Toilet Transfer: Supervision/safety           Functional mobility  during ADLs: Supervision/safety General ADL Comments: Used rollator in room to take breaks. Pt  anxious about standing and having a coughing spell. Being able to use rollator appeared to help with his anxiey knowing he could sit down when fatigued.      Vision       Perception     Praxis      Cognition Arousal/Alertness: Awake/alert Behavior During Therapy: WFL for tasks assessed/performed Overall Cognitive Status: Within Functional Limits for tasks assessed                                          Exercises Exercises: Other exercises Other Exercises Other Exercises: worked on incentive spirometer and flutter valve Other Exercises: flutter valve x 10   Shoulder Instructions       General Comments Ambulated @ 50 ft before needed to take a break Repeated x 2    Pertinent Vitals/ Pain       Pain Assessment: Faces Faces Pain Scale: Hurts a little bit Pain Location: back Pain Descriptors / Indicators: Discomfort Pain Intervention(s): Limited activity within patient's tolerance  Home Living                                          Prior Functioning/Environment              Frequency  Min 3X/week        Progress Toward Goals  OT Goals(current goals can now be found in the care plan section)  Progress towards OT goals: Progressing toward goals  Acute Rehab OT Goals Patient Stated Goal: to go home OT Goal Formulation: With patient Time For Goal Achievement: 02/12/19 Potential to Achieve Goals: Good ADL Goals Pt Will Perform Grooming: standing;Independently Pt Will Perform Lower Body Dressing: sit to/from stand;Independently Pt Will Transfer to Toilet: ambulating;regular height toilet;Independently Pt Will Perform Toileting - Clothing Manipulation and hygiene: Independently;sit to/from stand;sitting/lateral leans Pt Will Perform Tub/Shower Transfer: Shower transfer;3 in 1;ambulating;Independently Additional ADL Goal #1: Pt  will independently verbalize three energy conservation strategies for ADLs/IADLs  Plan Discharge plan remains appropriate    Co-evaluation                 AM-PAC OT "6 Clicks" Daily Activity     Outcome Measure   Help from another person eating meals?: None Help from another person taking care of personal grooming?: A Little Help from another person toileting, which includes using toliet, bedpan, or urinal?: A Little Help from another person bathing (including washing, rinsing, drying)?: A Little Help from another person to put on and taking off regular upper body clothing?: A Little Help from another person to put on and taking off regular lower body clothing?: A Little 6 Click Score: 19    End of Session Equipment Utilized During Treatment: Oxygen(ambulated on 3L)  OT Visit Diagnosis: Unsteadiness on feet (R26.81);Other abnormalities of gait and mobility (R26.89);Muscle weakness (generalized) (M62.81)   Activity Tolerance Patient tolerated treatment well   Patient Left in chair;with call bell/phone within reach   Nurse Communication Mobility status        Time: 1545-1620 OT Time Calculation (min): 35 min  Charges: OT General Charges $OT Visit: 1 Visit OT Treatments $Self Care/Home Management : 23-37 mins    Kohlton Gilpatrick,HILLARY 02/06/2019, 4:50 PM  Maurie Boettcher, OT/L   Acute OT Clinical Specialist [place] Pager [phone] Office [phone]

## 2019-02-06 NOTE — Progress Notes (Signed)
PROGRESS NOTE                                                                                                                                                                                                             Patient Demographics:    Nathan Perkins, is a 63 y.o. male, DOB - 1955/07/02, EEF:[id number]  Outpatient Primary MD for the patient is Monico Blitz, MD   Admit date - 01/24/2019   LOS - 15  Chief Complaint  Patient presents with   Shortness of Breath       Brief Narrative: Patient is a 63 y.o. male with PMHx of insulin-dependent DM-2, HTN presented with 1 week history of cough, fever, myalgias and worsening shortness of breath.  Patient was found to have acute hypoxic respiratory failure in setting of COVID-19 pneumonia at [facility] course was complicated by rapidly worsening respiratory failure in spite of being treated with steroids, remdesivir and Actemra.  See below for further details.   Subjective:   Patient in bed, appears comfortable, denies any headache, no fever, no chest pain or pressure, no shortness of breath , no abdominal pain. No focal weakness.    Assessment  & Plan :   Acute Hypoxic Resp Failure due to Covid 19 Viral pneumonia: he has severe COVID-19 pneumonitis and acute hypoxic respiratory failure, now on 2 - 3 lit [place] oxygen down from 15 L high flow nasal cannula oxygen, he is now in no distress but remains a little anxious.   He has so far received full treatment including IV steroids, remdesivir, Actemra, convalescent plasma.  His inflammatory markers are finally trending down and he appears to have stabilized except for some ongoing anxiety.  Continue to taper down steroids.  Prepare for discharge in the next 1 to 2 days.  He and his wife has been told that he might go home on a low-dose oxygen if needed.     COVID-19 Labs:  Recent Labs    02/04/19 0004  02/05/19 0008 02/06/19 0535  DDIMER 10.77* 3.85* 2.70*  CRP 0.9 0.8 <0.8    Lab Results  Component Value Date   SARSCOV2NAA POSITIVE (A) 01/24/2019    Hepatic Function Latest Ref Rng & Units 02/06/2019 02/05/2019 02/04/2019  Total Protein 6.5 - 8.1 g/dL 5.5(L) 5.6(L) 5.8(L)  Albumin 3.5 - 5.0 g/dL  3.2(L) 3.1(L) 3.1(L)  AST 15 - 41 U/L _0 ALT 0 - 44 U/L 33 35 38  Alk Phosphatase 38 - 126 U/L 47 55 55  Total Bilirubin 0.3 - 1.2 mg/dL 0.7 0.8 0.5    COVID-19 Medications:  Steroids: 10/20>> Remdesivir: 10/21>>10/26 Actemra:10/21 x 1 Convalescent Plasma: 10/22   Transaminitis.  Likely due to combination of COVID-19 infection and Remdesivir.  Finishing remdesivir soon, symptom-free will monitor trend.    Rising D-dimer .  Lovenox on 24th of this month was accidentally discontinued as linked order with daily serum creatinine when serum creatinine was discontinued.  Lovenox resumed, CTA & Leg [place] both negative.  D-dimer much improved.  HTN: BP relatively controlled-continue amlodipine and follow.  Extreme anxiety.  Added Klonopin twice daily will monitor.  Hold for sedation.  Leukocytosis - likely steroid related will monitor closely.  No productive cough, no dysuria, no fever.    Insulin-dependent DM-2: Insulin adjusted 02/06/19 - labile sugars.  Lab Results  Component Value Date   HGBA1C 8.6 (H) 01/24/2019   CBG (last 3)  Recent Labs    02/05/19 1638 02/05/19 2020 02/06/19 0708  GLUCAP 158* 283* 79     Condition -  Fair  Family Communication  : Discussed with wife on 01/28/2019, 01/31/19.  Code Status :  Full Code  Diet :  Diet Order            Diet Carb Modified Fluid consistency: Thin; Room service appropriate? Yes  Diet effective now               Disposition Plan  : Discharge in am    Barriers to discharge: Hypoxemia  Consults  :  None  Procedures  :    Lower extremity venous duplex.  CTA.  PE protocol. - No PE.   Antibiotics  :     Anti-infectives (From admission, onward)   Start     Dose/Rate Route Frequency Ordered Stop   01/26/19 1600  remdesivir 100 mg in sodium chloride 0.9 % 250 mL IVPB     100 mg 500 mL/hr over 30 Minutes Intravenous Every 24 hours 01/25/19 1445 01/29/19 1720   01/25/19 1600  remdesivir 200 mg in sodium chloride 0.9 % 250 mL IVPB     200 mg 500 mL/hr over 30 Minutes Intravenous Once 01/25/19 1445 01/25/19 1727      Inpatient Medications  Scheduled Meds:  albuterol  2 puff Inhalation Q6H   amLODipine  5 mg Oral Daily   aspirin  81 mg Oral Daily   benzonatate  200 mg Oral TID   clonazepam  0.5 mg Oral BID   enoxaparin (LOVENOX) injection  60 mg Subcutaneous Q24H   guaiFENesin  600 mg Oral BID   insulin aspart  0-5 Units Subcutaneous QHS   insulin aspart  0-9 Units Subcutaneous TID WC   insulin aspart  3 Units Subcutaneous TID WC   insulin detemir  20 Units Subcutaneous BID   methylPREDNISolone (SOLU-MEDROL) injection  20 mg Intravenous Daily   pantoprazole  40 mg Oral Daily   vitamin C  1,000 mg Oral Daily   zinc sulfate  220 mg Oral Daily   Continuous Infusions:  PRN Meds:.labetalol, [DISCONTINUED] ondansetron **OR** ondansetron (ZOFRAN) IV, sodium chloride, traMADol   Time Spent in minutes  35  See all Orders from today for further details   Lala Lund M.D on 02/06/2019 at 9:24 AM  To page go to [email or web address] - use universal  password  [facility] -  Office  [phone]    Objective:   Vitals:   02/06/19 0408 02/06/19 0711 02/06/19 0715 02/06/19 0906  BP: 129/81 121/89  125/70  Pulse: 70 91    Resp: 18     Temp: 97.9 F (36.6 C) 98 F (36.7 C)    TempSrc:  Oral    SpO2: 92% 91% 92% 95%  Weight:      Height:        Wt Readings from Last 3 Encounters:  01/25/19 90.1 kg  07/30/17 90.7 kg  01/07/15 92.5 kg     Intake/Output Summary (Last 24 hours) at 02/06/2019 0924 Last data filed at 02/06/2019 0400 Gross per 24 hour  Intake  1200 ml  Output 1450 ml  Net -250 ml     Physical Exam  Awake Alert, Oriented X 3, No new F.N deficits, Normal affect [place].AT,PERRAL Supple Neck,No JVD, No cervical lymphadenopathy appriciated.  Symmetrical Chest wall movement, Good air movement bilaterally, CTAB RRR,No Gallops, Rubs or new Murmurs, No Parasternal Heave +ve B.Sounds, Abd Soft, No tenderness, No organomegaly appriciated, No rebound - guarding or rigidity. No Cyanosis, Clubbing or edema, No new Rash or bruise    Data Review:    CBC Recent Labs  Lab 02/02/19 0200 02/03/19 0050 02/04/19 0004 02/05/19 0008 02/06/19 0535  WBC 22.9* 14.7* 16.3* 18.1* 14.5*  HGB 15.1 14.7 15.0 14.6 13.9  HCT 45.4 44.4 44.8 44.0 42.9  PLT 576* 393 391 385 347  MCV 80.5 81.3 80.9 82.1 83.8  MCH 26.8 26.9 27.1 27.2 27.1  MCHC 33.3 33.1 33.5 33.2 32.4  RDW 12.1 12.3 12.0 12.1 12.6  LYMPHSABS 2.6 1.1 1.1 0.9 1.0  MONOABS 2.3* 1.1* 1.2* 1.0 1.1*  EOSABS 0.3 0.3 0.4 0.2 0.2  BASOSABS 0.1 0.1 0.1 0.1 0.0    Chemistries  Recent Labs  Lab 02/02/19 0200 02/03/19 0050 02/04/19 0004 02/05/19 0008 02/06/19 0535  NA 133* 133* 132* 133* 137  K 4.5 4.4 4.3 3.9 4.4  CL 99 97* 95* 93* 98  CO2 _0 GLUCOSE 39* 167* 206* 147* 75  BUN 26* 22 25* 21 22  CREATININE 0.78 0.76 0.80 0.70 0.90  CALCIUM 8.6* 8.5* 8.7* 8.8* 8.8*  MG 2.1 2.0 2.2 2.2 2.2  AST _1 ALT 57* 45* 38 35 33  ALKPHOS 46 49 55 55 47  BILITOT 0.7 0.7 0.5 0.8 0.7   ------------------------------------------------------------------------------------------------------------------ No results for input(s): CHOL, HDL, LDLCALC, TRIG, CHOLHDL, LDLDIRECT in the last 72 hours.  Lab Results  Component Value Date   HGBA1C 8.6 (H) 01/24/2019   ------------------------------------------------------------------------------------------------------------------ No results for input(s): TSH, T4TOTAL, T3FREE, THYROIDAB in the last 72 hours.  Invalid  input(s): FREET3 ------------------------------------------------------------------------------------------------------------------ No results for input(s): VITAMINB12, FOLATE, FERRITIN, TIBC, IRON, RETICCTPCT in the last 72 hours.  Coagulation profile No results for input(s): INR, PROTIME in the last 168 hours.  Recent Labs    02/05/19 0008 02/06/19 0535  DDIMER 3.85* 2.70*    Cardiac Enzymes No results for input(s): CKMB, TROPONINI, MYOGLOBIN in the last 168 hours.  Invalid input(s): CK ------------------------------------------------------------------------------------------------------------------    Component Value Date/Time   BNP 41.0 02/04/2019 0004    Micro Results No results found for this or any previous visit (from the past 240 hour(s)).  Radiology Reports Ct Angio Chest Pe W Or Wo Contrast  Result Date: 02/04/2019 CLINICAL DATA:  Positive D-dimer.  PE suspected. EXAM: CT ANGIOGRAPHY CHEST WITH  CONTRAST TECHNIQUE: Multidetector CT imaging of the chest was performed using the standard protocol during bolus administration of intravenous contrast. Multiplanar CT image reconstructions and MIPs were obtained to evaluate the vascular anatomy. CONTRAST:  119m OMNIPAQUE IOHEXOL 350 MG/ML SOLN COMPARISON:  Chest radiograph, most recent 01/26/2019. FINDINGS: Cardiovascular: There is suboptimal opacification of the central pulmonary arteries and the mid to upper lung pulmonary arteries. There is dense opacification of pulmonary arteries to the lower lobes and right middle lobe and portions of the left upper lobe lingula. Where the pulmonary arteries are well opacified, there is no evidence of a pulmonary embolism. No convincing central pulmonary embolus. Upper lobe pulmonary emboli cannot be excluded. Heart is normal in size and configuration. No pericardial effusion. No coronary artery calcifications. Great vessels are normal in caliber. No aortic dissection or atherosclerosis.  Mediastinum/Nodes: No neck base, axillary, mediastinal or hilar masses or enlarged lymph nodes. Trachea is widely patent. Esophagus mildly distended with air but otherwise unremarkable. Lungs/Pleura: Patchy bilateral airspace lung opacities some ground-glass and others more confluent, seen throughout both lungs. Small cystic spaces are also noted primarily in the upper lobes. There is also mild upper lobe paraseptal emphysema. No pleural effusion.  No pneumothorax. Upper Abdomen: No acute abnormality. Musculoskeletal: No chest wall abnormality. No acute or significant osseous findings. Review of the MIP images confirms the above findings. IMPRESSION: 1. Suboptimal study. Contrast opacifies the lower lung pulmonary arteries, but there is no opacification in the upper lung pulmonary arteries in limited central pulmonary artery opacification. Where the arteries are opacified, there is no evidence of a pulmonary embolism. 2. Bilateral confluent and ground-glass airspace lung opacities with a peripheral distribution, consistent with multifocal pneumonia. Patient has had a positive coronavirus test. The pattern of pneumonia is consistent with coronavirus infection. 3. Mild underlying centrilobular and paraseptal emphysema. Electronically Signed   By: DLajean ManesM.D.   On: 02/04/2019 11:17   Dg Chest Port 1 View  Result Date: 01/24/2019 CLINICAL DATA:  Difficulty breathing, concern for COVID-19 EXAM: PORTABLE CHEST 1 VIEW COMPARISON:  Radiograph 01/23/2019 FINDINGS: Worsening bilateral interstitial and airspace opacities throughout both lungs. Peripheral predominance most notable in the left lung base. Lung volumes are diminished with streaky areas of atelectasis as well. No pneumothorax or visible effusion. Cardiomediastinal contours are stable accounting for differences in technique and lung volumes. Degenerative changes are present in the imaged spine and shoulders. Cervical spinal fusion hardware is  incompletely assessed. IMPRESSION: Worsening bilateral interstitial and airspace opacities throughout both lungs, compatible with a multifocal pneumonia such as atypical viral pneumonia including COVID-19. Electronically Signed   By: PLovena LeM.D.   On: 01/24/2019 22:17   Dg Chest Port 1v Same Day  Result Date: 01/26/2019 CLINICAL DATA:  Shortness of breath EXAM: PORTABLE CHEST 1 VIEW COMPARISON:  01/24/2019 FINDINGS: Heart is borderline enlarged. There diffuse bilateral airspace opacities noted with some peripheral predominance. This is worsened since prior study. Low lung volumes. No visible effusions. IMPRESSION: Cardiomegaly. Patchy bilateral airspace opacities with peripheral predominance. This is worsened since prior study concerning for multifocal pneumonia. Electronically Signed   By: KRolm BaptiseM.D.   On: 01/26/2019 09:14   Vas [place]Lower Extremity Venous (dvt)  Result Date: 02/05/2019  Lower Venous Study Indications: Elevated Ddimer.  Risk Factors: COVID 19 positive. Comparison Study: No prior studies. Performing Technologist: GOliver HumRVT  Examination Guidelines: A complete evaluation includes B-mode imaging, spectral Doppler, color Doppler, and power Doppler as needed of all accessible portions  of each vessel. Bilateral testing is considered an integral part of a complete examination. Limited examinations for reoccurring indications may be performed as noted.  +---------+---------------+---------+-----------+----------+--------------+  RIGHT     Compressibility Phasicity Spontaneity Properties Thrombus Aging  +---------+---------------+---------+-----------+----------+--------------+  CFV       Full            Yes       Yes                                    +---------+---------------+---------+-----------+----------+--------------+  SFJ       Full                                                             +---------+---------------+---------+-----------+----------+--------------+   FV Prox   Full                                                             +---------+---------------+---------+-----------+----------+--------------+  FV Mid    Full                                                             +---------+---------------+---------+-----------+----------+--------------+  FV Distal Full                                                             +---------+---------------+---------+-----------+----------+--------------+  PFV       Full                                                             +---------+---------------+---------+-----------+----------+--------------+  POP       Full            Yes       Yes                                    +---------+---------------+---------+-----------+----------+--------------+  PTV       Full                                                             +---------+---------------+---------+-----------+----------+--------------+  PERO      Full                                                             +---------+---------------+---------+-----------+----------+--------------+   +---------+---------------+---------+-----------+----------+--------------+  LEFT      Compressibility Phasicity Spontaneity Properties Thrombus Aging  +---------+---------------+---------+-----------+----------+--------------+  CFV       Full            Yes       Yes                                    +---------+---------------+---------+-----------+----------+--------------+  SFJ       Full                                                             +---------+---------------+---------+-----------+----------+--------------+  FV Prox   Full                                                             +---------+---------------+---------+-----------+----------+--------------+  FV Mid    Full                                                             +---------+---------------+---------+-----------+----------+--------------+  FV Distal Full                                                              +---------+---------------+---------+-----------+----------+--------------+  PFV       Full                                                             +---------+---------------+---------+-----------+----------+--------------+  POP       Full            Yes       Yes                                    +---------+---------------+---------+-----------+----------+--------------+  PTV       Full                                                             +---------+---------------+---------+-----------+----------+--------------+  PERO      Full                                                             +---------+---------------+---------+-----------+----------+--------------+  Summary: Right: There is no evidence of deep vein thrombosis in the lower extremity. No cystic structure found in the popliteal fossa. Left: There is no evidence of deep vein thrombosis in the lower extremity. No cystic structure found in the popliteal fossa.  *See table(s) above for measurements and observations. Electronically signed by Deitra Mayo MD on 02/05/2019 at 11:31:13 AM.    Final

## 2019-02-07 LAB — CBC WITH DIFFERENTIAL/PLATELET
Abs Immature Granulocytes: 0.22 10*3/uL — ABNORMAL HIGH (ref 0.00–0.07)
Basophils Absolute: 0 10*3/uL (ref 0.0–0.1)
Basophils Relative: 0 %
Eosinophils Absolute: 0.1 10*3/uL (ref 0.0–0.5)
Eosinophils Relative: 1 %
HCT: 41.7 % (ref 39.0–52.0)
Hemoglobin: 13.6 g/dL (ref 13.0–17.0)
Immature Granulocytes: 2 %
Lymphocytes Relative: 6 %
Lymphs Abs: 0.7 10*3/uL (ref 0.7–4.0)
MCH: 27.3 pg (ref 26.0–34.0)
MCHC: 32.6 g/dL (ref 30.0–36.0)
MCV: 83.6 fL (ref 80.0–100.0)
Monocytes Absolute: 0.8 10*3/uL (ref 0.1–1.0)
Monocytes Relative: 6 %
Neutro Abs: 10.7 10*3/uL — ABNORMAL HIGH (ref 1.7–7.7)
Neutrophils Relative %: 85 %
Platelets: 312 10*3/uL (ref 150–400)
RBC: 4.99 MIL/uL (ref 4.22–5.81)
RDW: 12.6 % (ref 11.5–15.5)
WBC: 12.5 10*3/uL — ABNORMAL HIGH (ref 4.0–10.5)
nRBC: 0 % (ref 0.0–0.2)

## 2019-02-07 LAB — GLUCOSE, CAPILLARY
Glucose-Capillary: 256 mg/dL — ABNORMAL HIGH (ref 70–99)
Glucose-Capillary: 325 mg/dL — ABNORMAL HIGH (ref 70–99)

## 2019-02-07 LAB — COMPREHENSIVE METABOLIC PANEL
ALT: 36 U/L (ref 0–44)
AST: 19 U/L (ref 15–41)
Albumin: 3 g/dL — ABNORMAL LOW (ref 3.5–5.0)
Alkaline Phosphatase: 49 U/L (ref 38–126)
Anion gap: 9 (ref 5–15)
BUN: 26 mg/dL — ABNORMAL HIGH (ref 8–23)
CO2: 25 mmol/L (ref 22–32)
Calcium: 8.7 mg/dL — ABNORMAL LOW (ref 8.9–10.3)
Chloride: 99 mmol/L (ref 98–111)
Creatinine, Ser: 0.86 mg/dL (ref 0.61–1.24)
GFR calc Af Amer: >60 mL/min (ref >60)
GFR calc non Af Amer: >60 mL/min (ref >60)
Glucose, Bld: 277 mg/dL — ABNORMAL HIGH (ref 70–99)
Potassium: 4.4 mmol/L (ref 3.5–5.1)
Sodium: 133 mmol/L — ABNORMAL LOW (ref 135–145)
Total Bilirubin: 0.8 mg/dL (ref 0.3–1.2)
Total Protein: 5.6 g/dL — ABNORMAL LOW (ref 6.5–8.1)

## 2019-02-07 LAB — D-DIMER, QUANTITATIVE: D-Dimer, Quant: 1.9 ug/mL-FEU — ABNORMAL HIGH (ref 0.00–0.50)

## 2019-02-07 MED ORDER — METHYLPREDNISOLONE 4 MG PO TBPK: ORAL_TABLET | ORAL | 0 refills | Status: DC

## 2019-02-07 MED ORDER — HYDROCOD POLST-CPM POLST ER 10-8 MG/5ML PO SUER: 5.0000 mL | Freq: Two times a day (BID) | ORAL | 0 refills | Status: DC

## 2019-02-07 MED ORDER — PANTOPRAZOLE SODIUM 40 MG PO TBEC: 40.0000 mg | DELAYED_RELEASE_TABLET | Freq: Every day | ORAL | 0 refills | Status: DC

## 2019-02-07 MED ORDER — METHYLPREDNISOLONE SODIUM SUCC 40 MG IJ SOLR
5.0000 mg | Freq: Every day | INTRAMUSCULAR | Status: DC
  Administered 2019-02-07: 5.2 mg via INTRAVENOUS
  Filled 2019-02-07: qty 1

## 2019-02-07 MED ORDER — CLONAZEPAM 0.5 MG PO TBDP: 0.5000 mg | ORAL_TABLET | Freq: Two times a day (BID) | ORAL | 0 refills | Status: DC

## 2019-02-07 MED ORDER — ALBUTEROL SULFATE HFA 108 (90 BASE) MCG/ACT IN AERS: 2.0000 | INHALATION_SPRAY | Freq: Four times a day (QID) | RESPIRATORY_TRACT | 0 refills | Status: DC | PRN

## 2019-02-07 MED ORDER — ASPIRIN EC 325 MG PO TBEC: 325.0000 mg | DELAYED_RELEASE_TABLET | Freq: Every day | ORAL | 0 refills | Status: AC

## 2019-02-07 MED ORDER — "INSULIN SYRINGE-NEEDLE U-100 25G X 1"" 1 ML MISC": 0 refills | Status: AC

## 2019-02-07 MED ORDER — INSULIN GLARGINE 100 UNIT/ML ~~LOC~~ SOLN: 20.0000 [IU] | Freq: Every day | SUBCUTANEOUS | 0 refills | Status: DC

## 2019-02-07 NOTE — Discharge Summary (Addendum)
Nathan Perkins PPJ:[id number] DOB: [birth year] DOA: 01/24/2019  PCP: Monico Blitz, MD  Admit date: 01/24/2019  Discharge date: 02/07/2019  Admitted From: Home   Disposition:  Home   Recommendations for Outpatient Follow-up:   Follow up with PCP in 1-2 weeks  PCP Please obtain BMP/CBC, 2 view CXR in 1week,  (see Discharge instructions)   PCP Please follow up on the following pending results:    Home Health: PT, RN   Equipment/Devices: 2lit o2  Consultations: None  Discharge Condition: Stable    CODE STATUS: Full    Diet Recommendation: Heart Healthy Low Carb     Chief Complaint  Patient presents with   Shortness of Breath     Brief history of present illness from the day of admission and additional interim summary    Patient is a 63 y.o. male with PMHx of insulin-dependent DM-2, HTN presented with 1 week history of cough, fever, myalgias and worsening shortness of breath.  Patient was found to have acute hypoxic respiratory failure in setting of COVID-19 pneumonia at [facility] course was complicated by rapidly worsening respiratory failure in spite of being treated with steroids, remdesivir and Actemra.  See below for further details.                                                                 Hospital Course    Acute Hypoxic Resp Failure due to Covid 19 Viral pneumonia: he has severe COVID-19 pneumonitis and acute hypoxic respiratory failure, he was appropriately treated with full treatment which included IV steroids, remdesivir, Actemra along with convalescent plasma.   Now on 1 lit [facility] oxygen down from 15 L high flow nasal cannula oxygen, he is now in no distress and completely symptom-free.  Will be discharged home on low-dose steroid taper along with 2 L nasal cannula oxygen  as needed with PCP follow-up.   COVID-19 Labs  Recent Labs    02/05/19 0008 02/06/19 0535 02/07/19 0134  DDIMER 3.85* 2.70* 1.90*  CRP 0.8 <0.8  --     Lab Results  Component Value Date   SARSCOV2NAA POSITIVE (A) 01/24/2019   COVID-19 Medications:  Steroids: 10/20>> Remdesivir: 10/21>>10/26 Actemra:10/21 x 1 Convalescent Plasma: 10/22  Hepatic Function Latest Ref Rng & Units 02/07/2019 02/06/2019 02/05/2019  Total Protein 6.5 - 8.1 g/dL 5.6(L) 5.5(L) 5.6(L)  Albumin 3.5 - 5.0 g/dL 3.0(L) 3.2(L) 3.1(L)  AST 15 - 41 U/L '19 20 18  '$ ALT 0 - 44 U/L 36 33 35  Alk Phosphatase 38 - 126 U/L 49 47 55  Total Bilirubin 0.3 - 1.2 mg/dL 0.8 0.7 0.8    Transaminitis.  Likely due to combination of COVID-19 infection and Remdesivir.    Now close to normal.  PCP to repeat CMP in 7 to 10  days.  Rising D-dimer .    Much improved and almost back to normal with high-dose Lovenox, CTA & Leg [place] both negative.  Increased aspirin to full dose for 2 weeks per discharge thereafter back to 81 mg of aspirin as before.  HTN: BP relatively controlled-continue amlodipine and follow.  Extreme anxiety.  Added Klonopin twice daily will monitor.  Hold for sedation.  Leukocytosis - steroid related.  No productive cough, no dysuria, no fever.    Severe anxiety.  Stable on Klonopin.  PCP to arrange for outpatient psych follow-up.  1 week supply provided of Klonopin.    Insulin-dependent DM-2: Poor outpatient control due to hyperglycemia A1c 8.6, home dose insulin continued will add low-dose Lantus/Levemir as well.  PCP to monitor and adjust .  Lab Results  Component Value Date   HGBA1C 8.6 (H) 01/24/2019   CBG (last 3)  Recent Labs    02/06/19 1624 02/06/19 2032 02/07/19 0707  GLUCAP 281* 389* 256*     Discharge diagnosis     Active Problems:   Pneumonia due to COVID-19 virus    Discharge instructions    Discharge Instructions    Discharge instructions   Complete by: As directed     Follow with Primary MD Monico Blitz, MD in 7 days   Get CBC, CMP, 2 view Chest X ray -  checked next visit within 1 week by Primary MD    Activity: As tolerated with Full fall precautions use walker/cane & assistance as needed  Disposition Home    Diet: Heart Healthy Low Carb  Accuchecks 4 times/day, Once in AM empty stomach and then before each meal. Log in all results and show them to your Prim.MD in 3 days. If any glucose reading is under 80 or above 300 call your Prim MD immidiately. Follow Low glucose instructions for glucose under 80 as instructed.   Special Instructions: If you have smoked or chewed Tobacco  in the last 2 yrs please stop smoking, stop any regular Alcohol  and or any Recreational drug use.  On your next visit with your primary care physician please Get Medicines reviewed and adjusted.  Please request your Prim.MD to go over all Hospital Tests and Procedure/Radiological results at the follow up, please get all Hospital records sent to your Prim MD by signing hospital release before you go home.  If you experience worsening of your admission symptoms, develop shortness of breath, life threatening emergency, suicidal or homicidal thoughts you must seek medical attention immediately by calling 911 or calling your MD immediately  if symptoms less severe.  You Must read complete instructions/literature along with all the possible adverse reactions/side effects for all the Medicines you take and that have been prescribed to you. Take any new Medicines after you have completely understood and accpet all the possible adverse reactions/side effects.   Increase activity slowly   Complete by: As directed    MyChart COVID-19 home monitoring program   Complete by: Feb 07, 2019    Is the patient willing to use the [place] for home monitoring?: Yes   Temperature monitoring   Complete by: Feb 07, 2019    After how many days would you like to receive a notification  of this patient's flowsheet entries?: 1      Discharge Medications   Allergies as of 02/07/2019      Reactions   Invokana [canagliflozin] Other (See Comments)   KETOACIDOSIS    Percocet [oxycodone-acetaminophen] Itching  Shellfish Allergy    Shrimp - possible Iodine allergy UNSPECIFIED REACTION    Sulfa Antibiotics    UNSPECIFIED REACTION       Medication List    STOP taking these medications   aspirin 81 MG tablet Replaced by: aspirin EC 325 MG tablet   ibuprofen 200 MG tablet Commonly known as: ADVIL   omeprazole 20 MG capsule Commonly known as: PRILOSEC     TAKE these medications   albuterol 108 (90 Base) MCG/ACT inhaler Commonly known as: VENTOLIN HFA Inhale 2 puffs into the lungs every 6 (six) hours as needed for wheezing or shortness of breath.   amLODipine 5 MG tablet Commonly known as: NORVASC Take 5 mg by mouth daily.   aspirin EC 325 MG tablet Take 1 tablet (325 mg total) by mouth daily for 14 days. Replaces: aspirin 81 MG tablet   chlorpheniramine-HYDROcodone 10-8 MG/5ML Suer Commonly known as: TUSSIONEX Take 5 mLs by mouth every 12 (twelve) hours.   clonazePAM 0.5 MG disintegrating tablet Commonly known as: KLONOPIN Take 1 tablet (0.5 mg total) by mouth 2 (two) times daily.   Flaxseed Oil 1200 MG Caps Take 1 capsule by mouth daily.   HumaLOG KwikPen 100 UNIT/ML KwikPen Generic drug: insulin lispro Inject 4-10 Units into the muscle 3 (three) times daily as needed (Sliding scale).   insulin glargine 100 UNIT/ML injection Commonly known as: Lantus Inject 0.2 mLs (20 Units total) into the skin at bedtime. Can switch to Levemir if needed . Dispense insulin pen if approved, if not dispense as needed syringes and needles for 1 month supply. Diagnosis E 11.65.   Insulin Syringe-Needle U-100 25G X 1" 1 ML Misc For 4 times a day insulin SQ, 1 month supply. Diagnosis E11.65   lisinopril 40 MG tablet Commonly known as: ZESTRIL Take 40 mg by mouth  daily.   methylPREDNISolone 4 MG Tbpk tablet Commonly known as: MEDROL DOSEPAK follow package directions   NovoLIN N ReliOn 100 UNIT/ML injection Generic drug: insulin NPH Human Inject 30-40 Units into the skin See admin instructions. 40 units in the morning, and 40 units in the evening (evening dose depends on blood sugar levels)   pantoprazole 40 MG tablet Commonly known as: Protonix Take 1 tablet (40 mg total) by mouth daily.   Vitamin D (Ergocalciferol) 1.25 MG (50000 UT) Caps capsule Commonly known as: DRISDOL Take 1 capsule by mouth every 'Sunday.            Durable Medical Equipment  (From admission, onward)         Start     Ordered   02/03/19 1150  For home use only DME oxygen  Once    Question Answer Comment  Length of Need 6 Months   Mode or (Route) Nasal cannula   Liters per Minute 3   Frequency Continuous (stationary and portable oxygen unit needed)   Oxygen conserving device Yes   Oxygen delivery system Gas      10'$ /30/20 [place] [facility] Follow up.   Why: For home oxygen Contact information: [place] [address] [phone]        Monico Blitz, MD. Schedule an appointment as soon as possible for a visit in 1 week(s).   Specialty: Internal Medicine Contact information: [address]  [place] [address] [phone]           Major procedures and Radiology Reports -  PLEASE review detailed and final reports thoroughly  -         Ct Angio Chest Pe W Or Wo Contrast  Result Date: 02/04/2019 CLINICAL DATA:  Positive D-dimer.  PE suspected. EXAM: CT ANGIOGRAPHY CHEST WITH CONTRAST TECHNIQUE: Multidetector CT imaging of the chest was performed using the standard protocol during bolus administration of intravenous contrast. Multiplanar CT image reconstructions and MIPs were obtained to evaluate the vascular anatomy. CONTRAST:  123m OMNIPAQUE IOHEXOL 350 MG/ML SOLN COMPARISON:   Chest radiograph, most recent 01/26/2019. FINDINGS: Cardiovascular: There is suboptimal opacification of the central pulmonary arteries and the mid to upper lung pulmonary arteries. There is dense opacification of pulmonary arteries to the lower lobes and right middle lobe and portions of the left upper lobe lingula. Where the pulmonary arteries are well opacified, there is no evidence of a pulmonary embolism. No convincing central pulmonary embolus. Upper lobe pulmonary emboli cannot be excluded. Heart is normal in size and configuration. No pericardial effusion. No coronary artery calcifications. Great vessels are normal in caliber. No aortic dissection or atherosclerosis. Mediastinum/Nodes: No neck base, axillary, mediastinal or hilar masses or enlarged lymph nodes. Trachea is widely patent. Esophagus mildly distended with air but otherwise unremarkable. Lungs/Pleura: Patchy bilateral airspace lung opacities some ground-glass and others more confluent, seen throughout both lungs. Small cystic spaces are also noted primarily in the upper lobes. There is also mild upper lobe paraseptal emphysema. No pleural effusion.  No pneumothorax. Upper Abdomen: No acute abnormality. Musculoskeletal: No chest wall abnormality. No acute or significant osseous findings. Review of the MIP images confirms the above findings. IMPRESSION: 1. Suboptimal study. Contrast opacifies the lower lung pulmonary arteries, but there is no opacification in the upper lung pulmonary arteries in limited central pulmonary artery opacification. Where the arteries are opacified, there is no evidence of a pulmonary embolism. 2. Bilateral confluent and ground-glass airspace lung opacities with a peripheral distribution, consistent with multifocal pneumonia. Patient has had a positive coronavirus test. The pattern of pneumonia is consistent with coronavirus infection. 3. Mild underlying centrilobular and paraseptal emphysema. Electronically Signed   By:  DLajean ManesM.D.   On: 02/04/2019 11:17   Dg Chest Port 1 View  Result Date: 01/24/2019 CLINICAL DATA:  Difficulty breathing, concern for COVID-19 EXAM: PORTABLE CHEST 1 VIEW COMPARISON:  Radiograph 01/23/2019 FINDINGS: Worsening bilateral interstitial and airspace opacities throughout both lungs. Peripheral predominance most notable in the left lung base. Lung volumes are diminished with streaky areas of atelectasis as well. No pneumothorax or visible effusion. Cardiomediastinal contours are stable accounting for differences in technique and lung volumes. Degenerative changes are present in the imaged spine and shoulders. Cervical spinal fusion hardware is incompletely assessed. IMPRESSION: Worsening bilateral interstitial and airspace opacities throughout both lungs, compatible with a multifocal pneumonia such as atypical viral pneumonia including COVID-19. Electronically Signed   By: PLovena LeM.D.   On: 01/24/2019 22:17   Dg Chest Port 1v Same Day  Result Date: 01/26/2019 CLINICAL DATA:  Shortness of breath EXAM: PORTABLE CHEST 1 VIEW COMPARISON:  01/24/2019 FINDINGS: Heart is borderline enlarged. There diffuse bilateral airspace opacities noted with some peripheral predominance. This is worsened since prior study. Low lung volumes. No visible effusions. IMPRESSION: Cardiomegaly. Patchy bilateral airspace opacities with peripheral predominance. This is worsened since prior study concerning for multifocal pneumonia. Electronically Signed   By: KRolm BaptiseM.D.   On: 01/26/2019 09:14   Vas UKoreaLower Extremity Venous (dvt)  Result Date: 02/05/2019  Lower Venous  Study Indications: Elevated Ddimer.  Risk Factors: COVID 19 positive. Comparison Study: No prior studies. Performing Technologist: Oliver Hum RVT  Examination Guidelines: A complete evaluation includes B-mode imaging, spectral Doppler, color Doppler, and power Doppler as needed of all accessible portions of each vessel. Bilateral  testing is considered an integral part of a complete examination. Limited examinations for reoccurring indications may be performed as noted.  +---------+---------------+---------+-----------+----------+--------------+  RIGHT     Compressibility Phasicity Spontaneity Properties Thrombus Aging  +---------+---------------+---------+-----------+----------+--------------+  CFV       Full            Yes       Yes                                    +---------+---------------+---------+-----------+----------+--------------+  SFJ       Full                                                             +---------+---------------+---------+-----------+----------+--------------+  FV Prox   Full                                                             +---------+---------------+---------+-----------+----------+--------------+  FV Mid    Full                                                             +---------+---------------+---------+-----------+----------+--------------+  FV Distal Full                                                             +---------+---------------+---------+-----------+----------+--------------+  PFV       Full                                                             +---------+---------------+---------+-----------+----------+--------------+  POP       Full            Yes       Yes                                    +---------+---------------+---------+-----------+----------+--------------+  PTV       Full                                                             +---------+---------------+---------+-----------+----------+--------------+  PERO      Full                                                             +---------+---------------+---------+-----------+----------+--------------+   +---------+---------------+---------+-----------+----------+--------------+  LEFT      Compressibility Phasicity Spontaneity Properties Thrombus Aging   +---------+---------------+---------+-----------+----------+--------------+  CFV       Full            Yes       Yes                                    +---------+---------------+---------+-----------+----------+--------------+  SFJ       Full                                                             +---------+---------------+---------+-----------+----------+--------------+  FV Prox   Full                                                             +---------+---------------+---------+-----------+----------+--------------+  FV Mid    Full                                                             +---------+---------------+---------+-----------+----------+--------------+  FV Distal Full                                                             +---------+---------------+---------+-----------+----------+--------------+  PFV       Full                                                             +---------+---------------+---------+-----------+----------+--------------+  POP       Full            Yes       Yes                                    +---------+---------------+---------+-----------+----------+--------------+  PTV       Full                                                             +---------+---------------+---------+-----------+----------+--------------+  PERO      Full                                                             +---------+---------------+---------+-----------+----------+--------------+     Summary: Right: There is no evidence of deep vein thrombosis in the lower extremity. No cystic structure found in the popliteal fossa. Left: There is no evidence of deep vein thrombosis in the lower extremity. No cystic structure found in the popliteal fossa.  *See table(s) above for measurements and observations. Electronically signed by Deitra Mayo MD on 02/05/2019 at 11:31:13 AM.    Final     Micro Results     No results found for this or any previous visit (from the past 240  hour(s)).  Today   Subjective    Nathan Perkins today has no headache,no chest abdominal pain,no new weakness tingling or numbness, feels much better wants to go home today.     Objective   Blood pressure 139/90, pulse 85, temperature 98 F (36.7 C), temperature source Oral, resp. rate 18, height 6' (1.829 m), weight 90.1 kg, SpO2 90 %.   Intake/Output Summary (Last 24 hours) at 02/07/2019 0931 Last data filed at 02/07/2019 0751 Gross per 24 hour  Intake 1460 ml  Output 2350 ml  Net -890 ml    Exam  Awake Alert, Oriented x 3, No new F.N deficits, anxious affect Comern­o.AT,PERRAL Supple Neck,No JVD, No cervical lymphadenopathy appriciated.  Symmetrical Chest wall movement, Good air movement bilaterally, CTAB RRR,No Gallops,Rubs or new Murmurs, No Parasternal Heave +ve B.Sounds, Abd Soft, Non tender, No organomegaly appriciated, No rebound -guarding or rigidity. No Cyanosis, Clubbing or edema, No new Rash or bruise   Data Review   CBC w Diff:  Lab Results  Component Value Date   WBC 12.5 (H) 02/07/2019   HGB 13.6 02/07/2019   HCT 41.7 02/07/2019   PLT 312 02/07/2019   LYMPHOPCT 6 02/07/2019   MONOPCT 6 02/07/2019   EOSPCT 1 02/07/2019   BASOPCT 0 02/07/2019    CMP:  Lab Results  Component Value Date   NA 133 (L) 02/07/2019   K 4.4 02/07/2019   CL 99 02/07/2019   CO2 25 02/07/2019   BUN 26 (H) 02/07/2019   CREATININE 0.86 02/07/2019   PROT 5.6 (L) 02/07/2019   ALBUMIN 3.0 (L) 02/07/2019   BILITOT 0.8 02/07/2019   ALKPHOS 49 02/07/2019   AST 19 02/07/2019   ALT 36 02/07/2019  .   Total Time in preparing paper work, data evaluation and todays exam - 40 minutes  Lala Lund M.D on 02/07/2019 at 9:31 AM  [facility]   Office  [phone]

## 2019-02-07 NOTE — Progress Notes (Signed)
Physical Therapy Treatment Patient Details Name: Nathan Perkins MRN: [id number] DOB: 23-May-1955 Today's Date: 02/07/2019    History of Present Illness is a 63 y.o. male with PMHx of insulin-dependent DM-2, HTN presented  to ER with 1 week history of cough, fever, myalgias and worsening shortness of breath.  Patient was found to have acute hypoxic respiratory failure in setting of COVID-19 pneumonia .Admitted 01/24/19    PT Comments    The patient is eager to DC to home. See Oxygen saturation walk test note. Patient ambulated x 80' total with SPo2 dropping to 85% on RA. Recommend HHPT.  Follow Up Recommendations  Home health PT     Equipment Recommendations  None recommended by PT    Recommendations for Other Services       Precautions / Restrictions Precautions Precaution Comments: probe on earlobe, SPO2    Mobility  Bed Mobility               General bed mobility comments: pt in recliner at therapist arrival  Transfers   Equipment used: None   Sit to Stand: Supervision         General transfer comment: no assist required  Ambulation/Gait Ambulation/Gait assistance: Supervision Gait Distance (Feet): 80 Feet Assistive device: None Gait Pattern/deviations: Step-through pattern Gait velocity: slow   General Gait Details: amb 40' on RA with SPO2 85%, On 3 L Spo2 95%   Stairs             Wheelchair Mobility    Modified Rankin (Stroke Patients Only)       Balance           Standing balance support: During functional activity Standing balance-Leahy Scale: Fair                              Cognition                                              Exercises      General Comments        Pertinent Vitals/Pain Pain Assessment: No/denies pain    Home Living                      Prior Function            PT Goals (current goals can now be found in the care plan section) Progress towards  PT goals: Progressing toward goals    Frequency    Min 3X/week      PT Plan Current plan remains appropriate    Co-evaluation              AM-PAC PT "6 Clicks" Mobility   Outcome Measure  Help needed turning from your back to your side while in a flat bed without using bedrails?: None Help needed moving from lying on your back to sitting on the side of a flat bed without using bedrails?: None Help needed moving to and from a bed to a chair (including a wheelchair)?: None Help needed standing up from a chair using your arms (e.g., wheelchair or bedside chair)?: None Help needed to walk in hospital room?: A Little Help needed climbing 3-5 steps with a railing? : A Little 6 Click Score: 22    End of Session Equipment Utilized During Treatment:  Oxygen Activity Tolerance: Patient tolerated treatment well Patient left: in chair;with call bell/phone within reach Nurse Communication: Mobility status PT Visit Diagnosis: Other abnormalities of gait and mobility (R26.89);Muscle weakness (generalized) (M62.81)     Time: [id number] PT Time Calculation (min) (ACUTE ONLY): 19 min  Charges:                        Tresa Endo PT Acute Rehabilitation Services Pager [phone] Office [phone]    Claretha Cooper 02/07/2019, 4:42 PM

## 2019-02-07 NOTE — Discharge Instructions (Signed)
Follow with Primary MD Monico Blitz, MD in 7 days   Get CBC, CMP, 2 view Chest X ray -  checked next visit within 1 week by Primary MD    Activity: As tolerated with Full fall precautions use walker/cane & assistance as needed  Disposition Home    Diet: Heart Healthy Low Carb  Accuchecks 4 times/day, Once in AM empty stomach and then before each meal. Log in all results and show them to your Prim.MD in 3 days. If any glucose reading is under 80 or above 300 call your Prim MD immidiately. Follow Low glucose instructions for glucose under 80 as instructed.   Special Instructions: If you have smoked or chewed Tobacco  in the last 2 yrs please stop smoking, stop any regular Alcohol  and or any Recreational drug use.  On your next visit with your primary care physician please Get Medicines reviewed and adjusted.  Please request your Prim.MD to go over all Hospital Tests and Procedure/Radiological results at the follow up, please get all Hospital records sent to your Prim MD by signing hospital release before you go home.  If you experience worsening of your admission symptoms, develop shortness of breath, life threatening emergency, suicidal or homicidal thoughts you must seek medical attention immediately by calling 911 or calling your MD immediately  if symptoms less severe.  You Must read complete instructions/literature along with all the possible adverse reactions/side effects for all the Medicines you take and that have been prescribed to you. Take any new Medicines after you have completely understood and accpet all the possible adverse reactions/side effects.       Person Under Monitoring Name: Nathan Perkins  Location: [place] [address]   Infection Prevention Recommendations for Individuals Confirmed to have, or Being Evaluated for, 2019 Novel Coronavirus (COVID-19) Infection Who Receive Care at Home  Individuals who are confirmed to have, or are being  evaluated for, COVID-19 should follow the prevention steps below until a healthcare provider or local or state health department says they can return to normal activities.  Stay home except to get medical care You should restrict activities outside your home, except for getting medical care. Do not go to work, school, or public areas, and do not use public transportation or taxis.  Call ahead before visiting your doctor Before your medical appointment, call the healthcare provider and tell them that you have, or are being evaluated for, COVID-19 infection. This will help the healthcare providers office take steps to keep other people from getting infected. Ask your healthcare provider to call the local or state health department.  Monitor your symptoms Seek prompt medical attention if your illness is worsening (e.g., difficulty breathing). Before going to your medical appointment, call the healthcare provider and tell them that you have, or are being evaluated for, COVID-19 infection. Ask your healthcare provider to call the local or state health department.  Wear a facemask You should wear a facemask that covers your nose and mouth when you are in the same room with other people and when you visit a healthcare provider. People who live with or visit you should also wear a facemask while they are in the same room with you.  Separate yourself from other people in your home As much as possible, you should stay in a different room from other people in your home. Also, you should use a separate bathroom, if available.  Avoid sharing household items You should not share dishes,  drinking glasses, cups, eating utensils, towels, bedding, or other items with other people in your home. After using these items, you should wash them thoroughly with soap and water.  Cover your coughs and sneezes Cover your mouth and nose with a tissue when you cough or sneeze, or you can cough or sneeze into your  sleeve. Throw used tissues in a lined trash can, and immediately wash your hands with soap and water for at least 20 seconds or use an alcohol-based hand rub.  Wash your [facility] your hands often and thoroughly with soap and water for at least 20 seconds. You can use an alcohol-based hand sanitizer if soap and water are not available and if your hands are not visibly dirty. Avoid touching your eyes, nose, and mouth with unwashed hands.   Prevention Steps for Caregivers and Household Members of Individuals Confirmed to have, or Being Evaluated for, COVID-19 Infection Being Cared for in the Home  If you live with, or provide care at home for, a person confirmed to have, or being evaluated for, COVID-19 infection please follow these guidelines to prevent infection:  Follow healthcare providers instructions Make sure that you understand and can help the patient follow any healthcare provider instructions for all care.  Provide for the patients basic needs You should help the patient with basic needs in the home and provide support for getting groceries, prescriptions, and other personal needs.  Monitor the patients symptoms If they are getting sicker, call his or her medical provider and tell them that the patient has, or is being evaluated for, COVID-19 infection. This will help the healthcare providers office take steps to keep other people from getting infected. Ask the healthcare provider to call the local or state health department.  Limit the number of people who have contact with the patient  If possible, have only one caregiver for the patient.  Other household members should stay in another home or place of residence. If this is not possible, they should stay  in another room, or be separated from the patient as much as possible. Use a separate bathroom, if available.  Restrict visitors who do not have an essential need to be in the home.  Keep older adults, very  young children, and other sick people away from the patient Keep older adults, very young children, and those who have compromised immune systems or chronic health conditions away from the patient. This includes people with chronic heart, lung, or kidney conditions, diabetes, and cancer.  Ensure good ventilation Make sure that shared spaces in the home have good air flow, such as from an air conditioner or an opened window, weather permitting.  Wash your hands often  Wash your hands often and thoroughly with soap and water for at least 20 seconds. You can use an alcohol based hand sanitizer if soap and water are not available and if your hands are not visibly dirty.  Avoid touching your eyes, nose, and mouth with unwashed hands.  Use disposable paper towels to dry your hands. If not available, use dedicated cloth towels and replace them when they become wet.  Wear a facemask and gloves  Wear a disposable facemask at all times in the room and gloves when you touch or have contact with the patients blood, body fluids, and/or secretions or excretions, such as sweat, saliva, sputum, nasal mucus, vomit, urine, or feces.  Ensure the mask fits over your nose and mouth tightly, and do not touch it during use.  Throw out disposable facemasks and gloves after using them. Do not reuse.  Wash your hands immediately after removing your facemask and gloves.  If your personal clothing becomes contaminated, carefully remove clothing and launder. Wash your hands after handling contaminated clothing.  Place all used disposable facemasks, gloves, and other waste in a lined container before disposing them with other household waste.  Remove gloves and wash your hands immediately after handling these items.  Do not share dishes, glasses, or other household items with the patient  Avoid sharing household items. You should not share dishes, drinking glasses, cups, eating utensils, towels, bedding, or other  items with a patient who is confirmed to have, or being evaluated for, COVID-19 infection.  After the person uses these items, you should wash them thoroughly with soap and water.  Wash laundry thoroughly  Immediately remove and wash clothes or bedding that have blood, body fluids, and/or secretions or excretions, such as sweat, saliva, sputum, nasal mucus, vomit, urine, or feces, on them.  Wear gloves when handling laundry from the patient.  Read and follow directions on labels of laundry or clothing items and detergent. In general, wash and dry with the warmest temperatures recommended on the label.  Clean all areas the individual has used often  Clean all touchable surfaces, such as counters, tabletops, doorknobs, bathroom fixtures, toilets, phones, keyboards, tablets, and bedside tables, every day. Also, clean any surfaces that may have blood, body fluids, and/or secretions or excretions on them.  Wear gloves when cleaning surfaces the patient has come in contact with.  Use a diluted bleach solution (e.g., dilute bleach with 1 part bleach and 10 parts water) or a household disinfectant with a label that says EPA-registered for coronaviruses. To make a bleach solution at home, add 1 tablespoon of bleach to 1 quart (4 cups) of water. For a larger supply, add  cup of bleach to 1 gallon (16 cups) of water.  Read labels of cleaning products and follow recommendations provided on product labels. Labels contain instructions for safe and effective use of the cleaning product including precautions you should take when applying the product, such as wearing gloves or eye protection and making sure you have good ventilation during use of the product.  Remove gloves and wash hands immediately after cleaning.  Monitor yourself for signs and symptoms of illness Caregivers and household members are considered close contacts, should monitor their health, and will be asked to limit movement outside of  the home to the extent possible. Follow the monitoring steps for close contacts listed on the symptom monitoring form.   ? If you have additional questions, contact your local health department or call the epidemiologist on call at [phone] (available 24/7). ? This guidance is subject to change. For the most up-to-date guidance from [facility], please refer to their website: [email or web address]

## 2019-02-07 NOTE — Progress Notes (Signed)
SATURATION QUALIFICATIONS: (This note is used to comply with regulatory documentation for home oxygen)  Patient Saturations on Room Air at Rest = 88%  Patient Saturations on Room Air while Ambulating = 85%  Patient Saturations on 3 Liters of oxygen while Ambulating = 88%  Please briefly explain why patient needs home oxygen: Patient Requires supplemental oxygen to maintain O2 saturation > 88%  Nathan Perkins [place]  Office [phone]

## 2019-02-07 NOTE — Progress Notes (Signed)
Pt d/c to home with wife. D/C instructions, oxygen tank and pulse ox given to pt at d/c. IV removed.

## 2019-04-06 ENCOUNTER — Institutional Professional Consult (permissible substitution): Payer: Commercial Managed Care - PPO | Admitting: Internal Medicine

## 2019-04-17 ENCOUNTER — Encounter: Payer: Self-pay | Admitting: Pulmonary Disease

## 2019-04-17 ENCOUNTER — Ambulatory Visit (INDEPENDENT_AMBULATORY_CARE_PROVIDER_SITE_OTHER): Payer: Commercial Managed Care - PPO | Admitting: Pulmonary Disease

## 2019-04-17 ENCOUNTER — Other Ambulatory Visit: Payer: Self-pay

## 2019-04-17 VITALS — BP 142/72 | HR 96 | Temp 97.7°F | Ht 72.0 in | Wt 194.2 lb

## 2019-04-17 DIAGNOSIS — J9611 Chronic respiratory failure with hypoxia: Secondary | ICD-10-CM | POA: Diagnosis not present

## 2019-04-17 DIAGNOSIS — J849 Interstitial pulmonary disease, unspecified: Secondary | ICD-10-CM

## 2019-04-17 DIAGNOSIS — Z8616 Personal history of COVID-19: Secondary | ICD-10-CM

## 2019-04-17 NOTE — Progress Notes (Signed)
[facility]  Chief Complaint  Patient presents with  . Consult    SOB, Post Covid    Constitutional:  BP (!) 142/72 (BP Location: Right Arm, Cuff Size: Normal)   Pulse 96   Temp 97.7 F (36.5 C) (Temporal)   Ht 6' (1.829 m)   Wt 194 lb 3.2 oz (88.1 kg)   SpO2 99%   BMI 26.34 kg/m   Past Medical History:  DM, HTN  Brief Summary:  Nathan Perkins is a 64 y.o. male former smoker diagnosed with COVID 19 pneumonia on 01/24/19 presents for assessment of dyspnea.  He was in [facility] from 01/24/19 to 02/07/19.  He was treated with steroids, remdesivir, convalescent plasma and tocilizumab.  He was discharged home on oxygen, but never needed intubation while at [facility].  He continues to struggle with his breathing.  He can finally get around his house to do simple activities, but not much more than that.  If he is sitting at rest he doesn't need supplemental oxygen.  He needs 2 liters with any activity and sleeping at night.  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  He has not been able to return to work  No hx of CTD.  No having sputum, fever, hemoptysis, skin rash.  Noted to have leg swelling which he thinks was from gout as a reaction to albuterol.  Not using albuterol anymore.  Didn't have any respiratory issues prior to being diagnosed with COVID.    Physical Exam:   Appearance - well kempt   ENMT - clear nasal mucosa, midline nasal  septum, no oral exudates, no LAN, trachea midline  Respiratory - normal chest wall, normal respiratory effort, no accessory muscle use, faint b/l rales more at bases  CV - s1s2 regular rate and rhythm, no murmurs, no peripheral edema, radial pulses symmetric  GI - soft, non tender, no masses  Lymph - no adenopathy noted in neck and axillary areas  MSK - normal gait  Ext - no cyanosis, clubbing, or joint inflammation noted  Skin - no rashes, lesions, or ulcers   Neuro - normal strength, oriented x 3  Psych - normal mood and affect  Discussion:  He had severe COVID 19 pneumonia in October 2020.  While he recovered from the infection he appears to be left with chronic interstitial lung disease as a result.  Assessment/Plan:   Interstitial lung disease from COVID 19 pneumonia with chronic hypoxic respiratory failure. - unstable - continue 2 liters oxygen with activity and sleep - will arrange for pulmonary function testing - will arrange for pulmonary rehab at [facility] - explained that it is uncertain regarding his long term prognosis - in my medical opinion I do not think that he is medically fit to return to work, and it is uncertain whether he would ever be able to work to his previous employment; he will looked into applying for disability    Patient Instructions  Will arrange for pulmonary function test  Will arrange for referral to pulmonary rehab in [facility]  Follow up in 2 months  A total of  48 minutes were spent face to face and non-face to face with the patient and more than half of that time involved counseling or coordination of care.   Chesley Mires, MD [facility] Pager: [phone] 04/17/2019, 1:06 PM  Flow Sheet     Pulmonary tests:  Chest imaging:  CT chest 02/04/19 >> patchy b/l ASD with GGO, small cystic spaces in upper lobes, paraseptal emphysema CT chest 03/22/19 >> peripheral interstitial thickening and scattered GGO  Cardiac tests:  [facility] 01/17/15 >> non obstructive CAD  Medications:   Allergies as of 04/17/2019      Reactions   Invokana [canagliflozin] Other (See Comments)   KETOACIDOSIS    Percocet [oxycodone-acetaminophen] Itching   Shellfish Allergy    Shrimp - possible Iodine allergy UNSPECIFIED REACTION    Sulfa Antibiotics    UNSPECIFIED REACTION       Medication List       Accurate as of April 17, 2019  1:06 PM. If you have any questions,  ask your nurse or doctor.        STOP taking these medications   insulin glargine 100 UNIT/ML injection Commonly known as: Lantus Stopped by: Chesley Mires, MD     TAKE these medications   albuterol 108 (90 Base) MCG/ACT inhaler Commonly known as: VENTOLIN HFA Inhale 2 puffs into the lungs every 6 (six) hours as needed for wheezing or shortness of breath.   amLODipine 5 MG tablet Commonly known as: NORVASC Take 5 mg by mouth daily.   chlorpheniramine-HYDROcodone 10-8 MG/5ML Suer Commonly known as: TUSSIONEX Take 5 mLs by mouth every 12 (twelve) hours.   clonazePAM 0.5 MG disintegrating tablet Commonly known as: KLONOPIN Take 1 tablet (0.5 mg total) by mouth 2 (two) times daily.   Flaxseed Oil 1200 MG Caps Take 1 capsule by mouth daily.   HumaLOG KwikPen 100 UNIT/ML KwikPen Generic drug: insulin lispro Inject 4-10 Units into the muscle 3 (three) times daily as needed (Sliding scale).   Insulin Syringe-Needle U-100 25G X 1" 1 ML Misc For 4 times a day insulin SQ, 1 month supply. Diagnosis E11.65   lisinopril 40 MG tablet Commonly known as: ZESTRIL Take 40 mg by mouth daily.   methylPREDNISolone 4 MG Tbpk tablet Commonly known as: MEDROL DOSEPAK follow package directions   NovoLIN N ReliOn 100 UNIT/ML injection Generic drug: insulin NPH Human Inject 30-40 Units into the skin See admin instructions. 40 units in the morning, and 40 units in the evening (evening dose depends on blood sugar levels)   pantoprazole 40 MG tablet Commonly known as: Protonix Take 1 tablet (40 mg total) by mouth daily.   Vitamin D (Ergocalciferol) 1.25 MG (50000 UNIT) Caps capsule Commonly known as: DRISDOL Take 1 capsule by mouth every Sunday.       Past Surgical History:  He  has a past surgical history that includes Appendectomy; Knee arthroscopy (Right); cyst removal of neck; Cholecystectomy; Colonoscopy (N/A, 03/15/2013); Lumbar disc surgery (06/25/2017); Cervical disc surgery;  Lumbar laminectomy/decompression microdiscectomy (N/A, 07/30/2017); and Cardiac catheterization (N/A, 01/07/2015).  Family History:  His family history is not on file.  Social History:  He  reports that he quit smoking about 19 years ago. His smoking use included cigarettes. He started smoking about 50 years ago. He has a 64.00 pack-year smoking history. He has quit using smokeless tobacco.  His smokeless tobacco use included chew. He reports that he does not drink alcohol or use drugs.

## 2019-04-17 NOTE — Patient Instructions (Signed)
Will arrange for pulmonary function test  Will arrange for referral to pulmonary rehab in [facility]  Follow up in 2 months

## 2019-05-01 ENCOUNTER — Telehealth (HOSPITAL_COMMUNITY): Payer: Self-pay | Admitting: *Deleted

## 2019-05-01 NOTE — Telephone Encounter (Signed)
Called the patient today to discuss the Pulmonary program. Patient does have a smart phone but would still like to wait until we reopen to come into the gym program. We will call when we reopen.

## 2019-05-30 ENCOUNTER — Ambulatory Visit: Payer: Commercial Managed Care - PPO | Admitting: Pulmonary Disease

## 2019-06-02 ENCOUNTER — Other Ambulatory Visit (HOSPITAL_COMMUNITY)
Admission: RE | Admit: 2019-06-02 | Discharge: 2019-06-02 | Disposition: A | Discharge status: Home / Self Care | Payer: Commercial Managed Care - PPO | Source: Ambulatory Visit | Attending: Pulmonary Disease | Admitting: Pulmonary Disease

## 2019-06-02 ENCOUNTER — Other Ambulatory Visit: Payer: Self-pay

## 2019-06-02 DIAGNOSIS — Z20822 Contact with and (suspected) exposure to covid-19: Secondary | ICD-10-CM | POA: Insufficient documentation

## 2019-06-02 DIAGNOSIS — Z01812 Encounter for preprocedural laboratory examination: Secondary | ICD-10-CM | POA: Diagnosis not present

## 2019-06-02 LAB — SARS CORONAVIRUS 2 (TAT 6-24 HRS): SARS Coronavirus 2: NEGATIVE

## 2019-06-06 ENCOUNTER — Other Ambulatory Visit: Payer: Self-pay

## 2019-06-06 ENCOUNTER — Ambulatory Visit: Payer: Commercial Managed Care - PPO

## 2019-06-06 DIAGNOSIS — Z8616 Personal history of COVID-19: Secondary | ICD-10-CM

## 2019-06-06 DIAGNOSIS — J9611 Chronic respiratory failure with hypoxia: Secondary | ICD-10-CM

## 2019-06-06 DIAGNOSIS — J849 Interstitial pulmonary disease, unspecified: Secondary | ICD-10-CM

## 2019-06-06 LAB — PULMONARY FUNCTION TEST
DL/VA % pred: 102 %
DL/VA: 4.26 ml/min/mmHg/L
DLCO cor % pred: 58 %
DLCO cor: 16.97 ml/min/mmHg
DLCO unc % pred: 58 %
DLCO unc: 16.97 ml/min/mmHg
FEF 25-75 Post: 3.15 L/sec
FEF 25-75 Pre: 2.61 L/sec
FEF2575-%Change-Post: 20 %
FEF2575-%Pred-Post: 102 %
FEF2575-%Pred-Pre: 85 %
FEV1-%Change-Post: 4 %
FEV1-%Pred-Post: 59 %
FEV1-%Pred-Pre: 57 %
FEV1-Post: 2.27 L
FEV1-Pre: 2.18 L
FEV1FVC-%Change-Post: 4 %
FEV1FVC-%Pred-Pre: 110 %
FEV6-%Change-Post: 0 %
FEV6-%Pred-Post: 54 %
FEV6-%Pred-Pre: 54 %
FEV6-Post: 2.63 L
FEV6-Pre: 2.61 L
FEV6FVC-%Pred-Post: 104 %
FEV6FVC-%Pred-Pre: 104 %
FVC-%Change-Post: 0 %
FVC-%Pred-Post: 52 %
FVC-%Pred-Pre: 52 %
FVC-Post: 2.63 L
FVC-Pre: 2.63 L
Post FEV1/FVC ratio: 86 %
Post FEV6/FVC ratio: 100 %
Pre FEV1/FVC ratio: 83 %
Pre FEV6/FVC Ratio: 100 %
RV % pred: 79 %
RV: 1.91 L
TLC % pred: 60 %
TLC: 4.48 L

## 2019-06-15 ENCOUNTER — Telehealth: Payer: Self-pay | Admitting: Pulmonary Disease

## 2019-06-15 NOTE — Telephone Encounter (Signed)
Spoke with the pt  He is calling to get PFT results and f/u set up  Nothing open with VS so scheduled him with Wyn Quaker for 06/19/19  Nothing further needed

## 2019-06-16 NOTE — Progress Notes (Signed)
@Patient  ID: Nathan Perkins, male    DOB: December 15, 1955, 64 y.o.   MRN: [id number]  Chief Complaint  Patient presents with  . Follow-up    F/U after PFT. States his breathing has been stable since last visit. Has not been using O2 at home.     Referring provider: Monico Blitz, MD  HPI:  64 year old male former smoker initially consulted with our practice in January/2021 status post COVID-19 infection in October/2020 which required hospitalization (treated with steroids, remdesivir, convalescent plasma and tocilizumab, never needed intubation)  PMH: Abnormal nuclear stress test, atypical chest pain Smoker/ Smoking History: Former smoker.  Quit 2001.  64-pack-year smoking history. Maintenance:  none Pt of: Dr. Halford Chessman  06/19/2019  - Visit   64 y.o. male former smoker diagnosed with COVID 19 pneumonia on 01/24/19 presents for assessment of dyspnea.  He was in [facility] from 01/24/19 to 02/07/19.  He was treated with steroids, remdesivir, convalescent plasma and tocilizumab.  He was discharged home on oxygen, but never needed intubation while at [facility].  Patient presenting to office today after completing pulmonary function test on 06/06/2019.  Pulmonary function test results are listed below:  06/06/2019-pulmonary function test-FVC 2.63 (52% predicted), postbronchodilator ratio 86, postbronchodilator FEV1 2.27 (59% predicted), no bronchodilator response, DLCO 16.97 (58% predicted)  Patient is scheduled to start pulmonary rehab at [facility] on 07/04/2019.  Patient reporting he continues to have ongoing shortness of breath and cough.  He is intermittent lung pain.  Walk today in office patient did not have any oxygen desaturations.  He has never had a formal overnight oximetry performed.  He was discharged from the hospital with oxygen after his hospitalization October/2020.  Patient is not currently taking any sort of medication to help with cough suppression at this time.   Patient did apply last  week for long-term disability based off of his breathing  Questionaires / Pulmonary Flowsheets:   MMRC: mMRC Dyspnea Scale mMRC Score  06/19/2019 2    Tests:   06/06/2019-pulmonary function test-FVC 2.63 (52% predicted), postbronchodilator ratio 86, postbronchodilator FEV1 2.27 (59% predicted), no bronchodilator response, DLCO 16.97 (58% predicted) >>>Spirometry is suggestive of a restrictive defect, moderate restriction confirmed by lung volume testing, moderate diffusion defect, no significant bronchodilator response  Chest imaging:  CT chest 02/04/19 >> patchy b/l ASD with GGO, small cystic spaces in upper lobes, paraseptal emphysema CT chest 03/22/19 >> peripheral interstitial thickening and scattered GGO  Cardiac tests:  [facility] 01/17/15 >> non obstructive CAD  FENO:  No results found for: NITRICOXIDE  PFT: PFT Results Latest Ref Rng & Units 06/06/2019  FVC-Pre L 2.63  FVC-Predicted Pre % 52  FVC-Post L 2.63  FVC-Predicted Post % 52  Pre FEV1/FVC % % 83  Post FEV1/FCV % % 86  FEV1-Pre L 2.18  FEV1-Predicted Pre % 57  FEV1-Post L 2.27  DLCO UNC% % 58  DLCO COR %Predicted % 102  TLC L 4.48  TLC % Predicted % 60  RV % Predicted % 79    WALK:  No flowsheet data found.  Imaging: DG Chest 2 View  Result Date: 06/19/2019 CLINICAL DATA:  Status post COVID 19 in October 2020 EXAM: CHEST - 2 VIEW COMPARISON:  02/14/2019 FINDINGS: The heart size and mediastinal contours are within normal limits. Both lungs are clear. The visualized skeletal structures are unremarkable. IMPRESSION: No active cardiopulmonary disease. Electronically Signed   By: Kathreen Devoid   On: 06/19/2019 15:28    Lab Results:  CBC  Component Value Date/Time   WBC 12.5 (H) 02/07/2019 0134   RBC 4.99 02/07/2019 0134   HGB 13.6 02/07/2019 0134   HCT 41.7 02/07/2019 0134   PLT 312 02/07/2019 0134   MCV 83.6 02/07/2019 0134   MCH 27.3 02/07/2019 0134   MCHC 32.6 02/07/2019 0134   RDW 12.6 02/07/2019  0134   LYMPHSABS 0.7 02/07/2019 0134   MONOABS 0.8 02/07/2019 0134   EOSABS 0.1 02/07/2019 0134   BASOSABS 0.0 02/07/2019 0134    BMET    Component Value Date/Time   NA 133 (L) 02/07/2019 0134   K 4.4 02/07/2019 0134   CL 99 02/07/2019 0134   CO2 25 02/07/2019 0134   GLUCOSE 277 (H) 02/07/2019 0134   BUN 26 (H) 02/07/2019 0134   CREATININE 0.86 02/07/2019 0134   CALCIUM 8.7 (L) 02/07/2019 0134   GFRNONAA >60 02/07/2019 0134   GFRAA >60 02/07/2019 0134    BNP    Component Value Date/Time   BNP 41.0 02/04/2019 0004    ProBNP No results found for: PROBNP  Specialty Problems      Pulmonary Problems   Pneumonia due to COVID-19 virus   ILD (interstitial lung disease) (HCC)      Allergies  Allergen Reactions  . Invokana [Canagliflozin] Other (See Comments)    KETOACIDOSIS   . Percocet [Oxycodone-Acetaminophen] Itching  . Shellfish Allergy     Shrimp - possible Iodine allergy UNSPECIFIED REACTION   . Sulfa Antibiotics     UNSPECIFIED REACTION      There is no immunization history on file for this patient.  Past Medical History:  Diagnosis Date  . Cancer ([place])    skin cancer face- basal  . Complication of anesthesia   . Diabetes ([place])    type 2 x 5 yrs  . Hypertension    since age 16  . PONV (postoperative nausea and vomiting) 06/25/2017    Tobacco History: Social History   Tobacco Use  Smoking Status Former Smoker  . Packs/day: 2.00  . Years: 32.00  . Pack years: 64.00  . Types: Cigarettes  . Start date: 01/02/1969  . Quit date: 07/03/1999  . Years since quitting: 19.9  Smokeless Tobacco Former Systems developer  . Types: Chew   Counseling given: Yes   Continue to not smoke  Outpatient Encounter Medications as of 06/19/2019  Medication Sig  . amLODipine (NORVASC) 5 MG tablet Take 5 mg by mouth daily.  . Flaxseed, Linseed, (FLAXSEED OIL) 1200 MG CAPS Take 1 capsule by mouth daily.   [name] Kitchen HUMALOG KWIKPEN 100 UNIT/ML KiwkPen Inject 4-10 Units into the  muscle 3 (three) times daily as needed (Sliding scale).   . Insulin Syringe-Needle U-100 25G X 1" 1 ML MISC For 4 times a day insulin SQ, 1 month supply. Diagnosis E11.65  . lisinopril (PRINIVIL,ZESTRIL) 40 MG tablet Take 40 mg by mouth daily.   [name] Kitchen NOVOLIN N RELION 100 UNIT/ML injection Inject 30-40 Units into the skin See admin instructions. 40 units in the morning, and 40 units in the evening (evening dose depends on blood sugar levels)  . pantoprazole (PROTONIX) 40 MG tablet Take 40 mg by mouth daily.  . Vitamin D, Ergocalciferol, (DRISDOL) 50000 UNITS CAPS capsule Take 1 capsule by mouth every Sunday.   . benzonatate (TESSALON) 200 MG capsule Take 1 capsule (200 mg total) by mouth 3 (three) times daily as needed for cough.  . [DISCONTINUED] albuterol (VENTOLIN HFA) 108 (90 Base) MCG/ACT inhaler Inhale 2 puffs into the lungs  every 6 (six) hours as needed for wheezing or shortness of breath. (Patient not taking: Reported on 04/17/2019)  . [DISCONTINUED] chlorpheniramine-HYDROcodone (TUSSIONEX) 10-8 MG/5ML SUER Take 5 mLs by mouth every 12 (twelve) hours. (Patient not taking: Reported on 04/17/2019)  . [DISCONTINUED] clonazePAM (KLONOPIN) 0.5 MG disintegrating tablet Take 1 tablet (0.5 mg total) by mouth 2 (two) times daily. (Patient not taking: Reported on 04/17/2019)  . [DISCONTINUED] methylPREDNISolone (MEDROL DOSEPAK) 4 MG TBPK tablet follow package directions (Patient not taking: Reported on 04/17/2019)  . [DISCONTINUED] omeprazole (PRILOSEC) 20 MG capsule   . [DISCONTINUED] pantoprazole (PROTONIX) 40 MG tablet Take 1 tablet (40 mg total) by mouth daily.   No facility-administered encounter medications on file as of 06/19/2019.     Review of Systems  Review of Systems  Constitutional: Positive for activity change, appetite change and fatigue. Negative for chills, fever and unexpected weight change.  HENT: Negative for postnasal drip, rhinorrhea, sinus pressure, sinus pain and sore throat.     Eyes: Negative.   Respiratory: Positive for cough and shortness of breath. Negative for wheezing.   Cardiovascular: Negative for chest pain and palpitations.  Gastrointestinal: Negative for constipation, diarrhea, nausea and vomiting.  Endocrine: Negative.   Genitourinary: Negative.   Musculoskeletal: Negative.   Skin: Negative.   Neurological: Negative for dizziness and headaches.  Psychiatric/Behavioral: Negative.  Negative for dysphoric mood. The patient is not nervous/anxious.   All other systems reviewed and are negative.    Physical Exam  BP 128/80 (BP Location: Left Arm, Patient Position: Sitting, Cuff Size: Normal)   Pulse (!) 102   Temp 98.7 F (37.1 C) (Temporal)   Ht 6' (1.829 m)   Wt 186 lb 6.4 oz (84.6 kg)   SpO2 97% Comment: on RA  BMI 25.28 kg/m   Wt Readings from Last 5 Encounters:  06/19/19 186 lb 6.4 oz (84.6 kg)  04/17/19 194 lb 3.2 oz (88.1 kg)  01/25/19 198 lb 10.2 oz (90.1 kg)  07/30/17 200 lb (90.7 kg)  01/07/15 204 lb (92.5 kg)    BMI Readings from Last 5 Encounters:  06/19/19 25.28 kg/m  04/17/19 26.34 kg/m  01/25/19 26.94 kg/m  07/30/17 27.12 kg/m  01/07/15 27.67 kg/m     Physical Exam Vitals and nursing note reviewed.  Constitutional:      General: He is not in acute distress.    Appearance: Normal appearance. He is normal weight. He is ill-appearing.  HENT:     Head: Normocephalic and atraumatic.     Right Ear: Hearing, tympanic membrane, ear canal and external ear normal.     Left Ear: Hearing, tympanic membrane, ear canal and external ear normal.     Nose: Nose normal. No mucosal edema or rhinorrhea.     Right Turbinates: Not enlarged.     Left Turbinates: Not enlarged.     Mouth/Throat:     Mouth: Mucous membranes are dry.     Pharynx: Oropharynx is clear. No oropharyngeal exudate.  Eyes:     Pupils: Pupils are equal, round, and reactive to light.  Cardiovascular:     Rate and Rhythm: Normal rate and regular rhythm.      Pulses: Normal pulses.     Heart sounds: Normal heart sounds. No murmur.  Pulmonary:     Effort: Pulmonary effort is normal.     Breath sounds: Rhonchi and rales (basilar ) present. No decreased breath sounds or wheezing.     Comments: Difficult lung exam, patient coughing multiple times with  inspiration, very noisy chest Musculoskeletal:     Cervical back: Normal range of motion.     Right lower leg: No edema.     Left lower leg: No edema.  Lymphadenopathy:     Cervical: No cervical adenopathy.  Skin:    General: Skin is warm and dry.     Capillary Refill: Capillary refill takes less than 2 seconds.     Findings: No erythema or rash.  Neurological:     General: No focal deficit present.     Mental Status: He is alert and oriented to person, place, and time.     Motor: No weakness.     Coordination: Coordination normal.     Gait: Gait is intact. Gait (Tolerated walk in office) normal.  Psychiatric:        Mood and Affect: Mood normal.        Behavior: Behavior normal. Behavior is cooperative.        Thought Content: Thought content normal.        Judgment: Judgment normal.       Assessment & Plan:   ILD (interstitial lung disease) ([place]) Post Covid ILD Reviewed pulmonary function testing today with patient  Plan: Repeat high-resolution CT chest in June/2021 May need to consider repeat pulmonary function testing in 1 year We will add pt to post Covid ILD spreadsheet Walk today in office, no oxygen desaturations We will order overnight oximetry Tessalon Perles for cough suppression Can use Delsym during the day for cough suppression Chest x-ray today Lab work today 25-month follow-up with our office  Pneumonia due to COVID-19 virus Post Covid pneumonia ILD  Plan: We will repeat CT imaging in June/2021 Chest x-ray today  Healthcare maintenance Plan: Patient is due for the Pneumovax 23, will discuss at next office visit We would recommend COVID-19 vaccine for  the patient    Return in about 2 months (around 08/19/2019), or if symptoms worsen or fail to improve, for Follow up with Dr. Halford Chessman.   Lauraine Rinne, NP 06/19/2019   This appointment required 50 minutes of patient care (this includes precharting, chart review, review of results, face-to-face care, etc.).

## 2019-06-19 ENCOUNTER — Ambulatory Visit (INDEPENDENT_AMBULATORY_CARE_PROVIDER_SITE_OTHER): Payer: Commercial Managed Care - PPO

## 2019-06-19 ENCOUNTER — Other Ambulatory Visit: Payer: Self-pay

## 2019-06-19 ENCOUNTER — Encounter: Payer: Self-pay | Admitting: Pulmonary Disease

## 2019-06-19 ENCOUNTER — Ambulatory Visit (INDEPENDENT_AMBULATORY_CARE_PROVIDER_SITE_OTHER): Payer: Commercial Managed Care - PPO | Admitting: Pulmonary Disease

## 2019-06-19 VITALS — BP 128/80 | HR 102 | Temp 98.7°F | Ht 72.0 in | Wt 186.4 lb

## 2019-06-19 DIAGNOSIS — J849 Interstitial pulmonary disease, unspecified: Secondary | ICD-10-CM

## 2019-06-19 DIAGNOSIS — Z0000 Encounter for general adult medical examination without abnormal findings: Secondary | ICD-10-CM

## 2019-06-19 DIAGNOSIS — U071 COVID-19: Secondary | ICD-10-CM | POA: Diagnosis not present

## 2019-06-19 DIAGNOSIS — J1282 Pneumonia due to coronavirus disease 2019: Secondary | ICD-10-CM

## 2019-06-19 LAB — COMPREHENSIVE METABOLIC PANEL
ALT: 4 U/L (ref 0–53)
AST: 8 U/L (ref 0–37)
Albumin: 3.4 g/dL — ABNORMAL LOW (ref 3.5–5.2)
Alkaline Phosphatase: 51 U/L (ref 39–117)
BUN: 8 mg/dL (ref 6–23)
CO2: 29 mEq/L (ref 19–32)
Calcium: 9.2 mg/dL (ref 8.4–10.5)
Chloride: 98 mEq/L (ref 96–112)
Creatinine, Ser: 0.84 mg/dL (ref 0.40–1.50)
GFR: 92.29 mL/min (ref >60.00)
Glucose, Bld: 132 mg/dL — ABNORMAL HIGH (ref 70–99)
Potassium: 4 mEq/L (ref 3.5–5.1)
Sodium: 134 mEq/L — ABNORMAL LOW (ref 135–145)
Total Bilirubin: 0.5 mg/dL (ref 0.2–1.2)
Total Protein: 7.7 g/dL (ref 6.0–8.3)

## 2019-06-19 LAB — CBC WITH DIFFERENTIAL/PLATELET
Basophils Absolute: 0 10*3/uL (ref 0.0–0.1)
Basophils Relative: 0.4 % (ref 0.0–3.0)
Eosinophils Absolute: 0 10*3/uL (ref 0.0–0.7)
Eosinophils Relative: 0.2 % (ref 0.0–5.0)
HCT: 33 % — ABNORMAL LOW (ref 39.0–52.0)
Hemoglobin: 10.7 g/dL — ABNORMAL LOW (ref 13.0–17.0)
Lymphocytes Relative: 14.4 % (ref 12.0–46.0)
Lymphs Abs: 1.2 10*3/uL (ref 0.7–4.0)
MCHC: 32.6 g/dL (ref 30.0–36.0)
MCV: 66.4 fl — ABNORMAL LOW (ref 78.0–100.0)
Monocytes Absolute: 0.6 10*3/uL (ref 0.1–1.0)
Monocytes Relative: 7.8 % (ref 3.0–12.0)
Neutro Abs: 6.3 10*3/uL (ref 1.4–7.7)
Neutrophils Relative %: 77.2 % — ABNORMAL HIGH (ref 43.0–77.0)
Platelets: 300 10*3/uL (ref 150.0–400.0)
RBC: 4.96 Mil/uL (ref 4.22–5.81)
RDW: 15.5 % (ref 11.5–15.5)
WBC: 8.1 10*3/uL (ref 4.0–10.5)

## 2019-06-19 MED ORDER — BENZONATATE 200 MG PO CAPS: 200.0000 mg | ORAL_CAPSULE | Freq: Three times a day (TID) | ORAL | 1 refills | Status: DC | PRN

## 2019-06-19 NOTE — Assessment & Plan Note (Addendum)
Post Covid ILD Reviewed pulmonary function testing today with patient  Plan: Repeat high-resolution CT chest in June/2021 May need to consider repeat pulmonary function testing in 1 year We will add pt to post Covid ILD spreadsheet Walk today in office, no oxygen desaturations We will order overnight oximetry Tessalon Perles for cough suppression Can use Delsym during the day for cough suppression Chest x-ray today Lab work today 62-month follow-up with our office

## 2019-06-19 NOTE — Assessment & Plan Note (Signed)
Plan: Patient is due for the Pneumovax 23, will discuss at next office visit We would recommend COVID-19 vaccine for the patient

## 2019-06-19 NOTE — Patient Instructions (Addendum)
You were seen today by Lauraine Rinne, NP  for:   It was nice meeting you today.  We reviewed your breathing test as well as your CT of your chest from December/2020.  We will repeat a CT of your chest in June/2021.  I would like to better manage her cough.  We will also obtain a chest x-ray as well as lab work today.  Your walk today in office he did well without any desaturations.  I am glad that you have applied for disability coverage.  Take care of yourself and stay safe,  Jalien Weakland  1. ILD (interstitial lung disease) (HCC)  - benzonatate (TESSALON) 200 MG capsule; Take 1 capsule (200 mg total) by mouth 3 (three) times daily as needed for cough.  Dispense: 90 capsule; Refill: 1 - DG Chest 2 View; Future - CT Chest High Resolution; Future - Comp Met (CMET); Future - CBC with Differential/Platelet; Future  Walk today in office stable without any oxygen desaturations  Continue forward [facility]  2. Pneumonia due to COVID-19 virus  - benzonatate (TESSALON) 200 MG capsule; Take 1 capsule (200 mg total) by mouth 3 (three) times daily as needed for cough.  Dispense: 90 capsule; Refill: 1 - DG Chest 2 View; Future   We recommend today:  Orders Placed This Encounter  Procedures  . DG Chest 2 View    Standing Status:   Future    Number of Occurrences:   1    Standing Expiration Date:   08/18/2020    Order Specific Question:   Reason for Exam (SYMPTOM  OR DIAGNOSIS REQUIRED)    Answer:   cough/ s/p covid oct/2020    Order Specific Question:   Preferred imaging location?    Answer:   Internal    Order Specific Question:   Radiology Contrast Protocol - do NOT remove file path    Answer:   \\[email or web address]\[email or web address]\DXFluoroContrastProtocols.pdf  . CT Chest High Resolution    -High-res CT with supine and prone positioning -inspiratory and expiratory cuts.  -Only to be read by Dr. Rosario Jacks and Dr. Weber Cooks.    Standing Status:   Future    Standing Expiration  Date:   08/18/2020    Scheduling Instructions:     Schedule between: 09/24/19 - 10/04/19    Order Specific Question:   Preferred imaging location?    Answer:   [place]    Order Specific Question:   Radiology Contrast Protocol - do NOT remove file path    Answer:   \\[email or web address]\Radiant\CTProtocols.pdf  . Comp Met (CMET)    Standing Status:   Future    Standing Expiration Date:   06/18/2020  . CBC with Differential/Platelet    Standing Status:   Future    Standing Expiration Date:   06/18/2020   Orders Placed This Encounter  Procedures  . DG Chest 2 View  . CT Chest High Resolution  . Comp Met (CMET)  . CBC with Differential/Platelet   Meds ordered this encounter  Medications  . benzonatate (TESSALON) 200 MG capsule    Sig: Take 1 capsule (200 mg total) by mouth 3 (three) times daily as needed for cough.    Dispense:  90 capsule    Refill:  1    Follow Up:    Return in about 2 months (around 08/19/2019), or if symptoms worsen or fail to improve, for Follow up with Dr. Halford Chessman.   Please do your part to  reduce the spread of COVID-19:      Reduce your risk of any infection  and COVID19 by using the similar precautions used for avoiding the common cold or flu:  [name] Kitchen Wash your hands often with soap and warm water for at least 20 seconds.  If soap and water are not readily available, use an alcohol-based hand sanitizer with at least 60% alcohol.  . If coughing or sneezing, cover your mouth and nose by coughing or sneezing into the elbow areas of your shirt or coat, into a tissue or into your sleeve (not your hands). Langley Gauss A MASK when in public  . Avoid shaking hands with others and consider head nods or verbal greetings only. . Avoid touching your eyes, nose, or mouth with unwashed hands.  . Avoid close contact with people who are sick. . Avoid places or events with large numbers of people in one location, like concerts or sporting events. . If you have some  symptoms but not all symptoms, continue to monitor at home and seek medical attention if your symptoms worsen. . If you are having a medical emergency, call 911.   [place] / e-Visit: [email or web address]         [facility]: [facility]: [phone]                   [facility]: [phone]     It is flu season:   >>> Best ways to protect herself from the flu: Receive the yearly flu vaccine, practice good hand hygiene washing with soap and also using hand sanitizer when available, eat a nutritious meals, get adequate rest, hydrate appropriately   Please contact the office if your symptoms worsen or you have concerns that you are not improving.   Thank you for choosing [facility] for your healthcare, and for allowing [place] to partner with you on your healthcare journey. I am thankful to be able to provide care to you today.   Wyn Quaker FNP-C

## 2019-06-19 NOTE — Assessment & Plan Note (Signed)
Post Covid pneumonia ILD  Plan: We will repeat CT imaging in June/2021 Chest x-ray today

## 2019-06-20 NOTE — Progress Notes (Signed)
Reviewed and agree with assessment/plan.   Kc Summerson, MD [facility] 04/01/2016, 12:24 PM Pager:  [phone]  

## 2019-06-24 ENCOUNTER — Encounter: Payer: Self-pay | Admitting: Pulmonary Disease

## 2019-06-27 ENCOUNTER — Telehealth: Payer: Self-pay | Admitting: Pulmonary Disease

## 2019-06-27 ENCOUNTER — Encounter: Payer: Self-pay | Admitting: Pulmonary Disease

## 2019-06-27 MED ORDER — HYDROCOD POLST-CPM POLST ER 10-8 MG/5ML PO SUER: 5.0000 mL | Freq: Every evening | ORAL | 0 refills | Status: DC | PRN

## 2019-06-27 NOTE — Telephone Encounter (Signed)
06/27/2019  We received patient's overnight oximetry results that are listed below:  06/24/2019-overnight oximetry on room air-duration of sleep 7 hours and 15 minutes, time spent below 88 percent was 13 minutes. SaO2 low was 83%, average oxygen level is 93.5%.  Based off the information provided patient could qualify for nighttime oxygen if he is interested.  I do not believe that we need to proceed forward with oxygen at this time.  Wyn Quaker, FNP

## 2019-06-27 NOTE — Telephone Encounter (Signed)
Also of note based off of the overnight oximetry patient may have obstructive sleep apnea.  May need to consider testing in the future.  Wyn Quaker FNP

## 2019-06-27 NOTE — Telephone Encounter (Signed)
Spoke with patient. He verbalized understanding about ONO results. He stated that since he really does not need O2 at night, he doesn't want to start it.  He wanted to know if he could call [employer] to come and pick up their equipment. I advised him that I would check with Aaron Edelman first before placing the order. He verbalized understanding.   Aaron Edelman, please advise. Thanks!

## 2019-06-27 NOTE — Telephone Encounter (Signed)
Spoke with the pt and notified his of recommendations per Aaron Edelman and he verbalized understanding and denied any questions.

## 2019-06-27 NOTE — Telephone Encounter (Signed)
06/27/2019  Yes I do not mind refilling this for the patient.  It should be used sparingly and only really in the evenings at night.  Can use Tessalon Perles or over-the-counter cough medicine such as Delsym during the day.  Before prescribing the patient with Tussonex, I have checked [place] PMP aware and the patients overdose risk score is 390. Patient has 5 providers prescribing controlled substances. Patient has used 2 pharmacies.   Triage, please counsel the patient on the sedative effects of Tussionex. Patient to use this medication sparingly and not when driving, drinking alcohol, or using additional sedative medications. Patient has been prescribed 162ml with no refills.   Medication has been sent in.  Wyn Quaker, FNP

## 2019-06-27 NOTE — Telephone Encounter (Signed)
Lauraine Rinne, NP  06/20/2019 9:09 AM EDT    Lab results of come back electrolytes fairly stable. Continue to increase protein in diet. Protein is slowly improving status post hospitalization. This is good news. Hemoglobin has dropped. Please route the patient's primary care. I would recommend the primary care repeat these labs in 2 to 4 weeks to keep an eye on patient's hemoglobin. This can also be a component of patient's shortness of breath.  I would recommend the patient have follow-up with primary care to monitor macrocytic anemia. Patient may be deficient in iron. This would be additional blood work from primary care.  Wyn Quaker, FNP  ------------------------------------------------------- Spoke with pt. He is aware of results. Pt states that Dr. Manuella Ghazi is still his PCP. Results have been routed to his office. Nothing further was needed.

## 2019-06-27 NOTE — Telephone Encounter (Signed)
While speaking to the pt, he asked if Aaron Edelman would be willing to refill Tussionex for him.  Aaron Edelman - please advise. Thanks.

## 2019-06-27 NOTE — Telephone Encounter (Signed)
Yes that is ok.   Nathan Perkins

## 2019-06-29 ENCOUNTER — Telehealth: Payer: Self-pay | Admitting: Pulmonary Disease

## 2019-06-29 NOTE — Telephone Encounter (Signed)
Call received from [facility] requesting labs from 06/19/19 OV. Requested labs faxed to Dr. Manuella Ghazi, [facility]. Nothing further at this time.

## 2019-06-30 ENCOUNTER — Telehealth: Payer: Self-pay | Admitting: Pulmonary Disease

## 2019-06-30 NOTE — Telephone Encounter (Signed)
06/30/2019  See previous triage messages on this.  Patient is already been communicated regarding these results.  Patient did not want to get established on oxygen level.  With it only being 13 minutes below 88% I believe that is reasonable.  Wyn Quaker, FNP

## 2019-06-30 NOTE — Telephone Encounter (Signed)
Forms are in BM's look-at in A pod.  Routing to [place] for results.  Thanks!

## 2019-06-30 NOTE — Telephone Encounter (Signed)
Noted. Nothing further needed. 

## 2019-07-05 ENCOUNTER — Encounter (HOSPITAL_COMMUNITY): Payer: Commercial Managed Care - PPO

## 2019-07-20 ENCOUNTER — Other Ambulatory Visit: Payer: Self-pay

## 2019-07-20 ENCOUNTER — Encounter (HOSPITAL_COMMUNITY): Payer: Self-pay | Admitting: Emergency Medicine

## 2019-07-20 ENCOUNTER — Inpatient Hospital Stay (HOSPITAL_COMMUNITY)
Admission: EM | Admit: 2019-07-20 | Discharge: 2019-07-27 | DRG: 393 | Disposition: A | Discharge status: Hospice | Payer: Commercial Managed Care - PPO | Attending: Internal Medicine | Admitting: Internal Medicine

## 2019-07-20 DIAGNOSIS — D509 Iron deficiency anemia, unspecified: Secondary | ICD-10-CM | POA: Diagnosis present

## 2019-07-20 DIAGNOSIS — I152 Hypertension secondary to endocrine disorders: Secondary | ICD-10-CM | POA: Diagnosis present

## 2019-07-20 DIAGNOSIS — E119 Type 2 diabetes mellitus without complications: Secondary | ICD-10-CM

## 2019-07-20 DIAGNOSIS — K55069 Acute infarction of intestine, part and extent unspecified: Secondary | ICD-10-CM | POA: Diagnosis not present

## 2019-07-20 DIAGNOSIS — D649 Anemia, unspecified: Secondary | ICD-10-CM | POA: Insufficient documentation

## 2019-07-20 DIAGNOSIS — K648 Other hemorrhoids: Secondary | ICD-10-CM | POA: Diagnosis present

## 2019-07-20 DIAGNOSIS — R0989 Other specified symptoms and signs involving the circulatory and respiratory systems: Secondary | ICD-10-CM

## 2019-07-20 DIAGNOSIS — R0600 Dyspnea, unspecified: Secondary | ICD-10-CM

## 2019-07-20 DIAGNOSIS — Z8701 Personal history of pneumonia (recurrent): Secondary | ICD-10-CM

## 2019-07-20 DIAGNOSIS — K317 Polyp of stomach and duodenum: Secondary | ICD-10-CM | POA: Diagnosis present

## 2019-07-20 DIAGNOSIS — J9610 Chronic respiratory failure, unspecified whether with hypoxia or hypercapnia: Secondary | ICD-10-CM | POA: Diagnosis present

## 2019-07-20 DIAGNOSIS — J84178 Other interstitial pulmonary diseases with fibrosis in diseases classified elsewhere: Secondary | ICD-10-CM | POA: Diagnosis present

## 2019-07-20 DIAGNOSIS — Z885 Allergy status to narcotic agent status: Secondary | ICD-10-CM

## 2019-07-20 DIAGNOSIS — J849 Interstitial pulmonary disease, unspecified: Secondary | ICD-10-CM | POA: Diagnosis present

## 2019-07-20 DIAGNOSIS — Z882 Allergy status to sulfonamides status: Secondary | ICD-10-CM

## 2019-07-20 DIAGNOSIS — N280 Ischemia and infarction of kidney: Secondary | ICD-10-CM | POA: Diagnosis not present

## 2019-07-20 DIAGNOSIS — E1159 Type 2 diabetes mellitus with other circulatory complications: Secondary | ICD-10-CM | POA: Diagnosis present

## 2019-07-20 DIAGNOSIS — R1084 Generalized abdominal pain: Secondary | ICD-10-CM

## 2019-07-20 DIAGNOSIS — J9611 Chronic respiratory failure with hypoxia: Secondary | ICD-10-CM | POA: Diagnosis present

## 2019-07-20 DIAGNOSIS — I76 Septic arterial embolism: Secondary | ICD-10-CM | POA: Diagnosis present

## 2019-07-20 DIAGNOSIS — Z515 Encounter for palliative care: Secondary | ICD-10-CM | POA: Diagnosis not present

## 2019-07-20 DIAGNOSIS — I330 Acute and subacute infective endocarditis: Secondary | ICD-10-CM | POA: Diagnosis present

## 2019-07-20 DIAGNOSIS — E1169 Type 2 diabetes mellitus with other specified complication: Secondary | ICD-10-CM | POA: Diagnosis present

## 2019-07-20 DIAGNOSIS — Z66 Do not resuscitate: Secondary | ICD-10-CM | POA: Diagnosis present

## 2019-07-20 DIAGNOSIS — J8410 Pulmonary fibrosis, unspecified: Secondary | ICD-10-CM | POA: Diagnosis present

## 2019-07-20 DIAGNOSIS — Z87891 Personal history of nicotine dependence: Secondary | ICD-10-CM

## 2019-07-20 DIAGNOSIS — Z91013 Allergy to seafood: Secondary | ICD-10-CM

## 2019-07-20 DIAGNOSIS — R195 Other fecal abnormalities: Secondary | ICD-10-CM

## 2019-07-20 DIAGNOSIS — Z85828 Personal history of other malignant neoplasm of skin: Secondary | ICD-10-CM

## 2019-07-20 DIAGNOSIS — B948 Sequelae of other specified infectious and parasitic diseases: Secondary | ICD-10-CM

## 2019-07-20 DIAGNOSIS — K644 Residual hemorrhoidal skin tags: Secondary | ICD-10-CM | POA: Diagnosis present

## 2019-07-20 DIAGNOSIS — Z20822 Contact with and (suspected) exposure to covid-19: Secondary | ICD-10-CM | POA: Diagnosis present

## 2019-07-20 DIAGNOSIS — Z794 Long term (current) use of insulin: Secondary | ICD-10-CM

## 2019-07-20 DIAGNOSIS — Z8673 Personal history of transient ischemic attack (TIA), and cerebral infarction without residual deficits: Secondary | ICD-10-CM

## 2019-07-20 DIAGNOSIS — I358 Other nonrheumatic aortic valve disorders: Secondary | ICD-10-CM | POA: Insufficient documentation

## 2019-07-20 DIAGNOSIS — Z7982 Long term (current) use of aspirin: Secondary | ICD-10-CM

## 2019-07-20 DIAGNOSIS — K55059 Acute (reversible) ischemia of intestine, part and extent unspecified: Secondary | ICD-10-CM | POA: Diagnosis present

## 2019-07-20 DIAGNOSIS — I700 Atherosclerosis of aorta: Secondary | ICD-10-CM | POA: Diagnosis present

## 2019-07-20 DIAGNOSIS — J439 Emphysema, unspecified: Secondary | ICD-10-CM | POA: Diagnosis present

## 2019-07-20 DIAGNOSIS — B952 Enterococcus as the cause of diseases classified elsewhere: Secondary | ICD-10-CM | POA: Diagnosis present

## 2019-07-20 DIAGNOSIS — D735 Infarction of spleen: Secondary | ICD-10-CM | POA: Diagnosis present

## 2019-07-20 DIAGNOSIS — K5730 Diverticulosis of large intestine without perforation or abscess without bleeding: Secondary | ICD-10-CM | POA: Diagnosis present

## 2019-07-20 DIAGNOSIS — R7881 Bacteremia: Secondary | ICD-10-CM | POA: Diagnosis present

## 2019-07-20 HISTORY — DX: Acute embolism and thrombosis of other specified veins: I82.890

## 2019-07-20 HISTORY — DX: Bacteremia: R78.81

## 2019-07-20 HISTORY — DX: Chronic respiratory failure, unspecified whether with hypoxia or hypercapnia: J96.10

## 2019-07-20 HISTORY — DX: Cerebral infarction, unspecified: I63.9

## 2019-07-20 LAB — COMPREHENSIVE METABOLIC PANEL
ALT: 10 U/L (ref 0–44)
AST: 15 U/L (ref 15–41)
Albumin: 2.6 g/dL — ABNORMAL LOW (ref 3.5–5.0)
Alkaline Phosphatase: 47 U/L (ref 38–126)
Anion gap: 7 (ref 5–15)
BUN: 16 mg/dL (ref 8–23)
CO2: 28 mmol/L (ref 22–32)
Calcium: 8.7 mg/dL — ABNORMAL LOW (ref 8.9–10.3)
Chloride: 97 mmol/L — ABNORMAL LOW (ref 98–111)
Creatinine, Ser: 0.97 mg/dL (ref 0.61–1.24)
GFR calc Af Amer: >60 mL/min (ref >60)
GFR calc non Af Amer: >60 mL/min (ref >60)
Glucose, Bld: 157 mg/dL — ABNORMAL HIGH (ref 70–99)
Potassium: 4.4 mmol/L (ref 3.5–5.1)
Sodium: 132 mmol/L — ABNORMAL LOW (ref 135–145)
Total Bilirubin: 0.8 mg/dL (ref 0.3–1.2)
Total Protein: 7 g/dL (ref 6.5–8.1)

## 2019-07-20 LAB — URINALYSIS, ROUTINE W REFLEX MICROSCOPIC
Bacteria, UA: NONE SEEN
Bilirubin Urine: NEGATIVE
Glucose, UA: NEGATIVE mg/dL
Hgb urine dipstick: NEGATIVE
Ketones, ur: NEGATIVE mg/dL
Leukocytes,Ua: NEGATIVE
Nitrite: NEGATIVE
Protein, ur: 30 mg/dL — AB
Specific Gravity, Urine: 1.024 (ref 1.005–1.030)
pH: 5 (ref 5.0–8.0)

## 2019-07-20 LAB — CBC
HCT: 30.8 % — ABNORMAL LOW (ref 39.0–52.0)
Hemoglobin: 9 g/dL — ABNORMAL LOW (ref 13.0–17.0)
MCH: 20.5 pg — ABNORMAL LOW (ref 26.0–34.0)
MCHC: 29.2 g/dL — ABNORMAL LOW (ref 30.0–36.0)
MCV: 70 fL — ABNORMAL LOW (ref 80.0–100.0)
Platelets: 259 10*3/uL (ref 150–400)
RBC: 4.4 MIL/uL (ref 4.22–5.81)
RDW: 17 % — ABNORMAL HIGH (ref 11.5–15.5)
WBC: 8.9 10*3/uL (ref 4.0–10.5)
nRBC: 0 % (ref 0.0–0.2)

## 2019-07-20 LAB — GLUCOSE, CAPILLARY: Glucose-Capillary: 114 mg/dL — ABNORMAL HIGH (ref 70–99)

## 2019-07-20 LAB — SARS CORONAVIRUS 2 (TAT 6-24 HRS): SARS Coronavirus 2: NEGATIVE

## 2019-07-20 MED ORDER — OXYCODONE HCL 5 MG PO TABS
5.0000 mg | ORAL_TABLET | ORAL | Status: DC | PRN
  Administered 2019-07-22 – 2019-07-23 (×2): 5 mg via ORAL
  Filled 2019-07-20 (×3): qty 1

## 2019-07-20 MED ORDER — SENNOSIDES-DOCUSATE SODIUM 8.6-50 MG PO TABS: 1.0000 | ORAL_TABLET | Freq: Every evening | ORAL | Status: DC | PRN

## 2019-07-20 MED ORDER — ONDANSETRON HCL 4 MG/2ML IJ SOLN
4.0000 mg | Freq: Once | INTRAMUSCULAR | Status: AC
  Administered 2019-07-20: 4 mg via INTRAVENOUS
  Filled 2019-07-20: qty 2

## 2019-07-20 MED ORDER — HEPARIN BOLUS VIA INFUSION
3700.0000 [IU] | Freq: Once | INTRAVENOUS | Status: AC
  Administered 2019-07-20: 3700 [IU] via INTRAVENOUS
  Filled 2019-07-20: qty 3700

## 2019-07-20 MED ORDER — INSULIN ASPART 100 UNIT/ML ~~LOC~~ SOLN
0.0000 [IU] | Freq: Three times a day (TID) | SUBCUTANEOUS | Status: DC
  Administered 2019-07-21: 3 [IU] via SUBCUTANEOUS
  Administered 2019-07-21: 9 [IU] via SUBCUTANEOUS
  Administered 2019-07-21: 5 [IU] via SUBCUTANEOUS
  Administered 2019-07-22 (×3): 9 [IU] via SUBCUTANEOUS
  Administered 2019-07-23: 2 [IU] via SUBCUTANEOUS
  Administered 2019-07-23: 3 [IU] via SUBCUTANEOUS
  Administered 2019-07-23: 5 [IU] via SUBCUTANEOUS
  Administered 2019-07-24: 1 [IU] via SUBCUTANEOUS

## 2019-07-20 MED ORDER — ONDANSETRON HCL 4 MG PO TABS: 4.0000 mg | ORAL_TABLET | Freq: Four times a day (QID) | ORAL | Status: DC | PRN

## 2019-07-20 MED ORDER — ACETAMINOPHEN 650 MG RE SUPP: 650.0000 mg | Freq: Four times a day (QID) | RECTAL | Status: DC | PRN

## 2019-07-20 MED ORDER — FENTANYL CITRATE (PF) 100 MCG/2ML IJ SOLN
50.0000 ug | Freq: Once | INTRAMUSCULAR | Status: AC
  Administered 2019-07-20: 50 ug via INTRAVENOUS
  Filled 2019-07-20: qty 2

## 2019-07-20 MED ORDER — MORPHINE SULFATE (PF) 2 MG/ML IV SOLN
1.0000 mg | INTRAVENOUS | Status: DC | PRN
  Administered 2019-07-22 – 2019-07-23 (×2): 1 mg via INTRAVENOUS
  Filled 2019-07-20 (×3): qty 1

## 2019-07-20 MED ORDER — SODIUM CHLORIDE 0.9% FLUSH
3.0000 mL | Freq: Once | INTRAVENOUS | Status: AC
  Administered 2019-07-20: 3 mL via INTRAVENOUS

## 2019-07-20 MED ORDER — HEPARIN (PORCINE) 25000 UT/250ML-% IV SOLN
1900.0000 [IU]/h | INTRAVENOUS | Status: AC
  Administered 2019-07-20: 1200 [IU]/h via INTRAVENOUS
  Administered 2019-07-21 – 2019-07-22 (×3): 1600 [IU]/h via INTRAVENOUS
  Administered 2019-07-23: 1650 [IU]/h via INTRAVENOUS
  Administered 2019-07-24: 1900 [IU]/h via INTRAVENOUS
  Administered 2019-07-24: 1750 [IU]/h via INTRAVENOUS
  Filled 2019-07-20 (×7): qty 250

## 2019-07-20 MED ORDER — ONDANSETRON HCL 4 MG/2ML IJ SOLN: 4.0000 mg | Freq: Four times a day (QID) | INTRAMUSCULAR | Status: DC | PRN

## 2019-07-20 MED ORDER — ACETAMINOPHEN 325 MG PO TABS
650.0000 mg | ORAL_TABLET | Freq: Four times a day (QID) | ORAL | Status: DC | PRN
  Administered 2019-07-21: 650 mg via ORAL
  Filled 2019-07-20 (×2): qty 2

## 2019-07-20 MED ORDER — ALBUTEROL SULFATE (2.5 MG/3ML) 0.083% IN NEBU: 2.5000 mg | INHALATION_SOLUTION | RESPIRATORY_TRACT | Status: DC | PRN

## 2019-07-20 MED ORDER — SODIUM CHLORIDE 0.9% FLUSH
3.0000 mL | Freq: Two times a day (BID) | INTRAVENOUS | Status: DC
  Administered 2019-07-23 – 2019-07-25 (×2): 3 mL via INTRAVENOUS

## 2019-07-20 MED ORDER — PANTOPRAZOLE SODIUM 40 MG PO TBEC
40.0000 mg | DELAYED_RELEASE_TABLET | Freq: Every day | ORAL | Status: DC
  Administered 2019-07-21 – 2019-07-25 (×5): 40 mg via ORAL
  Filled 2019-07-20 (×5): qty 1

## 2019-07-20 NOTE — ED Triage Notes (Signed)
PT reports his MD sent him here to get admitted for spleen problem and intestinal problem- had a CT scan done yesterday and results called to him told him to get to ER and for ED MD to call Dr Brigitte Pulse. Pt has pain to left upper abdomen and mid lower abdomen. Pt endorses nausea.

## 2019-07-20 NOTE — H&P (Signed)
History and Physical    Nathan Perkins [id number] DOB: 07/31/1955 DOA: 07/20/2019  PCP: Monico Blitz, MD  Patient coming from: Home  I have personally briefly reviewed patient's old medical records in [place]  Chief Complaint: Abdominal pain, abnormal CT imaging  HPI: Nathan Perkins is a 64 y.o. 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  Patient states he has had progressive decline since hospitalization for Covid 19 pneumonia in October 2020.  At that time he was treated with IV steroids, remdesivir, Actemra, and convalescent plasma.  He developed post Covid ILD and has been using 2 L supplemental O2 via [place] as an outpatient.  He has had a 40 pound weight loss since that time.  On 05/05/2019 he had an MRI of his head for evaluation of headaches and arm numbness which showed suspected subacute small hemorrhagic infarct in the left occipital lobe.  Over the last several weeks he has been having intermittent severe epigastric to left upper quadrant abdominal pain.  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  He was advised to come to the ED for further evaluation.  Currently patient reports adequate pain control.  He says he has been having drenching night sweats but denies fevers or chills.  He denies any chest pain, dyspnea, cough, or swelling in his legs.  He denies any obvious bleeding.  Outpatient CT imaging reports copied below (viewable in PACS under all patient exams):  CTA chest with contrast 07/14/2019: IMPRESSION: 1. Negative for acute PE or thoracic aortic dissection. 2. New splenic  and left renal infarcts. 3. 2.6 cm poorly marginated masslike area in the central mesentery associated with occlusion of the SMA, incompletely visualized. Recommend CT abdomen/pelvis for more complete evaluation.  Emphysema (ICD10-J43.9).  CTA abdomen/pelvis with contrast 07/19/2019: IMPRESSION: 1. 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 Favor embolic phenomena given the location, and presence of concurrent splenic and left renal infarcts. Possibly septic embolus. Consider echocardiography to rule out cardiac source, as there is minimal atheromatous change in the aorta. Neoplastic process such as carcinoid or lymphoma considered unlikely given the lesion morphology, and lack of involvement of the SMV or regional adenopathy. The findings were reviewed with Dr. Polly Cobia, who concurs. 2. Interval evolution of 2 previously identified splenic infarcts. 3. Stable small wedge-shaped left renal infarct.  Aortic Atherosclerosis (ICD10-I70.0).  ED Course:  Initial vitals showed BP 119/79, pulse 105, RR 18, temp 98.9 Fahrenheit, SPO2 98% on room air.  Labs are notable for WBC 8.9, hemoglobin 9.0, MCV 70, platelets 259,000, sodium 132, potassium 4.4, bicarb 28, BUN 16, creatinine 0.97, LFTs within normal limits.  Blood cultures were obtained and pending.  SARS-CoV-2 PCR is obtained and pending.  Patient was given fentanyl for pain and started on IV heparin.  EDP discussed the case with on-call hematology who recommended continuing heparin and deferring hypercoagulable work-up at this time with inpatient versus outpatient formal hematology consult.  The hospitalist service was consulted to admit for further evaluation and management.  Review of Systems: All systems reviewed and are negative except as documented in history of present illness  above.   Past Medical History:  Diagnosis Date  . Cancer ([place])     skin cancer face- basal  . Complication of anesthesia   . Diabetes ([place])    type 2 x 5 yrs  . Hypertension    since age 26  . PONV (postoperative nausea and vomiting) 06/25/2017    Past Surgical History:  Procedure Laterality Date  . APPENDECTOMY    . CARDIAC CATHETERIZATION N/A 01/07/2015   Procedure: Left Heart Cath and Coronary Angiography;  Surgeon: Burnell Blanks, MD;  Location: [facility];  Service: Cardiovascular;  Laterality: N/A;  . CERVICAL DISC SURGERY     with a plate   . CHOLECYSTECTOMY    . COLONOSCOPY N/A 03/15/2013   Procedure: COLONOSCOPY;  Surgeon: Rogene [place], MD;  Location[facility];  Service: Endoscopy;  Laterality: N/A;  245  . cyst removal of neck    . KNEE ARTHROSCOPY Right   . LUMBAR DISC SURGERY  06/25/2017   lumbar 5  . LUMBAR LAMINECTOMY/DECOMPRESSION MICRODISCECTOMY N/A 07/30/2017   Procedure: MICRODISCECTOMY RIGHT LUMBAR FIVE- SACRAL ONE;  Surgeon: Erline Levine, MD;  Location: [facility];  Service: Neurosurgery;  Laterality: N/A;  MICRODISCECTOMY RIGHT LUMBAR 5- SACRAL 1    Social History:  reports that he quit smoking about 20 years ago. His smoking use included cigarettes. He started smoking about 50 years ago. He has a 64.00 pack-year smoking history. He has quit using smokeless tobacco.  His smokeless tobacco use included chew. He reports that he does not drink alcohol or use drugs.  Allergies  Allergen Reactions  . Invokana [Canagliflozin] Other (See Comments)    KETOACIDOSIS   . Percocet [Oxycodone-Acetaminophen] Itching  . Shellfish Allergy     Shrimp - possible Iodine allergy UNSPECIFIED REACTION   . Sulfa Antibiotics     UNSPECIFIED REACTION     History reviewed. No pertinent family history.   Prior to Admission medications   Medication Sig Start Date End Date Taking? Authorizing Provider  amLODipine (NORVASC) 5 MG tablet Take 5 mg by mouth daily.   Yes [provider]  aspirin EC 81 MG tablet Take 81  mg by mouth daily.   Yes [provider]  benzonatate (TESSALON) 200 MG capsule Take 1 capsule (200 mg total) by mouth 3 (three) times daily as needed for cough. 06/19/19  Yes Lauraine Rinne, NP  ferrous sulfate 325 (65 FE) MG tablet Take 325 mg by mouth daily with breakfast.   Yes [provider]  Flaxseed, Linseed, (FLAXSEED OIL) 1200 MG CAPS Take 1 capsule by mouth daily.    Yes [provider]  HUMALOG KWIKPEN 100 UNIT/ML KiwkPen Inject 4-10 Units into the muscle as needed (Sliding scale).  07/26/17  Yes [provider]  Insulin Syringe-Needle U-100 25G X 1" 1 ML MISC For 4 times a day insulin SQ, 1 month supply. Diagnosis E11.65 Patient taking differently: 1 each by Other route See admin instructions. For 4 times a day insulin SQ, 1 month supply. Diagnosis E11.65 02/07/19  Yes Thurnell Lose, MD  lisinopril (PRINIVIL,ZESTRIL) 40 MG tablet Take 40 mg by mouth daily.  12/13/14  Yes [provider]  methocarbamol (ROBAXIN) 500 MG tablet Take 500 mg by mouth 2 (two) times daily. 07/17/19  Yes [provider]  NOVOLIN N RELION 100 UNIT/ML injection Inject 30-40 Units into the skin See admin instructions. Per sliding scale depends on blood sugar levels 11/06/14  Yes [provider]  ondansetron (ZOFRAN) 4 MG tablet Take 4 mg by mouth every 8 (eight) hours as needed for nausea/vomiting. 06/16/19  Yes [provider]  pantoprazole (PROTONIX) 40 MG tablet Take 40 mg by mouth daily.   Yes [provider]  Vitamin D, Ergocalciferol, (DRISDOL) 50000 UNITS CAPS capsule Take 1 capsule by mouth every Sunday.  12/11/14  Yes [provider]  chlorpheniramine-HYDROcodone (TUSSIONEX PENNKINETIC ER) 10-8 MG/5ML SUER Take 5 mLs by mouth at bedtime as needed for cough. Patient not taking: Reported on 07/20/2019 06/27/19   Lauraine Rinne, NP    Physical Exam: Vitals:   07/20/19 1405 07/20/19 1740  BP: 119/79 121/68  Pulse: (!) 105 99    Resp: 18 16  Temp: 98.9 F (37.2 C)   TempSrc: Oral   SpO2: 98% 99%  Weight: 73.5 kg   Height: 6' (1.829 m)    Constitutional: Resting in bed with head elevated, NAD, calm, comfortable Eyes: PERRL, lids and conjunctivae normal ENMT: Mucous membranes are moist. Posterior pharynx clear of any exudate or lesions. Neck: normal, supple, no masses. Respiratory: clear to auscultation bilaterally, no wheezing, no crackles. Normal respiratory effort. No accessory muscle use.  Cardiovascular: Regular rate and rhythm, no murmurs / rubs / gallops. No extremity edema. 2+ dorsalis pedis pulse on the left, 1+ on the right. Abdomen: no tenderness, no masses palpated. No hepatosplenomegaly.  Musculoskeletal: no clubbing / cyanosis. No joint deformity upper and lower extremities. Good ROM, no contractures. Normal muscle tone.  Skin: no rashes, lesions, ulcers. No induration Neurologic: Speech is slow otherwise CN 2-12 grossly intact. Sensation intact, Strength 5/5 in all 4.  Psychiatric: Normal judgment and insight. Alert and oriented x 3.    Labs on Admission: I have personally reviewed following labs and imaging studies  CBC: Recent Labs  Lab 07/20/19 1448  WBC 8.9  HGB 9.0*  HCT 30.8*  MCV 70.0*  PLT [id number]   Basic Metabolic Panel: Recent Labs  Lab 07/20/19 1448  NA 132*  K 4.4  CL 97*  CO2 28  GLUCOSE 157*  BUN 16  CREATININE 0.97  CALCIUM 8.7*   GFR: Estimated Creatinine Clearance: 81 mL/min (by C-G formula based on SCr of 0.97 mg/dL). Liver Function Tests: Recent Labs  Lab 07/20/19 1448  AST 15  ALT 10  ALKPHOS 47  BILITOT 0.8  PROT 7.0  ALBUMIN 2.6*   No results for input(s): LIPASE, AMYLASE in the last 168 hours. No results for input(s): AMMONIA in the last 168 hours. Coagulation Profile: No results for input(s): INR, PROTIME in the last 168 hours. Cardiac Enzymes: No results for input(s): CKTOTAL, CKMB, CKMBINDEX, TROPONINI in the last 168 hours. BNP (last 3  results) No results for input(s): PROBNP in the last 8760 hours. HbA1C: No results for input(s): HGBA1C in the last 72 hours. CBG: No results for input(s): GLUCAP in the last 168 hours. Lipid Profile: No results for input(s): CHOL, HDL, LDLCALC, TRIG, CHOLHDL, LDLDIRECT in the last 72 hours. Thyroid Function Tests: No results for input(s): TSH, T4TOTAL, FREET4, T3FREE, THYROIDAB in the last 72 hours. Anemia Panel: No results for input(s): VITAMINB12, FOLATE, FERRITIN, TIBC, IRON, RETICCTPCT in the last 72 hours. Urine analysis: No results found for: COLORURINE, APPEARANCEUR, LABSPEC, PHURINE, GLUCOSEU, HGBUR, BILIRUBINUR, KETONESUR, PROTEINUR, UROBILINOGEN, NITRITE, LEUKOCYTESUR  Radiological Exams on Admission: No results found.  EKG: Independently reviewed. Sinus rhythm, early R wave transition.  Early R wave transition is new when compared to prior otherwise no significant change.  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  Patient has been started on IV heparin.  EDP discussed with hematology who recommended against hypercoagulable work-up at this time as labs will be inaccurate in setting of acute process.  Will continue IV heparin anticoagulation.  Monitor hemoglobin and platelets closely. -Continue IV heparin -Monitor for signs/symptoms of bleeding -Obtain echocardiogram -Continue pain control as needed with hold parameters  Normocytic anemia: Without obvious  bleeding.  Continue to monitor while on IV heparin.  Post Covid ILD: Appears to be currently stable on home 2 L of O2 via [place].  Continue albuterol as needed.  Insulin-dependent type 2 diabetes: Placed on sensitive SSI while in hospital.  Hypertension: Holding home amlodipine and lisinopril due to low blood pressures at time of admission.   DVT prophylaxis: Heparin drip Code Status: DNR, confirmed with patient Family Communication: Discussed with wife at bedside Disposition Plan: From home, likely discharge to home Consults called: EDP discussed with on-call hematology Admission status: Observation  Status is: Observation  The patient remains OBS appropriate and will d/c before 2 midnights.  Dispo: The patient is from: Home              Anticipated d/c is to: Home              Anticipated d/c date is: 2 days              Patient currently is not medically stable to d/c.   Zada Finders MD [facility]  If 7PM-7AM, please contact night-coverage [email or web address]  07/20/2019, 7:33 PM

## 2019-07-20 NOTE — ED Provider Notes (Signed)
[facility] EMERGENCY DEPARTMENT Provider Note   CSN: [id number] Arrival date & time: 07/20/19  1402     History Chief Complaint  Patient presents with  . Abdominal Pain    Nathan Perkins is a 64 y.o. male with a history of hypertension, diabetes mellitus, and interstitial lung disease who presents to the ED with concern for abdominal pain intermittently x 6 weeks & abnormal outpatient CT imaging.  Patient states that he is having pain to the left upper quadrant of the abdomen, initial pain started 1.5 months ago, lasted 2 weeks, resolved for 2 weeks, then returned 2 weeks ago. He initially saw his PCP and subsequently his pulmonologist. Ultimately had outpatient imaging notable for splenic/renal infarcts & SMA occlusion which prompted patient being instructed to come to the ED. Patient states pain is to the LUQ, constant, worse with deep breaths & coughing, no alleviating factors. No intervention PTA. Reports he has had issues with dyspnea, poor appetite & weight loss s/p admission for COVID 19 in October 2020. He denies fever, chills, emesis, diarrhea, constipation, melena, hematochezia, or hemoptysis. He denies history of prior clotting issues that he is aware of. Denies hx of IVDU or rheumatic fever. Had recent dental extraction x2, within past couple of months- he is unsure if this was prior to or following onset of pain, but thinks it was after.   HPI     Past Medical History:  Diagnosis Date  . Cancer ([place])    skin cancer face- basal  . Complication of anesthesia   . Diabetes ([place])    type 2 x 5 yrs  . Hypertension    since age 70  . PONV (postoperative nausea and vomiting) 06/25/2017    Patient Active Problem List   Diagnosis Date Noted  . ILD (interstitial lung disease) ([place]) 06/19/2019  . Healthcare maintenance 06/19/2019  . Pneumonia due to COVID-19 virus 01/25/2019  . 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 Abnormal nuclear cardiac imaging test     . Pain in the chest   . Abnormal nuclear stress test 01/03/2015  . Heme positive stool 02/16/2013    Past Surgical History:  Procedure Laterality Date  . APPENDECTOMY    . CARDIAC CATHETERIZATION N/A 01/07/2015   Procedure: Left Heart Cath and Coronary Angiography;  Surgeon: Burnell Blanks, MD;  Location: [facility];  Service: Cardiovascular;  Laterality: N/A;  . CERVICAL DISC SURGERY     with a plate   . CHOLECYSTECTOMY    . COLONOSCOPY N/A 03/15/2013   Procedure: COLONOSCOPY;  Surgeon: Rogene Houston, MD;  [facility];  Service: Endoscopy;  Laterality: N/A;  245  . cyst removal of neck    . KNEE ARTHROSCOPY Right   . LUMBAR DISC SURGERY  06/25/2017   lumbar 5  . LUMBAR LAMINECTOMY/DECOMPRESSION MICRODISCECTOMY N/A 07/30/2017   Procedure: MICRODISCECTOMY RIGHT LUMBAR FIVE- SACRAL ONE;  Surgeon: Erline Levine, MD;  Location: [facility];  Service: Neurosurgery;  Laterality: N/A;  MICRODISCECTOMY RIGHT LUMBAR 5- SACRAL 1       No family history on file.  Social History   Tobacco Use  . Smoking status: Former Smoker    Packs/day: 2.00    Years: 32.00    Pack years: 64.00    Types: Cigarettes    Start date: 01/02/1969    Quit date: 07/03/1999    Years since quitting: 20.0  . Smokeless tobacco: Former Systems developer    Types: Adult nurse  Topics  . Alcohol use: No    Alcohol/week: 0.0 standard drinks  . Drug use: No    Home Medications Prior to Admission medications   Medication Sig Start Date End Date Taking? Authorizing Provider  amLODipine (NORVASC) 5 MG tablet Take 5 mg by mouth daily.    [provider]  benzonatate (TESSALON) 200 MG capsule Take 1 capsule (200 mg total) by mouth 3 (three) times daily as needed for cough. 06/19/19   Lauraine Rinne, NP  chlorpheniramine-HYDROcodone (TUSSIONEX PENNKINETIC ER) 10-8 MG/5ML SUER Take 5 mLs by mouth at bedtime as needed for cough. 06/27/19   Lauraine Rinne, NP  Flaxseed, Linseed, (FLAXSEED OIL)  1200 MG CAPS Take 1 capsule by mouth daily.     [provider]  HUMALOG KWIKPEN 100 UNIT/ML KiwkPen Inject 4-10 Units into the muscle 3 (three) times daily as needed (Sliding scale).  07/26/17   [provider]  Insulin Syringe-Needle U-100 25G X 1" 1 ML MISC For 4 times a day insulin SQ, 1 month supply. Diagnosis E11.65 02/07/19   Thurnell Lose, MD  lisinopril (PRINIVIL,ZESTRIL) 40 MG tablet Take 40 mg by mouth daily.  12/13/14   [provider]  NOVOLIN N RELION 100 UNIT/ML injection Inject 30-40 Units into the skin See admin instructions. 40 units in the morning, and 40 units in the evening (evening dose depends on blood sugar levels) 11/06/14   [provider]  pantoprazole (PROTONIX) 40 MG tablet Take 40 mg by mouth daily.    [provider]  Vitamin D, Ergocalciferol, (DRISDOL) 50000 UNITS CAPS capsule Take 1 capsule by mouth every Sunday.  12/11/14   [provider]    Allergies    Invokana [canagliflozin], Percocet [oxycodone-acetaminophen], Shellfish allergy, and Sulfa antibiotics  Review of Systems   Review of Systems  Constitutional: Positive for appetite change (chronic) and unexpected weight change (chronic). Negative for chills and fever.  Respiratory: Positive for shortness of breath (chronic).   Cardiovascular: Negative for leg swelling.  Gastrointestinal: Positive for abdominal pain and nausea. Negative for anal bleeding, blood in stool, constipation, diarrhea and vomiting.  Genitourinary: Negative for dysuria.  Neurological: Negative for syncope.  All other systems reviewed and are negative.   Physical Exam Updated Vital Signs BP 119/79   Pulse (!) 105   Temp 98.9 F (37.2 C) (Oral)   Resp 18   Ht 6' (1.829 m)   Wt 73.5 kg   SpO2 98%   BMI 21.97 kg/m   Physical Exam Vitals and nursing note reviewed.  Constitutional:      General: He is not in acute distress.    Appearance: He is well-developed. He is  ill-appearing (somewhat chronically ill appearing). He is not toxic-appearing.  HENT:     Head: Normocephalic and atraumatic.  Eyes:     General:        Right eye: No discharge.        Left eye: No discharge.     Conjunctiva/sclera: Conjunctivae normal.  Cardiovascular:     Rate and Rhythm: Normal rate and regular rhythm.  Pulmonary:     Effort: Pulmonary effort is normal. No respiratory distress.     Breath sounds: Normal breath sounds. No wheezing, rhonchi or rales.  Abdominal:     General: There is no distension.     Palpations: Abdomen is soft.     Tenderness: There is no abdominal tenderness. There is no right CVA tenderness, left CVA tenderness, guarding or rebound.  Musculoskeletal:     Cervical back: Neck supple.     Comments: No significant lower extremity edema.  Skin:    General: Skin is warm and dry.     Findings: No rash.  Neurological:     Mental Status: He is alert.     Comments: Clear speech.   Psychiatric:        Behavior: Behavior normal.     ED Results / Procedures / Treatments   Labs (all labs ordered are listed, but only abnormal results are displayed) Labs Reviewed  COMPREHENSIVE METABOLIC PANEL - Abnormal; Notable for the following components:      Result Value   Sodium 132 (*)    Chloride 97 (*)    Glucose, Bld 157 (*)    Calcium 8.7 (*)    Albumin 2.6 (*)    All other components within normal limits  CBC - Abnormal; Notable for the following components:   Hemoglobin 9.0 (*)    HCT 30.8 (*)    MCV 70.0 (*)    MCH 20.5 (*)    MCHC 29.2 (*)    RDW 17.0 (*)    All other components within normal limits  SARS CORONAVIRUS 2 (TAT 6-24 HRS)  CULTURE, BLOOD (ROUTINE X 2)  CULTURE, BLOOD (ROUTINE X 2)  URINALYSIS, ROUTINE W REFLEX MICROSCOPIC  HEPARIN LEVEL (UNFRACTIONATED)  CBC  VITAMIN B12  FOLATE  IRON AND TIBC  FERRITIN  RETICULOCYTES    EKG EKG Interpretation  Date/Time:  Thursday July 20 2019 17:54:55 EDT Ventricular Rate:   98 PR Interval:    QRS Duration: 90 QT Interval:  350 QTC Calculation: 447 R Axis:   14 Text Interpretation: Sinus rhythm Probable left atrial enlargement Abnormal R-wave progression, early transition No significant change since last tracing Confirmed by Wandra Arthurs [phone]) on 07/20/2019 6:52:56 PM   Radiology No results found.  Procedures .Critical Care Performed by: Amaryllis Dyke, PA-C Authorized by: Amaryllis Dyke, PA-C    CRITICAL CARE Performed by: Kennith Maes   Total critical care time: 35 minutes  Critical care time was exclusive of separately billable procedures and treating other patients.  Critical care was necessary to treat or prevent imminent or life-threatening deterioration.  Critical care was time spent personally by me on the following activities: development of treatment plan with patient and/or surrogate as well as nursing, discussions with consultants, evaluation of patient's response to treatment, examination of patient, obtaining history from patient or surrogate, ordering and performing treatments and interventions, ordering and review of laboratory studies, ordering and review of radiographic studies, pulse oximetry and re-evaluation of patient's condition.   (including critical care time)  Medications Ordered in ED Medications  ondansetron (ZOFRAN) injection 4 mg (has no administration in time range)  fentaNYL (SUBLIMAZE) injection 50 mcg (has no administration in time range)  heparin bolus via infusion 3,700 Units (has no administration in time range)  heparin ADULT infusion 100 units/mL (25000 units/28mL sodium chloride 0.45%) (has no administration in time range)  sodium chloride flush (NS) 0.9 % injection 3 mL (3 mLs Intravenous Given 07/20/19 1747)    ED Course  I have reviewed the triage vital signs and the nursing notes.  Pertinent labs & imaging results that were available during my care of the patient were  reviewed by me and considered in my medical decision making (see chart for details).    TRAMAINE PILARSKI was evaluated in Emergency Department on 07/20/2019 for the symptoms described in the  history of present illness. He/she was evaluated in the context of the global COVID-19 pandemic, which necessitated consideration that the patient might be at risk for infection with the SARS-CoV-2 virus that causes COVID-19. Institutional protocols and algorithms that pertain to the evaluation of patients at risk for COVID-19 are in a state of rapid change based on information released by regulatory bodies including the CDC and federal and state organizations. These policies and algorithms were followed during the patient's care in the ED.  MDM Rules/Calculators/A&P                      This patient presents to the ED for concern of abdominal pain with abnormal outpatient imaging.    Additional history obtained:  Additional history obtained from prior records & nursing notes.   CTA Chest 07/14/19:   IMPRESSION: 1. Negative for acute PE or thoracic aortic dissection. 2. New splenic and left renal infarcts. 3. 2.6 cm poorly marginated masslike area in the central mesentery associated with occlusion of the SMA, incompletely visualized. Recommend CT abdomen/pelvis for more complete evaluation. Emphysema (ICD10-J43.9).  CTA abdomen/pelvis 07/19/19:  IMPRESSION: 1. 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 Favor embolic phenomena given the location, and presence of concurrent splenic and left renal infarcts. Possibly septic embolus. Consider echocardiography to rule out cardiac source, as there is minimal atheromatous change in the aorta. Neoplastic process such as carcinoid or lymphoma considered unlikely given the lesion morphology, and lack of involvement of the SMV or regional adenopathy. The findings were  reviewed with Dr. Polly Cobia, who concurs. 2. Interval evolution of 2 previously identified splenic infarcts. 3. Stable small wedge-shaped left renal infarct.  Aortic Atherosclerosis (ICD10-I70.0).  Also reviewed MRI of the brain performed outpatient 05/05/19 which showed findings of a small hemorrhagic infarct- favored to be late subacute.   Lab Tests:  Reviewed, and interpreted labs, which included:  CBC: Mild anemia.  No leukocytosis. CMP: Mild electrolyte abnormalities without significant derangement.  Renal function and LFTs WNL.  EKG: NSR, not in afib, no significant change from prior.   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 In regards to possible septic emobli on CT read- other than recent dental procedure which may have occurred after development of pain, no specific endocarditis risk factors, blood cultures ordered, will defer abx to admitting service. Will start heparin and discuss with hematology.   Medicines ordered:  I ordered medication heparin for SMA occlusion & renal/splenic infarcts as well as fentanyl & zofran for symptomatic control.    Consultations Obtained: 18:53: CONSULT: Discussed with hematology service regarding hypercoagulable work-up- recommended holding off initially as would likely be abnormal with acute clots, recommended inpatient consult during hospital stay vs. Outpatient follow up with hematology, in agreement with heparin. Appreciate consultation.   Dr. Darl Householder has discussed with hospitalist Dr. Posey Pronto- accepts admission.   Critical Interventions: . Heparin  Portions of this note were generated with Lobbyist. Dictation errors may occur despite best attempts at proofreading.  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

## 2019-07-20 NOTE — Progress Notes (Signed)
ANTICOAGULATION CONSULT NOTE - Initial Consult  Pharmacy Consult for Heparin Indication: renal/splenic infarct  Allergies  Allergen Reactions  . Invokana [Canagliflozin] Other (See Comments)    KETOACIDOSIS   . Percocet [Oxycodone-Acetaminophen] Itching  . Shellfish Allergy     Shrimp - possible Iodine allergy UNSPECIFIED REACTION   . Sulfa Antibiotics     UNSPECIFIED REACTION     Patient Measurements: Height: 6' (182.9 cm) Weight: 73.5 kg (162 lb) IBW/kg (Calculated) : 77.6 Heparin Dosing Weight: 73.5 kg  Vital Signs: Temp: 98.9 F (37.2 C) (04/15 1405) Temp Source: Oral (04/15 1405) BP: 119/79 (04/15 1405) Pulse Rate: 105 (04/15 1405)  Labs: Recent Labs    07/20/19 1448  HGB 9.0*  HCT 30.8*  PLT 259  CREATININE 0.97    Estimated Creatinine Clearance: 81 mL/min (by C-G formula based on SCr of 0.97 mg/dL).   Medical History: Past Medical History:  Diagnosis Date  . Cancer ([place])    skin cancer face- basal  . Complication of anesthesia   . Diabetes ([place])    type 2 x 5 yrs  . Hypertension    since age 79  . PONV (postoperative nausea and vomiting) 06/25/2017    Assessment: 64 yo M presents from PCP with renal/splenic infarct. Pharmacy asked to dose heparin. No AC noted PTA. Hgb low at 9, plts wnl. No overt bleeding noted. Scr 0.97.  Goal of Therapy:  Heparin level 0.3-0.7 units/ml Monitor platelets by anticoagulation protocol: Yes   Plan:  Give heparin bolus 3700 units IV x1 Start heparin infusion at 1200 units/hr  Check 6-hr HL Monitor daily HL, CBC, s/sx bleeding  Richardine Service, PharmD PGY1 Pharmacy Resident Phone: [phone] 07/20/2019  5:46 PM  Please check [email or web address] for unit-specific pharmacy phone numbers.

## 2019-07-20 NOTE — ED Notes (Signed)
Pt said he felt very hot and anxious. Pt given cold towel to place on head and ice water. Pt resting.

## 2019-07-21 ENCOUNTER — Encounter (HOSPITAL_COMMUNITY): Payer: Self-pay | Admitting: Internal Medicine

## 2019-07-21 ENCOUNTER — Other Ambulatory Visit: Payer: Self-pay

## 2019-07-21 ENCOUNTER — Observation Stay (HOSPITAL_COMMUNITY): Payer: Commercial Managed Care - PPO

## 2019-07-21 DIAGNOSIS — J9610 Chronic respiratory failure, unspecified whether with hypoxia or hypercapnia: Secondary | ICD-10-CM

## 2019-07-21 DIAGNOSIS — E1169 Type 2 diabetes mellitus with other specified complication: Secondary | ICD-10-CM | POA: Diagnosis present

## 2019-07-21 DIAGNOSIS — J84178 Other interstitial pulmonary diseases with fibrosis in diseases classified elsewhere: Secondary | ICD-10-CM | POA: Diagnosis present

## 2019-07-21 DIAGNOSIS — Z91013 Allergy to seafood: Secondary | ICD-10-CM

## 2019-07-21 DIAGNOSIS — I330 Acute and subacute infective endocarditis: Secondary | ICD-10-CM | POA: Diagnosis present

## 2019-07-21 DIAGNOSIS — Z515 Encounter for palliative care: Secondary | ICD-10-CM | POA: Diagnosis not present

## 2019-07-21 DIAGNOSIS — E1159 Type 2 diabetes mellitus with other circulatory complications: Secondary | ICD-10-CM

## 2019-07-21 DIAGNOSIS — D649 Anemia, unspecified: Secondary | ICD-10-CM

## 2019-07-21 DIAGNOSIS — Z881 Allergy status to other antibiotic agents status: Secondary | ICD-10-CM

## 2019-07-21 DIAGNOSIS — I358 Other nonrheumatic aortic valve disorders: Secondary | ICD-10-CM | POA: Diagnosis not present

## 2019-07-21 DIAGNOSIS — J8410 Pulmonary fibrosis, unspecified: Secondary | ICD-10-CM | POA: Diagnosis not present

## 2019-07-21 DIAGNOSIS — Z794 Long term (current) use of insulin: Secondary | ICD-10-CM | POA: Diagnosis not present

## 2019-07-21 DIAGNOSIS — N280 Ischemia and infarction of kidney: Secondary | ICD-10-CM | POA: Diagnosis present

## 2019-07-21 DIAGNOSIS — K55059 Acute (reversible) ischemia of intestine, part and extent unspecified: Secondary | ICD-10-CM | POA: Diagnosis present

## 2019-07-21 DIAGNOSIS — R7881 Bacteremia: Secondary | ICD-10-CM

## 2019-07-21 DIAGNOSIS — E119 Type 2 diabetes mellitus without complications: Secondary | ICD-10-CM | POA: Diagnosis not present

## 2019-07-21 DIAGNOSIS — K317 Polyp of stomach and duodenum: Secondary | ICD-10-CM | POA: Diagnosis not present

## 2019-07-21 DIAGNOSIS — Z8616 Personal history of COVID-19: Secondary | ICD-10-CM

## 2019-07-21 DIAGNOSIS — B952 Enterococcus as the cause of diseases classified elsewhere: Secondary | ICD-10-CM

## 2019-07-21 DIAGNOSIS — I361 Nonrheumatic tricuspid (valve) insufficiency: Secondary | ICD-10-CM

## 2019-07-21 DIAGNOSIS — D509 Iron deficiency anemia, unspecified: Secondary | ICD-10-CM | POA: Diagnosis present

## 2019-07-21 DIAGNOSIS — R1084 Generalized abdominal pain: Secondary | ICD-10-CM | POA: Diagnosis not present

## 2019-07-21 DIAGNOSIS — I339 Acute and subacute endocarditis, unspecified: Secondary | ICD-10-CM | POA: Diagnosis not present

## 2019-07-21 DIAGNOSIS — J849 Interstitial pulmonary disease, unspecified: Secondary | ICD-10-CM | POA: Diagnosis not present

## 2019-07-21 DIAGNOSIS — Z87891 Personal history of nicotine dependence: Secondary | ICD-10-CM

## 2019-07-21 DIAGNOSIS — I10 Essential (primary) hypertension: Secondary | ICD-10-CM

## 2019-07-21 DIAGNOSIS — K55069 Acute infarction of intestine, part and extent unspecified: Principal | ICD-10-CM

## 2019-07-21 DIAGNOSIS — Z20822 Contact with and (suspected) exposure to covid-19: Secondary | ICD-10-CM | POA: Diagnosis present

## 2019-07-21 DIAGNOSIS — Z8701 Personal history of pneumonia (recurrent): Secondary | ICD-10-CM | POA: Diagnosis not present

## 2019-07-21 DIAGNOSIS — Z885 Allergy status to narcotic agent status: Secondary | ICD-10-CM | POA: Diagnosis not present

## 2019-07-21 DIAGNOSIS — B948 Sequelae of other specified infectious and parasitic diseases: Secondary | ICD-10-CM | POA: Diagnosis not present

## 2019-07-21 DIAGNOSIS — I76 Septic arterial embolism: Secondary | ICD-10-CM | POA: Diagnosis present

## 2019-07-21 DIAGNOSIS — I152 Hypertension secondary to endocrine disorders: Secondary | ICD-10-CM | POA: Diagnosis present

## 2019-07-21 DIAGNOSIS — I351 Nonrheumatic aortic (valve) insufficiency: Secondary | ICD-10-CM | POA: Diagnosis not present

## 2019-07-21 DIAGNOSIS — Z66 Do not resuscitate: Secondary | ICD-10-CM | POA: Diagnosis present

## 2019-07-21 DIAGNOSIS — R0609 Other forms of dyspnea: Secondary | ICD-10-CM | POA: Diagnosis not present

## 2019-07-21 DIAGNOSIS — Z85828 Personal history of other malignant neoplasm of skin: Secondary | ICD-10-CM | POA: Diagnosis not present

## 2019-07-21 DIAGNOSIS — J9611 Chronic respiratory failure with hypoxia: Secondary | ICD-10-CM | POA: Diagnosis present

## 2019-07-21 DIAGNOSIS — B9689 Other specified bacterial agents as the cause of diseases classified elsewhere: Secondary | ICD-10-CM

## 2019-07-21 DIAGNOSIS — I700 Atherosclerosis of aorta: Secondary | ICD-10-CM | POA: Diagnosis present

## 2019-07-21 DIAGNOSIS — Z888 Allergy status to other drugs, medicaments and biological substances status: Secondary | ICD-10-CM

## 2019-07-21 DIAGNOSIS — D735 Infarction of spleen: Secondary | ICD-10-CM | POA: Diagnosis present

## 2019-07-21 DIAGNOSIS — Z882 Allergy status to sulfonamides status: Secondary | ICD-10-CM | POA: Diagnosis not present

## 2019-07-21 LAB — BLOOD CULTURE ID PANEL (REFLEXED)

## 2019-07-21 LAB — BASIC METABOLIC PANEL
Anion gap: 7 (ref 5–15)
BUN: 16 mg/dL (ref 8–23)
CO2: 27 mmol/L (ref 22–32)
Calcium: 8.3 mg/dL — ABNORMAL LOW (ref 8.9–10.3)
Chloride: 98 mmol/L (ref 98–111)
Creatinine, Ser: 1.03 mg/dL (ref 0.61–1.24)
GFR calc Af Amer: >60 mL/min (ref >60)
GFR calc non Af Amer: >60 mL/min (ref >60)
Glucose, Bld: 241 mg/dL — ABNORMAL HIGH (ref 70–99)
Potassium: 4.8 mmol/L (ref 3.5–5.1)
Sodium: 132 mmol/L — ABNORMAL LOW (ref 135–145)

## 2019-07-21 LAB — CBC
HCT: 26.1 % — ABNORMAL LOW (ref 39.0–52.0)
HCT: 26.9 % — ABNORMAL LOW (ref 39.0–52.0)
Hemoglobin: 7.7 g/dL — ABNORMAL LOW (ref 13.0–17.0)
Hemoglobin: 8 g/dL — ABNORMAL LOW (ref 13.0–17.0)
MCH: 20.5 pg — ABNORMAL LOW (ref 26.0–34.0)
MCH: 20.7 pg — ABNORMAL LOW (ref 26.0–34.0)
MCHC: 29.5 g/dL — ABNORMAL LOW (ref 30.0–36.0)
MCHC: 29.7 g/dL — ABNORMAL LOW (ref 30.0–36.0)
MCV: 69.6 fL — ABNORMAL LOW (ref 80.0–100.0)
MCV: 69.7 fL — ABNORMAL LOW (ref 80.0–100.0)
Platelets: 180 10*3/uL (ref 150–400)
Platelets: 174 10*3/uL (ref 150–400)
RBC: 3.75 MIL/uL — ABNORMAL LOW (ref 4.22–5.81)
RBC: 3.86 MIL/uL — ABNORMAL LOW (ref 4.22–5.81)
RDW: 17.2 % — ABNORMAL HIGH (ref 11.5–15.5)
RDW: 17 % — ABNORMAL HIGH (ref 11.5–15.5)
WBC: 4.8 10*3/uL (ref 4.0–10.5)
WBC: 5 10*3/uL (ref 4.0–10.5)
nRBC: 0 % (ref 0.0–0.2)
nRBC: 0 % (ref 0.0–0.2)

## 2019-07-21 LAB — VITAMIN B12: Vitamin B-12: 420 pg/mL (ref 180–914)

## 2019-07-21 LAB — IRON AND TIBC
Iron: 12 ug/dL — ABNORMAL LOW (ref 45–182)
Saturation Ratios: 6 % — ABNORMAL LOW (ref 17.9–39.5)
TIBC: 196 ug/dL — ABNORMAL LOW (ref 250–450)
UIBC: 184 ug/dL

## 2019-07-21 LAB — RETICULOCYTES
Immature Retic Fract: 9 % (ref 2.3–15.9)
RBC.: 3.72 MIL/uL — ABNORMAL LOW (ref 4.22–5.81)
Retic Count, Absolute: 37.6 10*3/uL (ref 19.0–186.0)
Retic Ct Pct: 1 % (ref 0.4–3.1)

## 2019-07-21 LAB — FOLATE: Folate: 25.9 ng/mL (ref >5.9)

## 2019-07-21 LAB — HEMOGLOBIN A1C
Hgb A1c MFr Bld: 7.7 % — ABNORMAL HIGH (ref 4.8–5.6)
Mean Plasma Glucose: 174.29 mg/dL

## 2019-07-21 LAB — ECHOCARDIOGRAM COMPLETE
Height: 72 in
Weight: 2659.2 oz

## 2019-07-21 LAB — HEPARIN LEVEL (UNFRACTIONATED)
Heparin Unfractionated: <0.1 IU/mL — ABNORMAL LOW (ref 0.30–0.70)
Heparin Unfractionated: 0.22 IU/mL — ABNORMAL LOW (ref 0.30–0.70)
Heparin Unfractionated: 0.33 IU/mL (ref 0.30–0.70)

## 2019-07-21 LAB — FERRITIN: Ferritin: 240 ng/mL (ref 24–336)

## 2019-07-21 LAB — GLUCOSE, CAPILLARY
Glucose-Capillary: 214 mg/dL — ABNORMAL HIGH (ref 70–99)
Glucose-Capillary: 355 mg/dL — ABNORMAL HIGH (ref 70–99)
Glucose-Capillary: 255 mg/dL — ABNORMAL HIGH (ref 70–99)

## 2019-07-21 MED ORDER — HEPARIN BOLUS VIA INFUSION
2000.0000 [IU] | Freq: Once | INTRAVENOUS | Status: AC
  Administered 2019-07-21: 2000 [IU] via INTRAVENOUS
  Filled 2019-07-21: qty 2000

## 2019-07-21 MED ORDER — INSULIN GLARGINE 100 UNIT/ML ~~LOC~~ SOLN
5.0000 [IU] | Freq: Every day | SUBCUTANEOUS | Status: DC
  Administered 2019-07-21: 5 [IU] via SUBCUTANEOUS
  Filled 2019-07-21 (×2): qty 0.05

## 2019-07-21 MED ORDER — SODIUM CHLORIDE 0.9 % IV SOLN
2.0000 g | INTRAVENOUS | Status: DC
  Administered 2019-07-21 – 2019-07-25 (×23): 2 g via INTRAVENOUS
  Filled 2019-07-21 (×11): qty 2
  Filled 2019-07-21 (×2): qty 2000
  Filled 2019-07-21: qty 2
  Filled 2019-07-21 (×3): qty 2000
  Filled 2019-07-21 (×2): qty 2
  Filled 2019-07-21 (×2): qty 2000
  Filled 2019-07-21 (×3): qty 2
  Filled 2019-07-21 (×2): qty 2000
  Filled 2019-07-21 (×3): qty 2

## 2019-07-21 MED ORDER — SODIUM CHLORIDE 0.9 % IV SOLN
INTRAVENOUS | Status: DC | PRN
  Administered 2019-07-21: 1000 mL via INTRAVENOUS

## 2019-07-21 MED ORDER — BENZONATATE 100 MG PO CAPS
100.0000 mg | ORAL_CAPSULE | Freq: Three times a day (TID) | ORAL | Status: DC | PRN
  Administered 2019-07-25: 100 mg via ORAL
  Filled 2019-07-21: qty 1

## 2019-07-21 MED ORDER — SODIUM CHLORIDE 0.9 % IV SOLN
2.0000 g | Freq: Two times a day (BID) | INTRAVENOUS | Status: DC
  Administered 2019-07-21 – 2019-07-25 (×9): 2 g via INTRAVENOUS
  Filled 2019-07-21 (×9): qty 20

## 2019-07-21 MED ORDER — SODIUM CHLORIDE 0.9 % IV SOLN
510.0000 mg | INTRAVENOUS | Status: DC
  Administered 2019-07-22: 510 mg via INTRAVENOUS
  Filled 2019-07-21: qty 17

## 2019-07-21 MED ORDER — SODIUM CHLORIDE 0.9 % IV SOLN
40.0000 mg | Freq: Once | INTRAVENOUS | Status: AC
  Administered 2019-07-22: 40 mg via INTRAVENOUS
  Filled 2019-07-21 (×2): qty 4

## 2019-07-21 MED ORDER — SODIUM CHLORIDE 0.9 % IV SOLN: INTRAVENOUS | Status: AC

## 2019-07-21 NOTE — Consult Note (Addendum)
Hospital Consult    Reason for Consult:  SMA thrombosis Requesting Physician:  Renard Hamper, NP MRN #:  [id number]  History of Present Illness: This is a 64 y.o. male who presented to [facility] with complaints of abdominal pain.  Pt states he was diagnosed with covid19 in October.  He was treated with IV steroids, remdesivir, Actemra and convalescent plasma.  He has needed supplemental oxygen at home.   He and his wife state he was healthy besides high blood pressure before the diagnosis.  Starting around December, he started having the abdominal pain.  He did not feel it was related to eating.  He states he was told it was musculoskeletal in nature as he also had some burning that was present around his lower back.  Pt thought he might be having kidney issues.  He has had a 40lb weight loss since October. He states he now has pulmonary fibrosis.    When he was being worked up in [place], he underwent CTA of the abdomen and this revealed a distal SMA thrombosis.  He was transferred to [facility] for further evaluation.  He underwent a TTE here and was found to have a possible aortic vegetation.  He does not have leukocytosis.  He does have anemia and this is currently being worked up.  He denies any melena.   Back in January 2021, he was having headaches and underwent evaluation with MRI of his head and was found to have subacute small hemorrhagic infarct in the left occipital lobe.   The pt is not on a statin for cholesterol management.  The pt is on a daily aspirin.   Other AC:  Heparin gtt The pt is on CCB for hypertension.   The pt is diabetic.   Tobacco hx:  Former-quit 2001  Past Medical History:  Diagnosis Date  . Cancer ([place])    skin cancer face- basal  . Chronic respiratory failure ([place])    due to IDL from covid  . Complication of anesthesia   . COVID-19 01/2019  . Diabetes ([place])    type 2 x 5 yrs  . Hypertension    since age 56  . PONV (postoperative nausea and vomiting)  06/25/2017  . Stroke [facility])     Past Surgical History:  Procedure Laterality Date  . APPENDECTOMY    . CARDIAC CATHETERIZATION N/A 01/07/2015   Procedure: Left Heart Cath and Coronary Angiography;  Surgeon: Burnell Blanks, MD;  Location: [facility];  Service: Cardiovascular;  Laterality: N/A;  . CERVICAL DISC SURGERY     with a plate   . CHOLECYSTECTOMY    . COLONOSCOPY N/A 03/15/2013   Procedure: COLONOSCOPY;  Surgeon: Rogene Houston, MD;  [facility];  Service: Endoscopy;  Laterality: N/A;  245  . cyst removal of neck    . KNEE ARTHROSCOPY Right   . LUMBAR DISC SURGERY  06/25/2017   lumbar 5  . LUMBAR LAMINECTOMY/DECOMPRESSION MICRODISCECTOMY N/A 07/30/2017   Procedure: MICRODISCECTOMY RIGHT LUMBAR FIVE- SACRAL ONE;  Surgeon: Erline Levine, MD;  Location: [facility];  Service: Neurosurgery;  Laterality: N/A;  MICRODISCECTOMY RIGHT LUMBAR 5- SACRAL 1    Allergies  Allergen Reactions  . Invokana [Canagliflozin] Other (See Comments)    KETOACIDOSIS   . Percocet [Oxycodone-Acetaminophen] Itching  . Shellfish Allergy     Shrimp - possible Iodine allergy UNSPECIFIED REACTION   . Sulfa Antibiotics     UNSPECIFIED REACTION     Prior to Admission medications  Medication Sig Start Date End Date Taking? Authorizing Provider  amLODipine (NORVASC) 5 MG tablet Take 5 mg by mouth daily.   Yes [provider]  aspirin EC 81 MG tablet Take 81 mg by mouth daily.   Yes [provider]  benzonatate (TESSALON) 200 MG capsule Take 1 capsule (200 mg total) by mouth 3 (three) times daily as needed for cough. 06/19/19  Yes Lauraine Rinne, NP  ferrous sulfate 325 (65 FE) MG tablet Take 325 mg by mouth daily with breakfast.   Yes [provider]  Flaxseed, Linseed, (FLAXSEED OIL) 1200 MG CAPS Take 1 capsule by mouth daily.    Yes [provider]  HUMALOG KWIKPEN 100 UNIT/ML KiwkPen Inject 4-10 Units into the muscle as needed (Sliding scale).   07/26/17  Yes [provider]  Insulin Syringe-Needle U-100 25G X 1" 1 ML MISC For 4 times a day insulin SQ, 1 month supply. Diagnosis E11.65 Patient taking differently: 1 each by Other route See admin instructions. For 4 times a day insulin SQ, 1 month supply. Diagnosis E11.65 02/07/19  Yes Thurnell Lose, MD  lisinopril (PRINIVIL,ZESTRIL) 40 MG tablet Take 40 mg by mouth daily.  12/13/14  Yes [provider]  methocarbamol (ROBAXIN) 500 MG tablet Take 500 mg by mouth 2 (two) times daily. 07/17/19  Yes [provider]  NOVOLIN N RELION 100 UNIT/ML injection Inject 30-40 Units into the skin See admin instructions. Per sliding scale depends on blood sugar levels 11/06/14  Yes [provider]  ondansetron (ZOFRAN) 4 MG tablet Take 4 mg by mouth every 8 (eight) hours as needed for nausea/vomiting. 06/16/19  Yes [provider]  pantoprazole (PROTONIX) 40 MG tablet Take 40 mg by mouth daily.   Yes [provider]  Vitamin D, Ergocalciferol, (DRISDOL) 50000 UNITS CAPS capsule Take 1 capsule by mouth every Sunday.  12/11/14  Yes [provider]  chlorpheniramine-HYDROcodone (TUSSIONEX PENNKINETIC ER) 10-8 MG/5ML SUER Take 5 mLs by mouth at bedtime as needed for cough. Patient not taking: Reported on 07/20/2019 06/27/19   Lauraine Rinne, NP    Social History   Socioeconomic History  . Marital status: Married    Spouse name: Not on file  . Number of children: Not on file  . Years of education: Not on file  . Highest education level: Not on file  Occupational History  . Not on file  Tobacco Use  . Smoking status: Former Smoker    Packs/day: 2.00    Years: 32.00    Pack years: 64.00    Types: Cigarettes    Start date: 01/02/1969    Quit date: 07/03/1999    Years since quitting: 20.0  . Smokeless tobacco: Former Systems developer    Types: Chew  Substance and Sexual Activity  . Alcohol use: No    Alcohol/week: 0.0 standard drinks  . Drug use: No  .  Sexual activity: Not on file  Other Topics Concern  . Not on file  Social History Narrative  . Not on file   Social Determinants of Health   Financial Resource Strain:   . Difficulty of Paying Living Expenses:   Food Insecurity:   . Worried About Charity fundraiser in the Last Year:   . Arboriculturist in the Last Year:   Transportation Needs:   . Film/video editor (Medical):   [name] Kitchen Lack of Transportation (Non-Medical):   Physical Activity:   . Days of Exercise per Week:   .  Minutes of Exercise per Session:   Stress:   . Feeling of Stress :   Social Connections:   . Frequency of Communication with Friends and Family:   . Frequency of Social Gatherings with Friends and Family:   . Attends Religious Services:   . Active Member of Clubs or Organizations:   . Attends Archivist Meetings:   [name] Kitchen Marital Status:   Intimate Partner Violence:   . Fear of Current or Ex-Partner:   . Emotionally Abused:   [name] Kitchen Physically Abused:   . Sexually Abused:      History reviewed. No pertinent family history.  ROS: [x]  Positive   [ ]  Negative   [ ]  All sytems reviewed and are negative  Cardiac: [x]  aortic vegetation  [x]  SOB  [x]  DOE  Vascular: []  pain in legs while walking []  pain in legs at rest []  pain in legs at night []  non-healing ulcers []  hx of DVT []  swelling in legs  Pulmonary: [x]  pulmonary fibrosis secondary to covid [x]  home O2  Neurologic: [x]  hx of CVA []  mini stroke  Hematologic: [x]  anemia being worked up  Endocrine:   [x]  diabetes []  thyroid disease  GI [x]  abdominal pain   GU: []  CKD/renal failure []  HD--[]  M/W/F or []  T/T/S  Psychiatric: []  anxiety []  depression  Musculoskeletal: []  arthritis []  joint pain  Integumentary: []  rashes []  ulcers  Constitutional: []  fever []  chills   Physical Examination  Vitals:   07/21/19 0539 07/21/19 0900  BP: (!) 103/51 101/60  Pulse: 93 92  Resp: 19 20  Temp: 98.2 F (36.8 C) 98.6  F (37 C)  SpO2: 100% 100%   Body mass index is 22.54 kg/m.  General:  Male who appears frail Gait: Not observed HENT: WNL, normocephalic Pulmonary: mildly labored with conversation Cardiac: regular Abdomen:  soft, Non tender to palpation Skin: without rashes Vascular Exam/Pulses:  Right Left  Radial 2+ (normal) 2+ (normal)  AT 2+ (normal) 2+ (normal)  PT 2+ (normal) 2+ (normal)   Extremities: without ischemic changes, without Gangrene , without cellulitis; without open wounds;  Musculoskeletal: no muscle wasting or atrophy  Neurologic: A&O X 3;  No focal weakness or paresthesias are detected; speech is fluent/normal Psychiatric:  The pt has Normal affect.   CBC    Component Value Date/Time   WBC 4.8 07/21/2019 0236   RBC 3.75 (L) 07/21/2019 0236   RBC 3.72 (L) 07/21/2019 0236   HGB 7.7 (L) 07/21/2019 0236   HCT 26.1 (L) 07/21/2019 0236   PLT 180 07/21/2019 0236   MCV 69.6 (L) 07/21/2019 0236   MCH 20.5 (L) 07/21/2019 0236   MCHC 29.5 (L) 07/21/2019 0236   RDW 17.2 (H) 07/21/2019 0236   LYMPHSABS 1.2 06/19/2019 1523   MONOABS 0.6 06/19/2019 1523   EOSABS 0.0 06/19/2019 1523   BASOSABS 0.0 06/19/2019 1523    BMET    Component Value Date/Time   NA 132 (L) 07/21/2019 0236   K 4.8 07/21/2019 0236   CL 98 07/21/2019 0236   CO2 27 07/21/2019 0236   GLUCOSE 241 (H) 07/21/2019 0236   BUN 16 07/21/2019 0236   CREATININE 1.03 07/21/2019 0236   CALCIUM 8.3 (L) 07/21/2019 0236   GFRNONAA >60 07/21/2019 0236   GFRAA >60 07/21/2019 0236    COAGS: Lab Results  Component Value Date   INR 0.94 01/07/2015     Non-Invasive Vascular Imaging:   CTA abdomen/pelvis 07/19/2019 in [place]: IMPRESSION: 1. Distal occlusion of the  SMA extending into branch vessels, with surrounding wall enhancement and inflammatory/infiltrative changes. Favor embolic phenomena given the location, and presence of concurrent splenic and left renal infarcts. Possibly septic embolus. Consider  echocardiography to rule out cardiac source, as there is minimal atheromatous change in the aorta. Neoplastic process such as carcinoid or lymphoma considered unlikely given the lesion morphology, and lack of involvement of the SMV or regional adenopathy. The findings were reviewed with Dr. Polly Cobia, who concurs. 2. Interval evolution of 2 previously identified splenic infarcts. 3. Stable small wedge-shaped left renal infarct.  2D Echocardiogram 07/21/2019: IMPRESSIONS  1. Left ventricular ejection fraction, by estimation, is 60 to 65%. The  left ventricle has mildly decreased function. The left ventricle has no  regional wall motion abnormalities. Left ventricular diastolic parameters  were normal.  2. Right ventricular systolic function is normal. The right ventricular  size is normal.  3. Left atrial size was mild to moderately dilated.  4. The mitral valve is normal in structure. No evidence of mitral valve  regurgitation. No evidence of mitral stenosis.  5. Possible vegetation on AV seen best on 3 chamber and PSL suggest f/u  TEE to further evaluate . The aortic valve is tricuspid. Aortic valve  regurgitation is mild. Mild aortic valve sclerosis is present, with no  evidence of aortic valve stenosis.  6. The inferior vena cava is normal in size with greater than 50%  respiratory variability, suggesting right atrial pressure of 3 mmHg.    ASSESSMENT/PLAN: This is a 64 y.o. male with hx of covid19 in October 2020 now with SMA thrombosis  SMA thrombosis as well as splenic and renal infarcts with covid dx in October 2020: -pt has had a 40lb weight loss and new finding of distal SMA thrombosis.  Pt does not have classic sx of post prandial pain or fear of foot.  Difficult to determine if his weight loss is due to mesenteric ischemia or complications of covid.  He does have a possible vegetation on his aortic valve and could have embolized to his distal SMA.  Pt currently on heparin gtt.   Pt appears frail and would not tolerate any open abdominal procedure.  Dr. Carlis Abbott to evaluate CTA with partners and determine plan.  Possible mycotic issue vs tumor.  Anemia:   -pt with anemia currently being worked up with hematology consult.  Hgb 7.7 today down from 9.0 yesterday.  Pt denies melena.   Platelets trending downward as well at 180k today down from 259k yesterday and 300k in March.     Leontine Locket, PA-C [facility] [phone]  I have seen and evaluated the patient. I agree with the PA note as documented above.  64 year old male that vascular surgery has been consulted for SMA thrombus versus embolus.  Patient is 64 years old and had Covid back in October 2020 and has been severely deconditioned and was ultimately made DNR following his hospital stay.  He has been very debilitated at home and has had vague abdominal symptoms since December 2020.  Ultimately CTA done in [place] several days ago revealed distal SMA thrombus versus embolus also with splenic and renal infarcts.  Certainly raises likely etiology for embolic event.  Echocardiogram here shows likely aortic valve vegetation.  On exam he has no significant abdominal pain and has a fairly benign exam.  He states he can eat and has no postprandial pain.  He has lost 40 pounds since his Covid diagnosis and states this is because  food does not taste good.  Unclear how chronic this finding on CT is given his vague abdominal pain since December.  He has a normal white count on admission.  Would favor heparin for now.  He needs anemic work-up as well as infectious work-up with blood cultures..  This SMA region of thrombus is in the distal SMA and would be very difficult to treat surgically.  It does look like there could be some degeneration of the artery in this segment and would raise questions of mycotic process.  Certainly pending his work-up for endocarditis as well as anemia and infectious work-up could  potentially entertain catheter angiography next week.  I do not think he needs anything emergent at this time.  Marty Heck, MD [facility] [place] Office: [phone]

## 2019-07-21 NOTE — Progress Notes (Signed)
  Echocardiogram 2D Echocardiogram has been performed.  [name] 07/21/2019, 9:14 AM

## 2019-07-21 NOTE — Progress Notes (Signed)
Progress Note    Nathan Perkins  [id number] DOB: 12-Mar-1956  DOA: 07/20/2019 PCP: Monico Blitz, MD    Brief Narrative:     Medical records reviewed and are as summarized below:  Nathan Perkins is an 64 y.o. 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  Heparin drip was initiated.  Assessment/Plan:   Principal Problem:   Superior mesenteric artery thrombosis (HCC) Active Problems:   Hypertension associated with diabetes ([place])   Microcytic anemia   ILD (interstitial lung disease) ([place])   Diabetes ([place])   Chronic respiratory failure ([place])  #1.  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  CT done outpatient revealed distal occlusion of the SMA extending into branch vessels with surrounding wall enhancement and inflammatory infiltrative changes, interval evolution of to previously identified splenic infarcts and stable small wedge-shaped left renal infarct. (viewable in PACS).  CTA of the chest negative for acute PE or thoracic aortic dissection, new splenic and left renal infarcts.  Heparin drip was initiated at the time of admission.  Of note chart review indicates that hematology was called and recommended continuing heparin and deferring hypercoagulable work-up at this time due to labs will be inaccurate in the setting of acute process.  Of note patient reports several weeks ago having left arm weakness numbness and had outpatient imaging and PCP told him he had "stroke but it was unclear when probably a week or 2 prior" -Continue heparin drip -Hold home aspirin -Monitor hemoglobin and platelets -Monitor signs and symptoms of bleeding -Follow echocardiogram  #2.  Microcytic anemia.  Hemoglobin  this morning 7.7 down from 9.0 yesterday.  Platelets are 180 down from 259 yesterday.  Anemia panel reveals iron level 12 with TIBC 196 and saturation ratio 6 and retic count 3.7. BP trending down somewhat and intermittent tachycardia.  -Gentle IV fluids -consider IV iron -consider transfusion -FOBT -monitor closely given he is on heparin gtt, will check cbc at 2pm unless decide to transfuse.   #3.  Diabetes type 2.  Poor control.  Insulin-dependent.  Hemoglobin A1c 7.7.  Of note since his illness in October patient's appetite has waxed and waned and he has had an unintentional weight loss of 40 pounds -Sliding scale -Monitor  #4.  Hypertension.  Home medications include amlodipine, lisinopril.  Blood pressure somewhat labile and on the low end of normal. -Gentle IV fluids -Hold lisinopril and amlodipine -Monitor closely -Resume home meds as indicated  #5.  Chronic respiratory failure secondary to post covid ILD.  Home oxygen at 2 L.  Currently oxygen saturation levels greater than 90% on 2 L.  Of note saw pulmonology in March of this year who recommended a CT of his chest in June.  He had a CTA prior to this admission noted above. -Monitor oxygen saturation level -Continue oxygen supplementation at baseline dose    Family Communication/Anticipated D/C date and plan/Code Status   DVT prophylaxis: heparin gtt ordered. Code Status: Full Code.  Family Communication: wife on phon Disposition Plan: Status is: Observation  The patient will require care spanning > 2 midnights and should be moved to inpatient because: Ongoing diagnostic testing needed not appropriate for outpatient work up  Dispo: The patient is from: Home  Anticipated d/c is to: Home              Anticipated d/c date is: 3 days              Patient currently is not medically stable to d/c.         Medical Consultants:    None.   Anti-Infectives:    None  Subjective:   Patient awake alert  reports intermittent lower abdominal pain mostly on the left.  Denies nausea vomiting.  Objective:    Vitals:   07/20/19 2200 07/20/19 2300 07/20/19 2333 07/21/19 0539  BP: (!) 96/52 (!) 101/55 128/62 (!) 103/51  Pulse: 93 96 98 93  Resp: 20 19 20 19   Temp:  97.8 F (36.6 C) 98.7 F (37.1 C) 98.2 F (36.8 C)  TempSrc:  Oral Oral Oral  SpO2: 99% 100% 100% 100%  Weight:   75.4 kg   Height:        Intake/Output Summary (Last 24 hours) at 07/21/2019 0835 Last data filed at 07/21/2019 0543 Gross per 24 hour  Intake 576.5 ml  Output 400 ml  Net 176.5 ml   Filed Weights   07/20/19 1405 07/20/19 2333  Weight: 73.5 kg 75.4 kg    Exam: General: Slightly pale acutely ill appearing but no acute distress CV: Regular rate and rhythm no murmur gallop or rub no lower extremity edema Respiratory: Mild increased work of breathing with conversation.  Breath sounds are distant but clear I hear no crackles no wheezes Abdomen: Nondistended nontender positive bowel sounds no guarding or rebounding Neuro: Awake alert oriented x3 speech clear facial symmetry bilateral grip 5 out of 5 Musculoskeletal: Joints without swelling/erythema full range of motion  Data Reviewed:   I have personally reviewed following labs and imaging studies:  Labs: Labs show the following:   Basic Metabolic Panel: Recent Labs  Lab 07/20/19 1448 07/21/19 0236  NA 132* 132*  K 4.4 4.8  CL 97* 98  CO2 28 27  GLUCOSE 157* 241*  BUN 16 16  CREATININE 0.97 1.03  CALCIUM 8.7* 8.3*   GFR Estimated Creatinine Clearance: 78.3 mL/min (by C-G formula based on SCr of 1.03 mg/dL). Liver Function Tests: Recent Labs  Lab 07/20/19 1448  AST 15  ALT 10  ALKPHOS 47  BILITOT 0.8  PROT 7.0  ALBUMIN 2.6*   No results for input(s): LIPASE, AMYLASE in the last 168 hours. No results for input(s): AMMONIA in the last 168 hours. Coagulation profile No results for input(s): INR, PROTIME in the last 168  hours.  CBC: Recent Labs  Lab 07/20/19 1448 07/21/19 0236  WBC 8.9 4.8  HGB 9.0* 7.7*  HCT 30.8* 26.1*  MCV 70.0* 69.6*  PLT 259 180   Cardiac Enzymes: No results for input(s): CKTOTAL, CKMB, CKMBINDEX, TROPONINI in the last 168 hours. BNP (last 3 results) No results for input(s): PROBNP in the last 8760 hours. CBG: Recent Labs  Lab 07/20/19 2331 07/21/19 0642  GLUCAP 114* 214*   D-Dimer: No results for input(s): DDIMER in the last 72 hours. Hgb A1c: Recent Labs    07/21/19 0236  HGBA1C 7.7*   Lipid Profile: No results for input(s): CHOL, HDL, LDLCALC, TRIG, CHOLHDL, LDLDIRECT in the last 72 hours. Thyroid function studies: No results for input(s): TSH, T4TOTAL, T3FREE, THYROIDAB in the last 72 hours.  Invalid input(s): FREET3 Anemia work up: Recent Labs    07/21/19 0236  VITAMINB12 420  FOLATE 25.9  FERRITIN 240  TIBC  196*  IRON 12*  RETICCTPCT 1.0   Sepsis Labs: Recent Labs  Lab 07/20/19 1448 07/21/19 0236  WBC 8.9 4.8    Microbiology Recent Results (from the past 240 hour(s))  SARS CORONAVIRUS 2 (TAT 6-24 HRS) Nasopharyngeal Nasopharyngeal Swab     Status: None   Collection Time: 07/20/19  5:49 PM   Specimen: Nasopharyngeal Swab  Result Value Ref Range Status   SARS Coronavirus 2 NEGATIVE NEGATIVE Final    Comment: (NOTE) SARS-CoV-2 target nucleic acids are NOT DETECTED. The SARS-CoV-2 RNA is generally detectable in upper and lower respiratory specimens during the acute phase of infection. Negative results do not preclude SARS-CoV-2 infection, do not rule out co-infections with other pathogens, and should not be used as the sole basis for treatment or other patient management decisions. Negative results must be combined with clinical observations, patient history, and epidemiological information. The expected result is Negative. Fact Sheet for Patients: SugarRoll.be Fact Sheet for Healthcare  Providers: https://www.woods-mathews.com/ This test is not yet approved or cleared by the [place] FDA and  has been authorized for detection and/or diagnosis of SARS-CoV-2 by FDA under an Emergency Use Authorization (EUA). This EUA will remain  in effect (meaning this test can be used) for the duration of the COVID-19 declaration under Section 56 4(b)(1) of the Act, 21 U.S.C. section 360bbb-3(b)(1), unless the authorization is terminated or revoked sooner. Performed at [facility], [place] [address] [place], [place] [address]     Procedures and diagnostic studies:  No results found.  Medications:   . insulin aspart  0-9 Units Subcutaneous TID WC  . pantoprazole  40 mg Oral Daily  . sodium chloride flush  3 mL Intravenous Q12H   Continuous Infusions: . sodium chloride    . heparin 1,450 Units/hr (07/21/19 0409)     LOS: 0 days   Radene Gunning NP [facility]   How to contact the [facility] Attending or Consulting provider [place] or covering provider during after hours [place], for this patient?  1. Check the care team in [facility] and look for a) attending/consulting TR[facility] provider listed and b) the [facility] team listed 2. Log into [email or web address] and use [facility] universal password to access. If you do not have the password, please contact the hospital operator. 3. Locate the [facility] provider you are looking for under [facility] and page to a number that you can be directly reached. 4. If you still have difficulty reaching the provider, please page the [facility] (Director on Call) for the Hospitalists listed on amion for assistance.  07/21/2019, 8:35 AM

## 2019-07-21 NOTE — Progress Notes (Signed)
ANTICOAGULATION CONSULT NOTE   Pharmacy Consult for Heparin Indication: renal/splenic infarct  Patient Measurements: Height: 6' (182.9 cm) Weight: 75.4 kg (166 lb 3.2 oz) IBW/kg (Calculated) : 77.6 Heparin Dosing Weight: 73.5 kg  Vital Signs: Temp: (P) 98.6 F (37 C) (04/16 1735) Temp Source: (P) Oral (04/16 1735) BP: (P) 114/89 (04/16 1735) Pulse Rate: (P) 76 (04/16 1735)  Labs: Recent Labs    07/20/19 1448 07/20/19 1448 07/21/19 0236 07/21/19 0934 07/21/19 1641  HGB 9.0*   < > 7.7*  --  8.0*  HCT 30.8*  --  26.1*  --  26.9*  PLT 259  --  180  --  174  HEPARINUNFRC  --   --  <0.10* 0.22* 0.33  CREATININE 0.97  --  1.03  --   --    < > = values in this interval not displayed.    Estimated Creatinine Clearance: 78.3 mL/min (by C-G formula based on SCr of 1.03 mg/dL).  Assessment: 64 yr old male presented from PCP with renal/splenic infarct. Pharmacy was consulted to dose heparin; no anticoagulation noted PTA. Of note, per H&P, the patient was noted to have a recent subacute small hemorrhagic infarct on MRI in January 2021. 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  Heparin level ~6 hrs after heparin infusion was increased to 1600 units/hr was 0.33 units/ml, which is within the goal range for this pt. H/H, platelets stable. Per RN, no issues with IV or bleeding observed.  Goal of Therapy:  Heparin level: 0.3-0.5 units/ml Monitor platelets by anticoagulation protocol: Yes   Plan:  Continue heparin infusion at 1600 units/hr Check confirmatory heparin level in 6 hours Monitor daily heparin level, CBC Monitor for signs/symptoms of bleeding  Thank you for allowing pharmacy to be a part of this patient's care.  Gillermina Hu, PharmD, BCPS, [facility] Clinical Pharmacist 07/21/2019 6:08 PM

## 2019-07-21 NOTE — Progress Notes (Signed)
Inpatient Diabetes Program Recommendations  AACE/ADA: New Consensus Statement on Inpatient Glycemic Control (2015)  Target Ranges:  Prepandial:   less than 140 mg/dL      Peak postprandial:   less than 180 mg/dL (1-2 hours)      Critically ill patients:  140 - 180 mg/dL   Lab Results  Component Value Date   GLUCAP 355 (H) 07/21/2019   HGBA1C 7.7 (H) 07/21/2019    Review of Glycemic Control  Diabetes history: DM 2 Outpatient Diabetes medications: NPH 30-40 units based on glucose levels (unsure how many times a day) Humalog 4-10 units as needed Current orders for Inpatient glycemic control:  Novolog 0-9 units tid  A1c 7.7% this admission not accurate due to Hgb BUN/Creat: 16/1.03  Inpatient Diabetes Program Recommendations:    On intermediate acting insulin at home. Consider starting Levemir 10 units bid.  Thanks,  Tama Headings RN, MSN, BC-ADM Inpatient Diabetes Coordinator Team Pager [phone] (8a-5p)

## 2019-07-21 NOTE — Progress Notes (Signed)
ANTICOAGULATION CONSULT NOTE   Pharmacy Consult for Heparin Indication: renal/splenic infarct  Patient Measurements: Height: 6' (182.9 cm) Weight: 75.4 kg (166 lb 3.2 oz) IBW/kg (Calculated) : 77.6 Heparin Dosing Weight: 73.5 kg  Vital Signs: Temp: 98.2 F (36.8 C) (04/16 0539) Temp Source: Oral (04/16 0539) BP: 103/51 (04/16 0539) Pulse Rate: 93 (04/16 0539)  Labs: Recent Labs    07/20/19 1448 07/21/19 0236  HGB 9.0* 7.7*  HCT 30.8* 26.1*  PLT 259 180  HEPARINUNFRC  --  <0.10*  CREATININE 0.97 1.03    Estimated Creatinine Clearance: 78.3 mL/min (by C-G formula based on SCr of 1.03 mg/dL).   Assessment: 64 yo M presents from PCP with renal/splenic infarct. Pharmacy asked to dose heparin. No AC noted PTA. Of noting, per H&P the patient was noted to have a recent subacute small hemorrhagic infarct on MRI.   Heparin level this morning remains SUBtherapeutic after a rate increase earlier today but is uptrending (HL 0.22 << undetectable). 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 Hgb down to 7.7 << 9 - noted low iron stores, no active bleeding noted.   Goal of Therapy:  Heparin level 0.3-0.7 units/ml Monitor platelets by anticoagulation protocol: Yes   Plan:  - Increase Heparin to 1600 units/hr - Will continue to monitor for any signs/symptoms of bleeding and will follow up with heparin level in 6 hours   Thank you for allowing pharmacy to be a part of this patient's care.  Alycia Rossetti, PharmD, BCPS Clinical Pharmacist Clinical phone for 07/21/2019: [phone] 07/21/2019 9:15 AM   **Pharmacist phone directory can now be found on [email or web address] ([email or web address]).  Listed under [place].

## 2019-07-21 NOTE — Consult Note (Addendum)
[facility] for Infectious Disease       Reason for Consult: Enterococcal bacteremia    Referring Physician: Dr. 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 insulin aspart  0-9 Units Subcutaneous TID WC  . pantoprazole  40 mg Oral Daily  . 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    Antibiotics: ampicillin Added ceftriaxone  HPI: Nathan Perkins is a 64 y.o. male with history of COVID infection in October 2020 and post COVID ILD and progressive decline who came in with abdominal pain. CT at [facility] with splenic and renal infarcts and SMA occlusion concerning for septic emboli.  TTE c/w possible aortic vegetation and now 1 set of blood cultures here positive for Enterococcus.  He has had about a 40 lb weight loss since his COVID diagnosis.   Wife at bedside   Review of Systems:  Constitutional: negative for fevers and chills Gastrointestinal: negative for nausea and diarrhea All other systems reviewed and are negative    Past Medical History:  Diagnosis Date  . Cancer ([place])    skin cancer face- basal  . Chronic respiratory failure ([place])    due to IDL from covid  . Complication of anesthesia   . COVID-19 01/2019  . Diabetes ([place])    type 2 x 5 yrs  . Hypertension    since age 57  . PONV (postoperative nausea and vomiting) 06/25/2017  . Stroke [facility])     Social History   Tobacco Use  . Smoking status: Former Smoker    Packs/day: 2.00    Years: 32.00    Pack years: 64.00    Types: Cigarettes    Start date:  01/02/1969    Quit date: 07/03/1999    Years since quitting: 20.0  . Smokeless tobacco: Former Systems developer    Types: Chew  Substance Use Topics  . Alcohol use: No    Alcohol/week: 0.0 standard drinks  . Drug use: No    FMH: + cardiac disease  Allergies  Allergen Reactions  . Invokana [Canagliflozin] Other (See Comments)    KETOACIDOSIS   . Percocet [Oxycodone-Acetaminophen] Itching  . Shellfish Allergy     Shrimp - possible Iodine allergy UNSPECIFIED REACTION   . Sulfa Antibiotics     UNSPECIFIED REACTION     Physical Exam: Constitutional: in no apparent distress  Vitals:   07/21/19 0539 07/21/19 0900  BP: (!) 103/51 101/60  Pulse: 93 92  Resp: 19 20  Temp: 98.2 F (36.8 C) 98.6 F (37 C)  SpO2: 100% 100%   EYES: anicteric Cardiovascular: Cor RRR Respiratory: clear; GI: soft Musculoskeletal: no pedal edema noted Skin: negatives: no rash Neuro: non-focal  Lab Results  Component Value Date   WBC 4.8 07/21/2019   HGB 7.7 (L) 07/21/2019   HCT 26.1 (L) 07/21/2019   MCV 69.6 (L) 07/21/2019   PLT 180 07/21/2019    Lab Results  Component Value Date   CREATININE 1.03 07/21/2019   BUN 16 07/21/2019   NA 132 (L) 07/21/2019  K 4.8 07/21/2019   CL 98 07/21/2019   CO2 27 07/21/2019    Lab Results  Component Value Date   ALT 10 07/20/2019   AST 15 07/20/2019   ALKPHOS 47 07/20/2019     Microbiology: Recent Results (from the past 240 hour(s))  SARS CORONAVIRUS 2 (TAT 6-24 HRS) Nasopharyngeal Nasopharyngeal Swab     Status: None   Collection Time: 07/20/19  5:49 PM   Specimen: Nasopharyngeal Swab  Result Value Ref Range Status   SARS Coronavirus 2 NEGATIVE NEGATIVE Final    Comment: (NOTE) SARS-CoV-2 target nucleic acids are NOT DETECTED. The SARS-CoV-2 RNA is generally detectable in upper and lower respiratory specimens during the acute phase of infection. Negative results do not preclude SARS-CoV-2 infection, do not rule out co-infections with other  pathogens, and should not be used as the sole basis for treatment or other patient management decisions. Negative results must be combined with clinical observations, patient history, and epidemiological information. The expected result is Negative. Fact Sheet for Patients: SugarRoll.be Fact Sheet for Healthcare Providers: https://www.woods-mathews.com/ This test is not yet approved or cleared by the [place] FDA and  has been authorized for detection and/or diagnosis of SARS-CoV-2 by FDA under an Emergency Use Authorization (EUA). This EUA will remain  in effect (meaning this test can be used) for the duration of the COVID-19 declaration under Section 56 4(b)(1) of the Act, 21 U.S.C. section 360bbb-3(b)(1), unless the authorization is terminated or revoked sooner. Performed at [facility], [place] [address], [place], [place] [address]   Blood culture (routine x 2)     Status: None (Preliminary result)   Collection Time: 07/20/19  6:39 PM   Specimen: BLOOD  Result Value Ref Range Status   Specimen Description BLOOD LEFT ANTECUBITAL  Final   Special Requests   Final    BOTTLES DRAWN AEROBIC AND ANAEROBIC Blood Culture results may not be optimal due to an inadequate volume of blood received in culture bottles   Culture   Final    NO GROWTH < 24 HOURS Performed at [facility], [place] [address], [place], [place] [address]    Report Status PENDING  Incomplete  Blood culture (routine x 2)     Status: None (Preliminary result)   Collection Time: 07/20/19  6:39 PM   Specimen: BLOOD  Result Value Ref Range Status   Specimen Description BLOOD RIGHT ANTECUBITAL  Final   Special Requests   Final    BOTTLES DRAWN AEROBIC AND ANAEROBIC Blood Culture results may not be optimal due to an inadequate volume of blood received in culture bottles   Culture  Setup Time   Final    GRAM POSITIVE COCCI IN CHAINS IN BOTH AEROBIC AND ANAEROBIC  BOTTLES Organism ID to follow CRITICAL RESULT CALLED TO, READ BACK BY AND VERIFIED WITHHughie Closs [id number] 07/21/19 A BROWNING Performed at [facility], [place] [address] [place], [place] [address]    Culture PENDING  Incomplete   Report Status PENDING  Incomplete  Blood Culture ID Panel (Reflexed)     Status: Abnormal   Collection Time: 07/20/19  6:39 PM  Result Value Ref Range Status   Enterococcus species DETECTED (A) NOT DETECTED Final    Comment: CRITICAL RESULT CALLED TO, READ BACK BY AND VERIFIED WITH: [name] PHARMD [id number] 07/21/19 A BROWNING    Vancomycin resistance NOT DETECTED NOT DETECTED Final   Listeria monocytogenes NOT DETECTED NOT DETECTED Final  Staphylococcus species NOT DETECTED NOT DETECTED Final   Staphylococcus aureus (BCID) NOT DETECTED NOT DETECTED Final   Streptococcus species NOT DETECTED NOT DETECTED Final   Streptococcus agalactiae NOT DETECTED NOT DETECTED Final   Streptococcus pneumoniae NOT DETECTED NOT DETECTED Final   Streptococcus pyogenes NOT DETECTED NOT DETECTED Final   Acinetobacter baumannii NOT DETECTED NOT DETECTED Final   Enterobacteriaceae species NOT DETECTED NOT DETECTED Final   Enterobacter cloacae complex NOT DETECTED NOT DETECTED Final   Escherichia coli NOT DETECTED NOT DETECTED Final   Klebsiella oxytoca NOT DETECTED NOT DETECTED Final   Klebsiella pneumoniae NOT DETECTED NOT DETECTED Final   Proteus species NOT DETECTED NOT DETECTED Final   Serratia marcescens NOT DETECTED NOT DETECTED Final   Haemophilus influenzae NOT DETECTED NOT DETECTED Final   Neisseria meningitidis NOT DETECTED NOT DETECTED Final   Pseudomonas aeruginosa NOT DETECTED NOT DETECTED Final   Candida albicans NOT DETECTED NOT DETECTED Final   Candida glabrata NOT DETECTED NOT DETECTED Final   Candida krusei NOT DETECTED NOT DETECTED Final   Candida parapsilosis NOT DETECTED NOT DETECTED Final   Candida tropicalis NOT DETECTED NOT DETECTED Final     Comment: Performed at [facility], [place] [address], [place], [place] [address]    Mckinlee Dunk W [place], [place] for Infectious Disease [facility] [email or web address] 07/21/2019, 3:24 PM

## 2019-07-21 NOTE — H&P (View-Only) (Signed)
[facility] for Infectious Disease       Reason for Consult: Enterococcal bacteremia    Referring Physician: Dr. 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 insulin aspart  0-9 Units Subcutaneous TID WC  . pantoprazole  40 mg Oral Daily  . 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    Antibiotics: ampicillin Added ceftriaxone  HPI: Nathan Perkins is a 64 y.o. male with history of COVID infection in October 2020 and post COVID ILD and progressive decline who came in with abdominal pain. CT at [facility] with splenic and renal infarcts and SMA occlusion concerning for septic emboli.  TTE c/w possible aortic vegetation and now 1 set of blood cultures here positive for Enterococcus.  He has had about a 40 lb weight loss since his COVID diagnosis.   Wife at bedside   Review of Systems:  Constitutional: negative for fevers and chills Gastrointestinal: negative for nausea and diarrhea All other systems reviewed and are negative    Past Medical History:  Diagnosis Date  . Cancer ([place])    skin cancer face- basal  . Chronic respiratory failure ([place])    due to IDL from covid  . Complication of anesthesia   . COVID-19 01/2019  . Diabetes ([place])    type 2 x 5 yrs  . Hypertension    since age 26  . PONV (postoperative nausea and vomiting) 06/25/2017  . Stroke [facility])     Social History   Tobacco Use  . Smoking status: Former Smoker    Packs/day: 2.00    Years: 32.00    Pack years: 64.00    Types: Cigarettes    Start date:  01/02/1969    Quit date: 07/03/1999    Years since quitting: 20.0  . Smokeless tobacco: Former Systems developer    Types: Chew  Substance Use Topics  . Alcohol use: No    Alcohol/week: 0.0 standard drinks  . Drug use: No    FMH: + cardiac disease  Allergies  Allergen Reactions  . Invokana [Canagliflozin] Other (See Comments)    KETOACIDOSIS   . Percocet [Oxycodone-Acetaminophen] Itching  . Shellfish Allergy     Shrimp - possible Iodine allergy UNSPECIFIED REACTION   . Sulfa Antibiotics     UNSPECIFIED REACTION     Physical Exam: Constitutional: in no apparent distress  Vitals:   07/21/19 0539 07/21/19 0900  BP: (!) 103/51 101/60  Pulse: 93 92  Resp: 19 20  Temp: 98.2 F (36.8 C) 98.6 F (37 C)  SpO2: 100% 100%   EYES: anicteric Cardiovascular: Cor RRR Respiratory: clear; GI: soft Musculoskeletal: no pedal edema noted Skin: negatives: no rash Neuro: non-focal  Lab Results  Component Value Date   WBC 4.8 07/21/2019   HGB 7.7 (L) 07/21/2019   HCT 26.1 (L) 07/21/2019   MCV 69.6 (L) 07/21/2019   PLT 180 07/21/2019    Lab Results  Component Value Date   CREATININE 1.03 07/21/2019   BUN 16 07/21/2019   NA 132 (L) 07/21/2019  K 4.8 07/21/2019   CL 98 07/21/2019   CO2 27 07/21/2019    Lab Results  Component Value Date   ALT 10 07/20/2019   AST 15 07/20/2019   ALKPHOS 47 07/20/2019     Microbiology: Recent Results (from the past 240 hour(s))  SARS CORONAVIRUS 2 (TAT 6-24 HRS) Nasopharyngeal Nasopharyngeal Swab     Status: None   Collection Time: 07/20/19  5:49 PM   Specimen: Nasopharyngeal Swab  Result Value Ref Range Status   SARS Coronavirus 2 NEGATIVE NEGATIVE Final    Comment: (NOTE) SARS-CoV-2 target nucleic acids are NOT DETECTED. The SARS-CoV-2 RNA is generally detectable in upper and lower respiratory specimens during the acute phase of infection. Negative results do not preclude SARS-CoV-2 infection, do not rule out co-infections with other  pathogens, and should not be used as the sole basis for treatment or other patient management decisions. Negative results must be combined with clinical observations, patient history, and epidemiological information. The expected result is Negative. Fact Sheet for Patients: SugarRoll.be Fact Sheet for Healthcare Providers: https://www.woods-mathews.com/ This test is not yet approved or cleared by the [place] FDA and  has been authorized for detection and/or diagnosis of SARS-CoV-2 by FDA under an Emergency Use Authorization (EUA). This EUA will remain  in effect (meaning this test can be used) for the duration of the COVID-19 declaration under Section 56 4(b)(1) of the Act, 21 U.S.C. section 360bbb-3(b)(1), unless the authorization is terminated or revoked sooner. Performed at [facility], [place] [address] [place], [place] [address]   Blood culture (routine x 2)     Status: None (Preliminary result)   Collection Time: 07/20/19  6:39 PM   Specimen: BLOOD  Result Value Ref Range Status   Specimen Description BLOOD LEFT ANTECUBITAL  Final   Special Requests   Final    BOTTLES DRAWN AEROBIC AND ANAEROBIC Blood Culture results may not be optimal due to an inadequate volume of blood received in culture bottles   Culture   Final    NO GROWTH < 24 HOURS Performed at [facility], [place] [address], [place], [place] [address]    Report Status PENDING  Incomplete  Blood culture (routine x 2)     Status: None (Preliminary result)   Collection Time: 07/20/19  6:39 PM   Specimen: BLOOD  Result Value Ref Range Status   Specimen Description BLOOD RIGHT ANTECUBITAL  Final   Special Requests   Final    BOTTLES DRAWN AEROBIC AND ANAEROBIC Blood Culture results may not be optimal due to an inadequate volume of blood received in culture bottles   Culture  Setup Time   Final    GRAM POSITIVE COCCI IN CHAINS IN BOTH AEROBIC AND ANAEROBIC  BOTTLES Organism ID to follow CRITICAL RESULT CALLED TO, READ BACK BY AND VERIFIED WITHHughie Closs [id number] 07/21/19 A BROWNING Performed at [facility], [place] [address], [place], [place] [address]    Culture PENDING  Incomplete   Report Status PENDING  Incomplete  Blood Culture ID Panel (Reflexed)     Status: Abnormal   Collection Time: 07/20/19  6:39 PM  Result Value Ref Range Status   Enterococcus species DETECTED (A) NOT DETECTED Final    Comment: CRITICAL RESULT CALLED TO, READ BACK BY AND VERIFIED WITH: [name] PHARMD [id number] 07/21/19 A BROWNING    Vancomycin resistance NOT DETECTED NOT DETECTED Final   Listeria monocytogenes NOT DETECTED NOT DETECTED Final  Staphylococcus species NOT DETECTED NOT DETECTED Final   Staphylococcus aureus (BCID) NOT DETECTED NOT DETECTED Final   Streptococcus species NOT DETECTED NOT DETECTED Final   Streptococcus agalactiae NOT DETECTED NOT DETECTED Final   Streptococcus pneumoniae NOT DETECTED NOT DETECTED Final   Streptococcus pyogenes NOT DETECTED NOT DETECTED Final   Acinetobacter baumannii NOT DETECTED NOT DETECTED Final   Enterobacteriaceae species NOT DETECTED NOT DETECTED Final   Enterobacter cloacae complex NOT DETECTED NOT DETECTED Final   Escherichia coli NOT DETECTED NOT DETECTED Final   Klebsiella oxytoca NOT DETECTED NOT DETECTED Final   Klebsiella pneumoniae NOT DETECTED NOT DETECTED Final   Proteus species NOT DETECTED NOT DETECTED Final   Serratia marcescens NOT DETECTED NOT DETECTED Final   Haemophilus influenzae NOT DETECTED NOT DETECTED Final   Neisseria meningitidis NOT DETECTED NOT DETECTED Final   Pseudomonas aeruginosa NOT DETECTED NOT DETECTED Final   Candida albicans NOT DETECTED NOT DETECTED Final   Candida glabrata NOT DETECTED NOT DETECTED Final   Candida krusei NOT DETECTED NOT DETECTED Final   Candida parapsilosis NOT DETECTED NOT DETECTED Final   Candida tropicalis NOT DETECTED NOT DETECTED Final     Comment: Performed at [facility], [place] [address] [place], [place] [address]    Rhegan Trunnell W Le[place], [place] for Infectious Disease [facility] [email or web address] 07/21/2019, 3:24 PM

## 2019-07-21 NOTE — Progress Notes (Signed)
[facility] for Heparin Indication: renal/splenic infarct  Allergies  Allergen Reactions  . Invokana [Canagliflozin] Other (See Comments)    KETOACIDOSIS   . Percocet [Oxycodone-Acetaminophen] Itching  . Shellfish Allergy     Shrimp - possible Iodine allergy UNSPECIFIED REACTION   . Sulfa Antibiotics     UNSPECIFIED REACTION     Patient Measurements: Height: 6' (182.9 cm) Weight: 75.4 kg (166 lb 3.2 oz) IBW/kg (Calculated) : 77.6 Heparin Dosing Weight: 73.5 kg  Vital Signs: Temp: 98.7 F (37.1 C) (04/15 2333) Temp Source: Oral (04/15 2333) BP: 128/62 (04/15 2333) Pulse Rate: 98 (04/15 2333)  Labs: Recent Labs    07/20/19 1448 07/21/19 0236  HGB 9.0* 7.7*  HCT 30.8* 26.1*  PLT 259 180  HEPARINUNFRC  --  <0.10*  CREATININE 0.97 1.03    Estimated Creatinine Clearance: 78.3 mL/min (by C-G formula based on SCr of 1.03 mg/dL).   Assessment: 64 yo M presents from PCP with renal/splenic infarct. Pharmacy asked to dose heparin. No AC noted PTA.  Heparin level undetectable on gtt at 1200 units/hr. No issues with line or bleeding reported per RN. Hgb down to 7.7 - will watch carefully  Goal of Therapy:  Heparin level 0.3-0.7 units/ml Monitor platelets by anticoagulation protocol: Yes   Plan:  Rebolus 2000 units now Increase heparin infusion to 1450 units/hr  Check 6 hr HL  Sherlon Handing, PharmD, BCPS Please see amion for complete clinical pharmacist phone list 07/21/2019  3:52 AM

## 2019-07-21 NOTE — Progress Notes (Signed)
PHARMACY - PHYSICIAN COMMUNICATION CRITICAL VALUE ALERT - BLOOD CULTURE IDENTIFICATION (BCID)  Nathan Perkins is an 64 y.o. male who presented to [facility] on 07/20/2019 - pt with hx of COVID in Oct, now with ILD. Found to have splenic and renal infarcts and SMA occlusion concerning for septic emboli on admit.   Assessment:  1/2 blood cx w enterococcus, possible aortic veg  Name of physician (or Provider) Contacted: Dr Marylyn Ishihara, [place]  Current antibiotics: None  Changes to prescribed antibiotics recommended:  Add amp 2 g q4h Add CTX 2 g q12h ID consulted  Results for orders placed or performed during the hospital encounter of 07/20/19  Blood Culture ID Panel (Reflexed) (Collected: 07/20/2019  6:39 PM)  Result Value Ref Range   Enterococcus species DETECTED (A) NOT DETECTED   Vancomycin resistance NOT DETECTED NOT DETECTED   Listeria monocytogenes NOT DETECTED NOT DETECTED   Staphylococcus species NOT DETECTED NOT DETECTED   Staphylococcus aureus (BCID) NOT DETECTED NOT DETECTED   Streptococcus species NOT DETECTED NOT DETECTED   Streptococcus agalactiae NOT DETECTED NOT DETECTED   Streptococcus pneumoniae NOT DETECTED NOT DETECTED   Streptococcus pyogenes NOT DETECTED NOT DETECTED   Acinetobacter baumannii NOT DETECTED NOT DETECTED   Enterobacteriaceae species NOT DETECTED NOT DETECTED   Enterobacter cloacae complex NOT DETECTED NOT DETECTED   Escherichia coli NOT DETECTED NOT DETECTED   Klebsiella oxytoca NOT DETECTED NOT DETECTED   Klebsiella pneumoniae NOT DETECTED NOT DETECTED   Proteus species NOT DETECTED NOT DETECTED   Serratia marcescens NOT DETECTED NOT DETECTED   Haemophilus influenzae NOT DETECTED NOT DETECTED   Neisseria meningitidis NOT DETECTED NOT DETECTED   Pseudomonas aeruginosa NOT DETECTED NOT DETECTED   Candida albicans NOT DETECTED NOT DETECTED   Candida glabrata NOT DETECTED NOT DETECTED   Candida krusei NOT DETECTED NOT DETECTED   Candida parapsilosis  NOT DETECTED NOT DETECTED   Candida tropicalis NOT DETECTED NOT DETECTED   Barth Kirks, PharmD, BCPS, BCCCP Clinical Pharmacist [phone]  Please check AMION for all [place] numbers  07/21/2019 4:42 PM

## 2019-07-21 NOTE — Consult Note (Addendum)
Nathan Perkins  [phone] [phone]    Nathan Perkins  Referring MD:  Dr. 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  The patient had developed intermittent, severe, epigastric to left upper quadrant abdominal pain over the past 3 weeks.  He was seen by his primary care provider and pulmonologist and underwent imaging at an outside hospital.  He had a CT angiogram of the chest performed on 07/14/2019 which was negative for PE or thoracic aortic dissection but did show new splenic and left renal infarcts at 2.6 cm poorly marginated masslike area in the central mesentery associate with occlusion of the SMA which was incompletely visualized.  He then had a CT angiogram of the abdomen pelvis performed on 07/19/2019 which showed distal occlusion of the SMA extending into branch vessels with surrounding wall enhancement and inflammatory/infiltrative changes-favor embolic phenomena given location and presence of concurrent splenic and left renal infarcts.  Could also represent a possible septic embolus.  Neoplastic process such as carcinoid or lymphoma considered unlikely given the lesion morphology and the lack of involvement of the SMV or regional adenopathy, interval evolution of to previously identified splenic infarcts, and stable small wedge-shaped left renal infarct.  Work-up in the emergency room was notable for hemoglobin of 9.0, MCV 70.0, sodium 132, calcium 8.7, and albumin 2.6.  Additional work-up for his anemia included a vitamin B12 level and folate level which were normal.  Ferritin was normal at 240, but iron was low at  12, TIBC was low at 196, and percent saturation was low at 6%.  Absolute reticulocyte count was normal at 37.6.  On 02/07/2020 (the day of discharge from his Covid hospitalization) his hemoglobin was normal at 13.6.  On 06/19/2019, his hemoglobin was noted to be 10.7 with a low MCV of 66.4.  Review of his prior hospital records show that during his hospitalization for COVID-19, he received Lovenox 60 mg daily and was instructed to take aspirin 325 mg daily x2 weeks and then resume 81 mg daily.  Also during that hospitalization he received IV dexamethasone, remdesivir, Actemra, and convalescent plasma.  Due to headaches and numbness, he had an MRI of the brain without contrast performed on 05/05/2019 which showed focus of hemorrhage in the left occipital lobe with surrounding FLAIR and T2 signal, most consistent with a small hemorrhagic infarction. The exact age of this is indeterminate but would favor late subacute.  The patient thinks that he had a "mini stroke" in November 2020 but did not seek work-up at that time.  Today, the patient reports abdominal pain for at least the past 3 weeks.  This been getting progressively worse.  Pain is diffuse.  His pain has improved since he was admitted to the hospital.  He also reports a burning sensation and pain around to his back that feels like a muscle spasm.  He reports anorexia and weight loss are probably 40 pounds since his COVID-19 diagnosis.  He currently denies headaches, dizziness, vision changes.  No recent fevers or chills.  No night sweats.  He denies chest pain.  He has baseline shortness of breath and wears O2 at 2 L at home.  He currently has no  nausea or vomiting.  Denies diarrhea but reports constipation and has not had a bowel movement in about 6 days.  He has not noticed any recent bleeding such as epistaxis, hemoptysis, hematemesis, hematuria, melena, hematochezia.  He does report history of having a colonoscopy at least 5 years ago which he reports is  normal.  The patient reports that he was quite weak after being hospitalized for COVID-19 was starting to feel better but is now much weaker than he used to be and can no longer work.  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  The patient denies personal family history of clotting disorders.  He is married and has 1 son.  He has a 64-pack-year history of smoking cigarettes and quit in 2001.  Denies alcohol use.  Hematology was asked see the patient make recommendations regarding his SMA thrombosis and renal and splenic infarcts.   Past Medical History:  Diagnosis Date  . Cancer ([place])    skin cancer face- basal  . Chronic respiratory failure ([place])    due to IDL from covid  . Complication of anesthesia   . COVID-19 01/2019  . Diabetes ([place])    type 2 x 5 yrs  . Hypertension    since age 33  . PONV (postoperative nausea and vomiting) 06/25/2017  . Stroke [facility])   :    Past Surgical History:  Procedure Laterality Date  . APPENDECTOMY    . CARDIAC CATHETERIZATION N/A 01/07/2015   Procedure: Left Heart Cath and Coronary Angiography;  Surgeon: Burnell Blanks, MD;  Location: [facility];  Service: Cardiovascular;  Laterality: N/A;  . CERVICAL DISC SURGERY     with a plate   . CHOLECYSTECTOMY    . COLONOSCOPY N/A 03/15/2013   Procedure: COLONOSCOPY;  Surgeon: Rogene Houston, MD;  Location[facility];  Service: Endoscopy;  Laterality: N/A;  245  . cyst removal of neck    . KNEE ARTHROSCOPY Right   . LUMBAR DISC SURGERY  06/25/2017   lumbar 5  . LUMBAR LAMINECTOMY/DECOMPRESSION MICRODISCECTOMY N/A 07/30/2017   Procedure: MICRODISCECTOMY RIGHT LUMBAR FIVE- SACRAL ONE;  Surgeon: Erline Levine, MD;  Location: [facility];  Service: Neurosurgery;  Laterality: N/A;  MICRODISCECTOMY RIGHT LUMBAR 5- SACRAL 1  :   CURRENT MEDS: Current Facility-Administered Medications  Medication Dose Route Frequency Provider Last Rate Last Admin  . 0.9 %  sodium  chloride infusion   Intravenous Continuous Radene Gunning, NP 75 mL/hr at 07/21/19 1024 New Bag at 07/21/19 1024  . acetaminophen (TYLENOL) tablet 650 mg  650 mg Oral Q6H PRN Lenore Cordia, MD       Or  . acetaminophen (TYLENOL) suppository 650 mg  650 mg Rectal Q6H PRN Zada Finders R, MD      . albuterol (PROVENTIL) (2.5 MG/3ML) 0.083% nebulizer solution 2.5 mg  2.5 mg Inhalation Q4H PRN Lenore Cordia, MD      . benzonatate (TESSALON) capsule 100 mg  100 mg Oral TID PRN Radene Gunning, NP      . heparin ADULT infusion 100 units/mL (25000 units/239mL sodium chloride 0.45%)  1,600 Units/hr Intravenous Continuous Rolla Flatten, RPH 16 mL/hr at 07/21/19 1149 1,600 Units/hr at 07/21/19 1149  . insulin aspart (novoLOG) injection 0-9 Units  0-9 Units Subcutaneous TID WC Lenore Cordia, MD   9 Units at 07/21/19 1144  . morphine 2 MG/ML injection 1 mg  1 mg Intravenous Q4H PRN Posey Pronto,  Vishal R, MD      . ondansetron (ZOFRAN) tablet 4 mg  4 mg Oral Q6H PRN Lenore Cordia, MD       Or  . ondansetron (ZOFRAN) injection 4 mg  4 mg Intravenous Q6H PRN Zada Finders R, MD      . oxyCODONE (Oxy IR/ROXICODONE) immediate release tablet 5 mg  5 mg Oral Q4H PRN Zada Finders R, MD      . pantoprazole (PROTONIX) EC tablet 40 mg  40 mg Oral Daily Zada Finders R, MD   40 mg at 07/21/19 1026  . senna-docusate (Senokot-S) tablet 1 tablet  1 tablet Oral QHS PRN Zada Finders R, MD      . sodium chloride flush (NS) 0.9 % injection 3 mL  3 mL Intravenous Q12H Lenore Cordia, MD          Allergies  Allergen Reactions  . Invokana [Canagliflozin] Other (See Comments)    KETOACIDOSIS   . Percocet [Oxycodone-Acetaminophen] Itching  . Shellfish Allergy     Shrimp - possible Iodine allergy UNSPECIFIED REACTION   . Sulfa Antibiotics     UNSPECIFIED REACTION   :  History reviewed. No pertinent family history.:  Social History   Socioeconomic History  . Marital status: Married    Spouse name: Not on  file  . Number of children: Not on file  . Years of education: Not on file  . Highest education level: Not on file  Occupational History  . Not on file  Tobacco Use  . Smoking status: Former Smoker    Packs/day: 2.00    Years: 32.00    Pack years: 64.00    Types: Cigarettes    Start date: 01/02/1969    Quit date: 07/03/1999    Years since quitting: 20.0  . Smokeless tobacco: Former Systems developer    Types: Chew  Substance and Sexual Activity  . Alcohol use: No    Alcohol/week: 0.0 standard drinks  . Drug use: No  . Sexual activity: Not on file  Other Topics Concern  . Not on file  Social History Narrative  . Not on file   Social Determinants of Health   Financial Resource Strain:   . Difficulty of Paying Living Expenses:   Food Insecurity:   . Worried About Charity fundraiser in the Last Year:   . Arboriculturist in the Last Year:   Transportation Needs:   . Film/video editor (Medical):   [name] Kitchen Lack of Transportation (Non-Medical):   Physical Activity:   . Days of Exercise per Week:   . Minutes of Exercise per Session:   Stress:   . Feeling of Stress :   Social Connections:   . Frequency of Communication with Friends and Family:   . Frequency of Social Gatherings with Friends and Family:   . Attends Religious Services:   . Active Member of Clubs or Organizations:   . Attends Archivist Meetings:   [name] Kitchen Marital Status:   Intimate Partner Violence:   . Fear of Current or Ex-Partner:   . Emotionally Abused:   [name] Kitchen Physically Abused:   . Sexually Abused:   :  REVIEW OF SYSTEMS:  The rest of the 14-point review of systems was negative.   Exam: Patient Vitals for the past 24 hrs:  BP Temp Temp src Pulse Resp SpO2 Height Weight  07/21/19 0900 101/60 98.6 F (37 C) Oral 92 20 100 % -- --  07/21/19 0539 [name] Kitchen)  103/51 98.2 F (36.8 C) Oral 93 19 100 % -- --  07/20/19 2333 128/62 98.7 F (37.1 C) Oral 98 20 100 % -- 75.4 kg  07/20/19 2300 (!) 101/55 97.8 F (36.6 C)  Oral 96 19 100 % -- --  07/20/19 2200 (!) 96/52 -- -- 93 20 99 % -- --  07/20/19 2100 122/70 -- -- 97 18 99 % -- --  07/20/19 2046 -- 99.1 F (37.3 C) Oral -- -- -- -- --  07/20/19 2000 94/62 -- -- 92 (!) 22 99 % -- --  07/20/19 1900 (!) 102/59 -- -- 94 16 100 % -- --  07/20/19 1800 (!) 103/58 -- -- 95 14 100 % -- --  07/20/19 1740 121/68 -- -- 99 16 99 % -- --  07/20/19 1405 119/79 98.9 F (37.2 C) Oral (!) 105 18 98 % 6' (1.829 m) 73.5 kg    General: Chronically ill-appearing male, no distress Eyes:  no scleral icterus.   ENT:  There were no oropharyngeal lesions.   Neck was without thyromegaly.   Lymphatics:  Negative cervical, supraclavicular or axillary adenopathy.   Respiratory: Distant breath sounds.   Cardiovascular:  Regular rate and rhythm, S1/S2, without murmur, rub or gallop.  There was no pedal edema.   GI: Positive bowel sounds, soft, nondistended, mild tenderness with palpation. Neuro exam was nonfocal. Patient was alert and oriented.  Attention was good.   Language was appropriate.  Mood was normal without depression.  Speech was not pressured.  Thought content was not tangential.    LABS:  Lab Results  Component Value Date   WBC 4.8 07/21/2019   HGB 7.7 (L) 07/21/2019   HCT 26.1 (L) 07/21/2019   PLT 180 07/21/2019   GLUCOSE 241 (H) 07/21/2019   TRIG 98 01/24/2019   ALT 10 07/20/2019   AST 15 07/20/2019   NA 132 (L) 07/21/2019   K 4.8 07/21/2019   CL 98 07/21/2019   CREATININE 1.03 07/21/2019   BUN 16 07/21/2019   CO2 27 07/21/2019   INR 0.94 01/07/2015   HGBA1C 7.7 (H) 07/21/2019    ECHOCARDIOGRAM COMPLETE  Result Date: 07/21/2019    ECHOCARDIOGRAM REPORT   Patient Name:   Nathan Perkins Date of Exam: 07/21/2019 Medical Rec #:  [id number]          Height:       72.0 in Accession #:    [id number]         Weight:       166.2 lb Date of Birth:  10-Aug-1955          BSA:          1.969 m Patient Age:    70 years           BP:           103/51 mmHg Patient  Gender: M                  HR:           93 bpm. Exam Location:  Inpatient Procedure: 2D Echo Indications:    Superior mesenteric artery thrombosis ([place]) [id number]  History:        Patient has no prior history of Echocardiogram examinations.                 Risk Factors:Diabetes and Hypertension. COVID pneumonia October                 2020.  Sonographer:    Darlina Sicilian RDCS Referring Phys: [id number] [place]  1. Left ventricular ejection fraction, by estimation, is 60 to 65%. The left ventricle has mildly decreased function. The left ventricle has no regional wall motion abnormalities. Left ventricular diastolic parameters were normal.  2. Right ventricular systolic function is normal. The right ventricular size is normal.  3. Left atrial size was mild to moderately dilated.  4. The mitral valve is normal in structure. No evidence of mitral valve regurgitation. No evidence of mitral stenosis.  5. Possible vegetation on AV seen best on 3 chamber and PSL suggest f/u TEE to further evaluate . The aortic valve is tricuspid. Aortic valve regurgitation is mild. Mild aortic valve sclerosis is present, with no evidence of aortic valve stenosis.  6. The inferior vena cava is normal in size with greater than 50% respiratory variability, suggesting right atrial pressure of 3 mmHg. FINDINGS  Left Ventricle: Left ventricular ejection fraction, by estimation, is 60 to 65%. The left ventricle has mildly decreased function. The left ventricle has no regional wall motion abnormalities. The left ventricular internal cavity size was normal in size. There is no left ventricular hypertrophy. Left ventricular diastolic parameters were normal. Right Ventricle: The right ventricular size is normal. No increase in right ventricular wall thickness. Right ventricular systolic function is normal. Left Atrium: Left atrial size was mild to moderately dilated. Right Atrium: Right atrial size was normal in size. Pericardium:  There is no evidence of pericardial effusion. Mitral Valve: The mitral valve is normal in structure. Normal mobility of the mitral valve leaflets. No evidence of mitral valve regurgitation. No evidence of mitral valve stenosis. Tricuspid Valve: The tricuspid valve is normal in structure. Tricuspid valve regurgitation is mild . No evidence of tricuspid stenosis. Aortic Valve: Possible vegetation on AV seen best on 3 chamber and PSL suggest f/u TEE to further evaluate. The aortic valve is tricuspid. Aortic valve regurgitation is mild. Aortic regurgitation PHT measures 195 msec. Mild aortic valve sclerosis is present, with no evidence of aortic valve stenosis. Pulmonic Valve: The pulmonic valve was normal in structure. Pulmonic valve regurgitation is not visualized. No evidence of pulmonic stenosis. Aorta: The aortic root is normal in size and structure. Venous: The inferior vena cava is normal in size with greater than 50% respiratory variability, suggesting right atrial pressure of 3 mmHg. IAS/Shunts: No atrial level shunt detected by color flow Doppler.  LEFT VENTRICLE PLAX 2D LVIDd:         5.38 cm  Diastology LVIDs:         3.53 cm  LV e' lateral:   9.25 cm/s LV PW:         1.23 cm  LV E/e' lateral: 9.2 LV IVS:        1.23 cm  LV e' medial:    6.64 cm/s LVOT diam:     2.00 cm  LV E/e' medial:  12.9 LV SV:         75 LV SV Index:   38 LVOT Area:     3.14 cm  RIGHT VENTRICLE RV S prime:     23.40 cm/s TAPSE (M-mode): 2.1 cm LEFT ATRIUM           Index       RIGHT ATRIUM           Index LA diam:      4.30 cm 2.18 cm/m  RA Area:     11.50 cm LA Vol (  A4C): 54.5 ml 27.67 ml/m RA Volume:   22.70 ml  11.53 ml/m  AORTIC VALVE LVOT Vmax:   132.00 cm/s LVOT Vmean:  77.300 cm/s LVOT VTI:    0.238 m AI PHT:      195 msec  AORTA Ao Root diam: 3.20 cm Ao Asc diam:  3.10 cm MITRAL VALVE MV Area (PHT): 6.07 cm    SHUNTS MV Decel Time: 125 msec    Systemic VTI:  0.24 m MV E velocity: 85.40 cm/s  Systemic Diam: 2.00 cm MV A  velocity: 65.50 cm/s MV E/A ratio:  1.30 Jenkins Rouge MD Electronically signed by Jenkins Rouge MD Signature Date/Time: 07/21/2019/9:37:26 AM    Final    ASSESSMENT AND PLAN:  1.  Superior mesenteric artery thrombosis 2.  Splenic and renal infarcts 3.  Microcytic anemia 4.  History of COVID-19 pneumonia in October 2020 5.  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  Recent subacute small hemorrhagic infarct in the left occipital lobe, January 2021 7.  Diabetes mellitus 8.  Hypertension  -Outside CT scan shows distal SMA thrombus versus embolus with splenic and renal infarcts.  Echocardiogram from today shows probable aortic valve vegetation.  The patient has been started on a heparin drip.  He has also been seen by vascular surgery who recommends medical management with anticoagulation.  Continue heparin drip for now.  Recommend TEE for further evaluation of the possible vegetation on the aortic valve.  We can consider transition to warfarin or DOAC prior to discharge. -The patient has microcytic anemia which has developed in the past few months.  His hemoglobin today has drifted down to 7.7.  Ferritin is elevated (possibly reactive) however iron and percent saturation are low which is indicative of iron deficiency. There is no evidence of vitamin B12 or folate deficiency or hemolysis.  Recommend checking stool for occult blood.  May need repeat colonoscopy if positive.  We will need to consider starting him on oral iron or Feraheme.  Further recommendations per Dr. Marin Olp. -The patient has a recent history of a small hemorrhagic infarct in his left occipital lobe.  Will need to be very cautious with use of anticoagulation in this patient.  Monitor closely for any neurological changes.  Thank you for this referral.  Mikey Bussing, DNP, AGPCNP-BC, AOCNP Mon/Tues/Thurs/Fri 7am-5pm; Off Wednesdays Cell: [phone]  ADDENDUM: I saw and examined the patient.  I agree with the above assessment by Erasmo Downer.  I think we  certainly have an issue with respect to him having enterococci in his blood.  With the enterococci in his blood, the marked iron deficiency anemia, and weight loss, I think we really need to have a upper and lower endoscopy done on him to make sure there is no malignancy.  He clearly is iron deficient.  I know think that he is bleeding from the anticoagulation.  I would have him on some IV iron.  I would keep him on IV heparin for right now.  I must say that the superior mesenteric artery is certainly not a typical site for thromboembolic disease.  It certainly appear that there might be an endocarditis issue with respect to the aortic valve.  Again, there are several issues that are active right now.  I think he needs to be evaluated for underlying malignancy.  I realize that he had the COVID infection back in 2021.  Studies have definitely shown that COVID-19 does have a thromboembolic element to it.  Until we know that there is no invasive  procedures to be done, I would keep him on heparin.  Once we know that nothing more invasive is going to be done, then we can switch him over to one of the newer oral anticoagulants.  I probably will try him on Eliquis.  I realize this is twice a day dosing.  Again we will give him a dose of IV iron.  I am sure this will work.  My underlying concern is the possibility of a malignancy.  This is clearly a very complicated situation.  I know that several specialties are being involved to try to improve his current status.   Lattie Haw, MD  Psalm 147:3

## 2019-07-22 DIAGNOSIS — R7881 Bacteremia: Secondary | ICD-10-CM | POA: Diagnosis not present

## 2019-07-22 DIAGNOSIS — B952 Enterococcus as the cause of diseases classified elsewhere: Secondary | ICD-10-CM | POA: Diagnosis not present

## 2019-07-22 LAB — CBC
HCT: 28.7 % — ABNORMAL LOW (ref 39.0–52.0)
Hemoglobin: 8.4 g/dL — ABNORMAL LOW (ref 13.0–17.0)
MCH: 20.7 pg — ABNORMAL LOW (ref 26.0–34.0)
MCHC: 29.3 g/dL — ABNORMAL LOW (ref 30.0–36.0)
MCV: 70.7 fL — ABNORMAL LOW (ref 80.0–100.0)
Platelets: 191 10*3/uL (ref 150–400)
RBC: 4.06 MIL/uL — ABNORMAL LOW (ref 4.22–5.81)
RDW: 17.1 % — ABNORMAL HIGH (ref 11.5–15.5)
WBC: 5.5 10*3/uL (ref 4.0–10.5)
nRBC: 0 % (ref 0.0–0.2)

## 2019-07-22 LAB — BASIC METABOLIC PANEL
Anion gap: 11 (ref 5–15)
BUN: 19 mg/dL (ref 8–23)
CO2: 23 mmol/L (ref 22–32)
Calcium: 8.1 mg/dL — ABNORMAL LOW (ref 8.9–10.3)
Chloride: 96 mmol/L — ABNORMAL LOW (ref 98–111)
Creatinine, Ser: 0.97 mg/dL (ref 0.61–1.24)
GFR calc Af Amer: >60 mL/min (ref >60)
GFR calc non Af Amer: >60 mL/min (ref >60)
Glucose, Bld: 459 mg/dL — ABNORMAL HIGH (ref 70–99)
Potassium: 5.3 mmol/L — ABNORMAL HIGH (ref 3.5–5.1)
Sodium: 130 mmol/L — ABNORMAL LOW (ref 135–145)

## 2019-07-22 LAB — GLUCOSE, CAPILLARY
Glucose-Capillary: 420 mg/dL — ABNORMAL HIGH (ref 70–99)
Glucose-Capillary: 453 mg/dL — ABNORMAL HIGH (ref 70–99)
Glucose-Capillary: 389 mg/dL — ABNORMAL HIGH (ref 70–99)
Glucose-Capillary: 329 mg/dL — ABNORMAL HIGH (ref 70–99)

## 2019-07-22 LAB — OCCULT BLOOD X 1 CARD TO LAB, STOOL: Fecal Occult Bld: NEGATIVE

## 2019-07-22 LAB — HEPARIN LEVEL (UNFRACTIONATED): Heparin Unfractionated: 0.37 IU/mL (ref 0.30–0.70)

## 2019-07-22 MED ORDER — INSULIN GLARGINE 100 UNIT/ML ~~LOC~~ SOLN
15.0000 [IU] | Freq: Every day | SUBCUTANEOUS | Status: DC
  Filled 2019-07-22: qty 0.15

## 2019-07-22 MED ORDER — HYDROMORPHONE HCL 1 MG/ML IJ SOLN
1.0000 mg | Freq: Once | INTRAMUSCULAR | Status: AC
  Administered 2019-07-22: 1 mg via INTRAVENOUS
  Filled 2019-07-22: qty 1

## 2019-07-22 MED ORDER — SODIUM CHLORIDE 0.9 % IV SOLN: Freq: Once | INTRAVENOUS | Status: AC

## 2019-07-22 MED ORDER — INSULIN DETEMIR 100 UNIT/ML ~~LOC~~ SOLN
15.0000 [IU] | Freq: Two times a day (BID) | SUBCUTANEOUS | Status: DC
  Administered 2019-07-22: 15 [IU] via SUBCUTANEOUS
  Filled 2019-07-22 (×3): qty 0.15

## 2019-07-22 MED ORDER — ENSURE MAX PROTEIN PO LIQD
11.0000 [oz_av] | Freq: Three times a day (TID) | ORAL | Status: DC
  Administered 2019-07-22 – 2019-07-25 (×6): 11 [oz_av] via ORAL
  Filled 2019-07-22 (×12): qty 330

## 2019-07-22 MED ORDER — INSULIN GLARGINE 100 UNIT/ML ~~LOC~~ SOLN
10.0000 [IU] | Freq: Once | SUBCUTANEOUS | Status: AC
  Administered 2019-07-22: 10 [IU] via SUBCUTANEOUS
  Filled 2019-07-22: qty 0.1

## 2019-07-22 MED ORDER — INSULIN ASPART 100 UNIT/ML ~~LOC~~ SOLN
5.0000 [IU] | Freq: Three times a day (TID) | SUBCUTANEOUS | Status: DC
  Administered 2019-07-22 – 2019-07-23 (×3): 5 [IU] via SUBCUTANEOUS

## 2019-07-22 MED ORDER — ENSURE MAX PROTEIN PO LIQD: 11.0000 [oz_av] | Freq: Two times a day (BID) | ORAL | Status: DC

## 2019-07-22 MED ORDER — INSULIN ASPART 100 UNIT/ML ~~LOC~~ SOLN
13.0000 [IU] | Freq: Once | SUBCUTANEOUS | Status: AC
  Administered 2019-07-22: 13 [IU] via SUBCUTANEOUS

## 2019-07-22 NOTE — Progress Notes (Addendum)
[name] Kitchen  PROGRESS NOTE    Nathan Perkins  [id number] DOB: December 25, 1955 DOA: 07/20/2019 PCP: Monico Blitz, MD   Brief Narrative:   Nathan Perkins is an 64 y.o. 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  Heparin drip was initiated.  4/17Michela Pitcher he had a rough night d/t pain. RRT called. He denies pain this AM. Change lantus to levemir. Add scheduled novolog. Continue abx. Appreciate all consultants assistance.  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     - continuing heparin     - will speak with GI   Microcytic anemia     - iron deficient; s/p iron dosing     - Hgb is 8.4 this AM, follow  Diabetes type 2.  Poor control.  Insulin-dependent.      - change lantus to levemir 15 units BID     - schedule 5 units novolog WM  Hypertension.     - BP ok, monitor  Chronic respiratory failure secondary to post covid ILD.       - Home oxygen at 2      - continue 2L [place], he is stable  DVT prophylaxis: heparin Code Status: DNR   Status is: Inpatient  Remains inpatient appropriate because:Inpatient level of care appropriate due to severity of illness   Dispo: The patient is from: Home  Anticipated d/c is to: Home              Anticipated d/c date is: > 3 days              Patient currently is not medically stable to d/c.  Consultants:   Oncology  ID  Vascular Surgery  Antimicrobials:  . Ampicillin . rocephin   ROS:  Denies CP, ab pain, N, V . Remainder 10-pt ROS is negative for all not previously mentioned.  Subjective: "It was a rough night"  Objective: Vitals:   07/22/19 0219 07/22/19 0242 07/22/19 0251 07/22/19 0623  BP: (!) 156/77 122/69 (!) 143/73 (!) 104/54  Pulse: (!) 113 (!) 106 (!) 105 83  Resp: (!) 22 19 18 20   Temp: 98 F (36.7 C)   (!) 97.5 F (36.4 C)  TempSrc: Oral   Oral  SpO2: 99% 99% 99% 100%  Weight:      Height:        Intake/Output Summary (Last 24 hours) at 07/22/2019 1639 Last data filed at 07/22/2019 1108 Gross per 24 hour  Intake 1360.17 ml  Output 2750 ml  Net -1389.83 ml   Filed Weights   07/20/19 1405 07/20/19 2333  Weight: 73.5 kg 75.4 kg    Examination:  General: 64 y.o. ill appearing male resting in  bed Cardiovascular: RRR, +S1, S2, no m/g/r Respiratory: CTABL, no w/r/r, normal WOB GI: BS+, NDNT, soft MSK: No e/c/c Neuro: alert to name, follows commands Psyc: Appropriate interaction and affect, calm/cooperative   Data Reviewed: I have personally reviewed following labs and imaging studies.  CBC: Recent Labs  Lab 07/20/19 1448 07/21/19 0236 07/21/19 1641 07/22/19 0230  WBC 8.9 4.8 5.0 5.5  HGB 9.0* 7.7* 8.0* 8.4*  HCT 30.8* 26.1* 26.9* 28.7*  MCV 70.0* 69.6* 69.7* 70.7*  PLT 259 180 174 [id number]   Basic Metabolic Panel: Recent Labs  Lab 07/20/19 1448 07/21/19 0236 07/22/19 0230  NA 132* 132* 130*  K 4.4 4.8 5.3*  CL 97* 98 96*  CO2 28 27 23   GLUCOSE 157* 241* 459*  BUN 16 16 19   CREATININE 0.97 1.03 0.97  CALCIUM 8.7* 8.3* 8.1*   GFR: Estimated Creatinine Clearance: 83.1 mL/min (by C-G formula based on SCr of 0.97 mg/dL). Liver Function Tests: Recent Labs  Lab 07/20/19 1448  AST 15   ALT 10  ALKPHOS 47  BILITOT 0.8  PROT 7.0  ALBUMIN 2.6*   No results for input(s): LIPASE, AMYLASE in the last 168 hours. No results for input(s): AMMONIA in the last 168 hours. Coagulation Profile: No results for input(s): INR, PROTIME in the last 168 hours. Cardiac Enzymes: No results for input(s): CKTOTAL, CKMB, CKMBINDEX, TROPONINI in the last 168 hours. BNP (last 3 results) No results for input(s): PROBNP in the last 8760 hours. HbA1C: Recent Labs    07/21/19 0236  HGBA1C 7.7*   CBG: Recent Labs  Lab 07/21/19 0642 07/21/19 1127 07/21/19 1622 07/22/19 0648 07/22/19 0902  GLUCAP 214* 355* 255* 420* 453*   Lipid Profile: No results for input(s): CHOL, HDL, LDLCALC, TRIG, CHOLHDL, LDLDIRECT in the last 72 hours. Thyroid Function Tests: No results for input(s): TSH, T4TOTAL, FREET4, T3FREE, THYROIDAB in the last 72 hours. Anemia Panel: Recent Labs    07/21/19 0236  VITAMINB12 420  FOLATE 25.9  FERRITIN 240  TIBC 196*  IRON 12*  RETICCTPCT 1.0   Sepsis Labs: No results for input(s): PROCALCITON, LATICACIDVEN in the last 168 hours.  Recent Results (from the past 240 hour(s))  SARS CORONAVIRUS 2 (TAT 6-24 HRS) Nasopharyngeal Nasopharyngeal Swab     Status: None   Collection Time: 07/20/19  5:49 PM   Specimen: Nasopharyngeal Swab  Result Value Ref Range Status   SARS Coronavirus 2 NEGATIVE NEGATIVE Final    Comment: (NOTE) SARS-CoV-2 target nucleic acids are NOT DETECTED. The SARS-CoV-2 RNA is generally detectable in upper and lower respiratory specimens during the acute phase of infection. Negative results do not preclude SARS-CoV-2 infection, do not rule out co-infections with other pathogens, and should not be used as the sole basis for treatment or other patient management decisions. Negative results must be combined with clinical observations, patient history, and epidemiological information. The expected result is Negative. Fact Sheet for  Patients: SugarRoll.be Fact Sheet for Healthcare Providers: https://www.woods-mathews.com/ This test is not yet approved or cleared by the [place] FDA and  has been authorized for detection and/or diagnosis of SARS-CoV-2 by FDA under an Emergency Use Authorization (EUA). This EUA will remain  in effect (meaning this test can be used) for the duration of the COVID-19 declaration under Section 56 4(b)(1) of the Act, 21 U.S.C. section 360bbb-3(b)(1), unless the authorization is terminated or revoked sooner. Performed at [facility], [place] [address], [place], [place] [address]   Blood culture (routine x 2)  Status: None (Preliminary result)   Collection Time: 07/20/19  6:39 PM   Specimen: BLOOD  Result Value Ref Range Status   Specimen Description BLOOD LEFT ANTECUBITAL  Final   Special Requests   Final    BOTTLES DRAWN AEROBIC AND ANAEROBIC Blood Culture results may not be optimal due to an inadequate volume of blood received in culture bottles   Culture  Setup Time   Final    IN BOTH AEROBIC AND ANAEROBIC BOTTLES GRAM POSITIVE COCCI IN CHAINS CRITICAL VALUE NOTED.  VALUE IS CONSISTENT WITH PREVIOUSLY REPORTED AND CALLED VALUE.    Culture   Final    GRAM POSITIVE COCCI TOO YOUNG TO READ Performed at [facility], [place] [address] [place], [place] [address]    Report Status PENDING  Incomplete  Blood culture (routine x 2)     Status: Abnormal (Preliminary result)   Collection Time: 07/20/19  6:39 PM   Specimen: BLOOD  Result Value Ref Range Status   Specimen Description BLOOD RIGHT ANTECUBITAL  Final   Special Requests   Final    BOTTLES DRAWN AEROBIC AND ANAEROBIC Blood Culture results may not be optimal due to an inadequate volume of blood received in culture bottles   Culture  Setup Time   Final    GRAM POSITIVE COCCI IN CHAINS IN BOTH AEROBIC AND ANAEROBIC BOTTLES Organism ID to follow CRITICAL RESULT CALLED TO,  READ BACK BY AND VERIFIED WITHHughie Closs [id number] 07/21/19 A BROWNING    Culture (A)  Final    ENTEROCOCCUS FAECALIS SUSCEPTIBILITIES TO FOLLOW Performed at [facility], [place] [address], [place], [place] [address]    Report Status PENDING  Incomplete  Blood Culture ID Panel (Reflexed)     Status: Abnormal   Collection Time: 07/20/19  6:39 PM  Result Value Ref Range Status   Enterococcus species DETECTED (A) NOT DETECTED Final    Comment: CRITICAL RESULT CALLED TO, READ BACK BY AND VERIFIED WITH: M [place] [id number] 07/21/19 A BROWNING    Vancomycin resistance NOT DETECTED NOT DETECTED Final   Listeria monocytogenes NOT DETECTED NOT DETECTED Final   Staphylococcus species NOT DETECTED NOT DETECTED Final   Staphylococcus aureus (BCID) NOT DETECTED NOT DETECTED Final   Streptococcus species NOT DETECTED NOT DETECTED Final   Streptococcus agalactiae NOT DETECTED NOT DETECTED Final   Streptococcus pneumoniae NOT DETECTED NOT DETECTED Final   Streptococcus pyogenes NOT DETECTED NOT DETECTED Final   Acinetobacter baumannii NOT DETECTED NOT DETECTED Final   Enterobacteriaceae species NOT DETECTED NOT DETECTED Final   Enterobacter cloacae complex NOT DETECTED NOT DETECTED Final   Escherichia coli NOT DETECTED NOT DETECTED Final   Klebsiella oxytoca NOT DETECTED NOT DETECTED Final   Klebsiella pneumoniae NOT DETECTED NOT DETECTED Final   Proteus species NOT DETECTED NOT DETECTED Final   Serratia marcescens NOT DETECTED NOT DETECTED Final   Haemophilus influenzae NOT DETECTED NOT DETECTED Final   Neisseria meningitidis NOT DETECTED NOT DETECTED Final   Pseudomonas aeruginosa NOT DETECTED NOT DETECTED Final   Candida albicans NOT DETECTED NOT DETECTED Final   Candida glabrata NOT DETECTED NOT DETECTED Final   Candida krusei NOT DETECTED NOT DETECTED Final   Candida parapsilosis NOT DETECTED NOT DETECTED Final   Candida tropicalis NOT DETECTED NOT DETECTED Final    Comment:  Performed at [facility], [place]. [address], [place], [place] [address]      Radiology Studies: ECHOCARDIOGRAM COMPLETE  Result Date: 07/21/2019  ECHOCARDIOGRAM REPORT   Patient Name:   NELVIN MCCOWAN Date of Exam: 07/21/2019 Medical Rec #:  [id number]          Height:       72.0 in Accession #:    [id number]         Weight:       166.2 lb Date of Birth:  07-10-1955          BSA:          1.969 m Patient Age:    75 years           BP:           103/51 mmHg Patient Gender: M                  HR:           93 bpm. Exam Location:  Inpatient Procedure: 2D Echo Indications:    Superior mesenteric artery thrombosis ([place]) [id number]  History:        Patient has no prior history of Echocardiogram examinations.                 Risk Factors:Diabetes and Hypertension. COVID pneumonia October                 2020.  Sonographer:    Darlina Sicilian RDCS Referring Phys: [id number] [place]  1. Left ventricular ejection fraction, by estimation, is 60 to 65%. The left ventricle has mildly decreased function. The left ventricle has no regional wall motion abnormalities. Left ventricular diastolic parameters were normal.  2. Right ventricular systolic function is normal. The right ventricular size is normal.  3. Left atrial size was mild to moderately dilated.  4. The mitral valve is normal in structure. No evidence of mitral valve regurgitation. No evidence of mitral stenosis.  5. Possible vegetation on AV seen best on 3 chamber and PSL suggest f/u TEE to further evaluate . The aortic valve is tricuspid. Aortic valve regurgitation is mild. Mild aortic valve sclerosis is present, with no evidence of aortic valve stenosis.  6. The inferior vena cava is normal in size with greater than 50% respiratory variability, suggesting right atrial pressure of 3 mmHg. FINDINGS  Left Ventricle: Left ventricular ejection fraction, by estimation, is 60 to 65%. The left ventricle has mildly decreased function. The left  ventricle has no regional wall motion abnormalities. The left ventricular internal cavity size was normal in size. There is no left ventricular hypertrophy. Left ventricular diastolic parameters were normal. Right Ventricle: The right ventricular size is normal. No increase in right ventricular wall thickness. Right ventricular systolic function is normal. Left Atrium: Left atrial size was mild to moderately dilated. Right Atrium: Right atrial size was normal in size. Pericardium: There is no evidence of pericardial effusion. Mitral Valve: The mitral valve is normal in structure. Normal mobility of the mitral valve leaflets. No evidence of mitral valve regurgitation. No evidence of mitral valve stenosis. Tricuspid Valve: The tricuspid valve is normal in structure. Tricuspid valve regurgitation is mild . No evidence of tricuspid stenosis. Aortic Valve: Possible vegetation on AV seen best on 3 chamber and PSL suggest f/u TEE to further evaluate. The aortic valve is tricuspid. Aortic valve regurgitation is mild. Aortic regurgitation PHT measures 195 msec. Mild aortic valve sclerosis is present, with no evidence of aortic valve stenosis. Pulmonic Valve: The pulmonic valve was normal in structure. Pulmonic valve regurgitation is not visualized. No evidence of pulmonic stenosis.  Aorta: The aortic root is normal in size and structure. Venous: The inferior vena cava is normal in size with greater than 50% respiratory variability, suggesting right atrial pressure of 3 mmHg. IAS/Shunts: No atrial level shunt detected by color flow Doppler.  LEFT VENTRICLE PLAX 2D LVIDd:         5.38 cm  Diastology LVIDs:         3.53 cm  LV e' lateral:   9.25 cm/s LV PW:         1.23 cm  LV E/e' lateral: 9.2 LV IVS:        1.23 cm  LV e' medial:    6.64 cm/s LVOT diam:     2.00 cm  LV E/e' medial:  12.9 LV SV:         75 LV SV Index:   38 LVOT Area:     3.14 cm  RIGHT VENTRICLE RV S prime:     23.40 cm/s TAPSE (M-mode): 2.1 cm LEFT ATRIUM            Index       RIGHT ATRIUM           Index LA diam:      4.30 cm 2.18 cm/m  RA Area:     11.50 cm LA Vol (A4C): 54.5 ml 27.67 ml/m RA Volume:   22.70 ml  11.53 ml/m  AORTIC VALVE LVOT Vmax:   132.00 cm/s LVOT Vmean:  77.300 cm/s LVOT VTI:    0.238 m AI PHT:      195 msec  AORTA Ao Root diam: 3.20 cm Ao Asc diam:  3.10 cm MITRAL VALVE MV Area (PHT): 6.07 cm    SHUNTS MV Decel Time: 125 msec    Systemic VTI:  0.24 m MV E velocity: 85.40 cm/s  Systemic Diam: 2.00 cm MV A velocity: 65.50 cm/s MV E/A ratio:  1.30 Jenkins Rouge MD Electronically signed by Jenkins Rouge MD Signature Date/Time: 07/21/2019/9:37:26 AM    Final      Scheduled Meds: . insulin aspart  0-9 Units Subcutaneous TID WC  . insulin detemir  15 Units Subcutaneous BID  . pantoprazole  40 mg Oral Daily  . Ensure Max Protein  11 oz Oral TID BM  . sodium chloride flush  3 mL Intravenous Q12H   Continuous Infusions: . sodium chloride 1,000 mL (07/21/19 2147)  . ampicillin (OMNIPEN) IV 2 g (07/22/19 1519)  . cefTRIAXone (ROCEPHIN)  IV 2 g (07/22/19 0917)  . ferumoxytol 510 mg (07/22/19 1108)  . heparin 1,600 Units/hr (07/22/19 0249)     LOS: 1 day    Time spent: 35  minutes spent in the coordination of care today.    Jonnie Finner, DO [facility]  If 7PM-7AM, please contact night-coverage [email or web address] 07/22/2019, 4:39 PM

## 2019-07-22 NOTE — Progress Notes (Signed)
   07/22/19 [id number]  Provider Notification  Provider Name/Title Bl[place] NP  Date Provider Notified 07/22/19  Time Provider Notified [phone]  Notification Type Page  Notification Reason Change in status (CBG is 420)  Response See new orders  Date of Provider Response 07/22/19  Time of Provider Response [phone]   See new orders which will be implemented.  Will continue to monitor patient.  Earleen Reaper RN

## 2019-07-22 NOTE — Progress Notes (Signed)
[facility] for Infectious Disease   Reason for visit: Follow up on Enterococcal bacteremia  Interval History: blood cultures ordered.  Had an episode of pain yesterday.  No fever, WBC wnl.   Physical Exam: Constitutional:  Vitals:   07/22/19 0251 07/22/19 0623  BP: (!) 143/73 (!) 104/54  Pulse: (!) 105 83  Resp: 18 20  Temp:  (!) 97.5 F (36.4 C)  SpO2: 99% 100%   patient appears in NAD Respiratory: Normal respiratory effort; CTA B Cardiovascular: RRR GI: soft, nt, nd  Review of Systems: Constitutional: negative for fevers and chills Gastrointestinal: negative for nausea and diarrhea  Lab Results  Component Value Date   WBC 5.5 07/22/2019   HGB 8.4 (L) 07/22/2019   HCT 28.7 (L) 07/22/2019   MCV 70.7 (L) 07/22/2019   PLT 191 07/22/2019    Lab Results  Component Value Date   CREATININE 0.97 07/22/2019   BUN 19 07/22/2019   NA 130 (L) 07/22/2019   K 5.3 (H) 07/22/2019   CL 96 (L) 07/22/2019   CO2 23 07/22/2019    Lab Results  Component Value Date   ALT 10 07/20/2019   AST 15 07/20/2019   ALKPHOS 47 07/20/2019     Microbiology: Recent Results (from the past 240 hour(s))  SARS CORONAVIRUS 2 (TAT 6-24 HRS) Nasopharyngeal Nasopharyngeal Swab     Status: None   Collection Time: 07/20/19  5:49 PM   Specimen: Nasopharyngeal Swab  Result Value Ref Range Status   SARS Coronavirus 2 NEGATIVE NEGATIVE Final    Comment: (NOTE) SARS-CoV-2 target nucleic acids are NOT DETECTED. The SARS-CoV-2 RNA is generally detectable in upper and lower respiratory specimens during the acute phase of infection. Negative results do not preclude SARS-CoV-2 infection, do not rule out co-infections with other pathogens, and should not be used as the sole basis for treatment or other patient management decisions. Negative results must be combined with clinical observations, patient history, and epidemiological information. The expected result is Negative. Fact Sheet for  Patients: SugarRoll.be Fact Sheet for Healthcare Providers: https://www.woods-mathews.com/ This test is not yet approved or cleared by the [place] FDA and  has been authorized for detection and/or diagnosis of SARS-CoV-2 by FDA under an Emergency Use Authorization (EUA). This EUA will remain  in effect (meaning this test can be used) for the duration of the COVID-19 declaration under Section 56 4(b)(1) of the Act, 21 U.S.C. section 360bbb-3(b)(1), unless the authorization is terminated or revoked sooner. Performed at [facility], [place] [address], [place], [place] [address]   Blood culture (routine x 2)     Status: None (Preliminary result)   Collection Time: 07/20/19  6:39 PM   Specimen: BLOOD  Result Value Ref Range Status   Specimen Description BLOOD LEFT ANTECUBITAL  Final   Special Requests   Final    BOTTLES DRAWN AEROBIC AND ANAEROBIC Blood Culture results may not be optimal due to an inadequate volume of blood received in culture bottles   Culture  Setup Time   Final    IN BOTH AEROBIC AND ANAEROBIC BOTTLES GRAM POSITIVE COCCI IN CHAINS CRITICAL VALUE NOTED.  VALUE IS CONSISTENT WITH PREVIOUSLY REPORTED AND CALLED VALUE.    Culture   Final    GRAM POSITIVE COCCI TOO YOUNG TO READ Performed at [facility], [place] [address], [place], [place] [address]    Report Status PENDING  Incomplete  Blood culture (routine x 2)     Status:  None (Preliminary result)   Collection Time: 07/20/19  6:39 PM   Specimen: BLOOD  Result Value Ref Range Status   Specimen Description BLOOD RIGHT ANTECUBITAL  Final   Special Requests   Final    BOTTLES DRAWN AEROBIC AND ANAEROBIC Blood Culture results may not be optimal due to an inadequate volume of blood received in culture bottles   Culture  Setup Time   Final    GRAM POSITIVE COCCI IN CHAINS IN BOTH AEROBIC AND ANAEROBIC BOTTLES Organism ID to follow CRITICAL RESULT CALLED TO, READ  BACK BY AND VERIFIED WITHHughie Closs [id number] 07/21/19 A BROWNING Performed at [facility], [place] [address] [place], [place] [address]    Culture GRAM POSITIVE COCCI  Final   Report Status PENDING  Incomplete  Blood Culture ID Panel (Reflexed)     Status: Abnormal   Collection Time: 07/20/19  6:39 PM  Result Value Ref Range Status   Enterococcus species DETECTED (A) NOT DETECTED Final    Comment: CRITICAL RESULT CALLED TO, READ BACK BY AND VERIFIED WITH: M [place] [id number] 07/21/19 A BROWNING    Vancomycin resistance NOT DETECTED NOT DETECTED Final   Listeria monocytogenes NOT DETECTED NOT DETECTED Final   Staphylococcus species NOT DETECTED NOT DETECTED Final   Staphylococcus aureus (BCID) NOT DETECTED NOT DETECTED Final   Streptococcus species NOT DETECTED NOT DETECTED Final   Streptococcus agalactiae NOT DETECTED NOT DETECTED Final   Streptococcus pneumoniae NOT DETECTED NOT DETECTED Final   Streptococcus pyogenes NOT DETECTED NOT DETECTED Final   Acinetobacter baumannii NOT DETECTED NOT DETECTED Final   Enterobacteriaceae species NOT DETECTED NOT DETECTED Final   Enterobacter cloacae complex NOT DETECTED NOT DETECTED Final   Escherichia coli NOT DETECTED NOT DETECTED Final   Klebsiella oxytoca NOT DETECTED NOT DETECTED Final   Klebsiella pneumoniae NOT DETECTED NOT DETECTED Final   Proteus species NOT DETECTED NOT DETECTED Final   Serratia marcescens NOT DETECTED NOT DETECTED Final   Haemophilus influenzae NOT DETECTED NOT DETECTED Final   Neisseria meningitidis NOT DETECTED NOT DETECTED Final   Pseudomonas aeruginosa NOT DETECTED NOT DETECTED Final   Candida albicans NOT DETECTED NOT DETECTED Final   Candida glabrata NOT DETECTED NOT DETECTED Final   Candida krusei NOT DETECTED NOT DETECTED Final   Candida parapsilosis NOT DETECTED NOT DETECTED Final   Candida tropicalis NOT DETECTED NOT DETECTED Final    Comment: Performed at [facility], [place] [address], [place], [place] [address]    Impression/Plan:  1. Bacteremia - Positive Enterococcus.  Concern for AV vegetation on TTE.  Now both initial sets of blood cultures positive.  Will need a TEE  2. Medication monitoring - will continue to monitor WBC, creat.  WNL today.

## 2019-07-22 NOTE — Significant Event (Signed)
Rapid Response Event Note  Overview: Pt here with SMA thrombosis and splenic/L renal infarcts. Pt also has possible vegetation on aortic valve as well as positive blood cultures growing enterococcus.  He is on a heparin gtt as well as antibiotics with plans for a TEE. RRT called as a second set of eyes for pt with increasing abd pain radiating to chest/shoulders and arms. He has had this pain since before admission but, per pt, it has been 5/10 or less. Pt is now c/o 9/10 pain in abd/chest/shoulders/arms/hands. He also says, at times, that his fingers feel cold.   Initial Focused Assessment: Pt laying in bed, alert and oriented, in no distress. Pt says his pain has gotten worse t/o night and is almost unbearable. Pt's lungs are CTA, heart sounds WNL. ABD is soft-bowel sounds active.  Pt has good pulses in extremities. Extremities are warm to touch and cap refill <3 secs. Pt says his fingers do not feel cold at this time but they do intermittently. Pt received 1mg  morphine at 0056 for 8/10 pain. This helped his pain but it came back at a 9/10 at 0220 this morning.   T-98, HR-105, BP-156/77, RR-20, SpO2-99% on 2L [place].   Pt is pleasant despite his pain. He says he hasn't been able to do very much since he had COVID in Oct 2020 and is tired of being sick.  Interventions: EKG-ST Dilaudid 1mg  IV x1 ordered by NP Plan of Care (if not transferred):  Besides his increasing pain, assessment is benign at this time. He has reasons to be in pain, however, pain has seemed to increase t/o night. The dilaudid that was given has allowed him some relief>he said he had no pain within 5 minutes of administration. Pt encouraged to make his care team in the AM aware of his increased pain. Please continue to monitor pt closely. Call RRT if further assistance needed.  Event Summary:  Kennon Holter, NP notified PTA RRT Called: 0224 Arrived: 0232 Ended:0300 [place]

## 2019-07-22 NOTE — Progress Notes (Signed)
ANTICOAGULATION CONSULT NOTE  Pharmacy Consult for Heparin Indication: renal/splenic infarct  Patient Measurements: Height: 6' (182.9 cm) Weight: 75.4 kg (166 lb 3.2 oz) IBW/kg (Calculated) : 77.6 Heparin Dosing Weight: 73.5 kg  Vital Signs: Temp: 98 F (36.7 C) (04/17 0219) Temp Source: Oral (04/17 0219) BP: 143/73 (04/17 0251) Pulse Rate: 105 (04/17 0251)  Labs: Recent Labs    07/20/19 1448 07/20/19 1448 07/21/19 0236 07/21/19 0236 07/21/19 0934 07/21/19 1641 07/22/19 0230  HGB 9.0*   < > 7.7*   < >  --  8.0* 8.4*  HCT 30.8*   < > 26.1*  --   --  26.9* 28.7*  PLT 259   < > 180  --   --  174 191  HEPARINUNFRC  --   --  <0.10*   < > 0.22* 0.33 0.37  CREATININE 0.97  --  1.03  --   --   --  0.97   < > = values in this interval not displayed.    Estimated Creatinine Clearance: 83.1 mL/min (by C-G formula based on SCr of 0.97 mg/dL).  Assessment: 64 yr old male presented from PCP with renal/splenic infarct. Pharmacy was consulted to dose heparin.  Per H&P, the patient was noted to have a recent subacute small hemorrhagic infarct on MRI in January 2021. 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  Heparin level remains therapeutic; no bleeding reported.  Goal of Therapy:  Heparin level: 0.3-0.5 units/ml Monitor platelets by anticoagulation protocol: Yes   Plan:  Continue heparin gtt at 1600 units/hr Daily heparin level and CBC  Zonnique Norkus D. Mina Marble, PharmD, BCPS, [place] 07/22/2019, 3:23 AM

## 2019-07-22 NOTE — Progress Notes (Signed)
[facility]  Subjective  -states his abdomen feels okay this morning.  States he was having chest and upper extremity discomfort last night.   Objective (!) 104/54 83 (!) 97.5 F (36.4 C) (Oral) 20 100%  Intake/Output Summary (Last 24 hours) at 07/22/2019 1046 Last data filed at 07/22/2019 0816 Gross per 24 hour  Intake 2174.25 ml  Output 2750 ml  Net -575.75 ml    Abdomen soft nondistended no rebound or guarding  Laboratory Lab Results: Recent Labs    07/21/19 1641 07/22/19 0230  WBC 5.0 5.5  HGB 8.0* 8.4*  HCT 26.9* 28.7*  PLT 174 191   BMET Recent Labs    07/21/19 0236 07/22/19 0230  NA 132* 130*  K 4.8 5.3*  CL 98 96*  CO2 27 23  GLUCOSE 241* 459*  BUN 16 19  CREATININE 1.03 0.97  CALCIUM 8.3* 8.1*    COAG Lab Results  Component Value Date   INR 0.94 01/07/2015   No results found for: PTT  Assessment/Planning:  64 year old male the vascular surgery was consulted on for distal SMA thrombosis versus embolism yesterday.  Discussed again with the patient this morning that this is a very complicated situation.  He now has evidence of aortic valve vegetation on TTE as well as positive blood cultures for Enterococcus suggesting likely endocarditis with embolic event.  This SMA thrombosis versus embolus is very distal and would not be easily accessible from surgical or endovascular standpoint.  I am also worried that this is a chronic finding given has had abdominal pain off and on since December.  There does look to be some degeneration around this on CT - raises possibility of mycotic process. I agree with plan for GI consult and endoscopy to rule out underlying malignancy.  Continue heparin from our standpoint.  Will continue to follow.  Marty Heck 07/22/2019 10:46 AM --

## 2019-07-22 NOTE — Progress Notes (Signed)
Initial Nutrition Assessment  RD working remotely.  DOCUMENTATION CODES:   Not applicable  INTERVENTION:  Recommend liberalizing diet to carbohydrate modified.  Provide Ensure Max Protein po TID between meals, each supplement provides 150 kcal and 30 grams of protein. Patient prefers chocolate.  Encouraged adequate intake of calories and protein at meals.  NUTRITION DIAGNOSIS:   Inadequate oral intake related to decreased appetite, other (see comment)(abdominal pain) as evidenced by per patient/family report, 16.3% weight loss over 6 months.  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   Spoke with patient over the phone. He was very hesitant to provide history or agree to intervention since assessment was over the phone. Discussed with patient that RD coverage is remote on the weekends for the entire health system but that we work in-person during the week. He reports he has had a decreased appetite for months now since he had COVID. He attempts to eat 2-3 meals per day but can only eat small amounts at meals. He reports he mainly eats fruit and some other foods he can tolerate. He reports abdominal pain. He was drinking Glucerna at home but reports he does not want to drink Glucerna anymore as he feels it was still causing an increase in his blood sugar. Unable to get a detailed food history of typical daily intake due to patient's hesitancy to provide information over the phone so unable to estimate typical daily kcal intake. Patient amenable to trying chocolate Ensure Max Protein.  Patient reports  his UBW was 202 lbs and that he has lost approximately 40 lbs. According to chart patient was 90.1 kg on 01/25/2019. He is now 75.4 kg (166.2 lbs). He has lost 14.7 kg (16.3% body weight) over the past 6 months, which is significant for time frame.  Medications reviewed and include: Novolog 0-9 units TID, Lantus 15 units QHS, pantoprazole, ampicillin, ceftriaxone, heparin gtt.  Labs reviewed: CBG 255-420, Sodium 130, Potassium 5.3, Chloride 96.  RD suspects patient is malnourished. Unable to determine if he meets criteria for malnutrition at this time without more detailed nutrition history or NFPE.  NUTRITION - FOCUSED PHYSICAL EXAM:  Unable to complete at this time as RD is working remotely.  Diet Order:   Diet Order            Diet heart healthy/carb modified Room service appropriate? Yes; Fluid consistency: Thin  Diet effective now             EDUCATION NEEDS:   No education needs have been identified at this time  Skin:  Skin Assessment: Reviewed RN Assessment  Last BM:  07/19/2019 per chart  Height:   Ht Readings from Last 1 Encounters:  07/20/19 6' (1.829 m)   Weight:   Wt Readings from Last 1 Encounters:  07/20/19 75.4 kg   Ideal Body Weight:  80.9 kg  BMI:  Body mass index is 22.54 kg/m.  Estimated Nutritional Needs:   Kcal:  1900-2200  Protein:  100-110 grams  Fluid:  1.8-2 L/day  Jacklynn Barnacle, MS, RD, LDN Pager number available on Amion

## 2019-07-22 NOTE — Plan of Care (Signed)
  Problem: Clinical Measurements: Goal: Diagnostic test results will improve Outcome: Progressing   

## 2019-07-23 LAB — CBC
HCT: 29 % — ABNORMAL LOW (ref 39.0–52.0)
Hemoglobin: 8.4 g/dL — ABNORMAL LOW (ref 13.0–17.0)
MCH: 20.4 pg — ABNORMAL LOW (ref 26.0–34.0)
MCHC: 29 g/dL — ABNORMAL LOW (ref 30.0–36.0)
MCV: 70.4 fL — ABNORMAL LOW (ref 80.0–100.0)
Platelets: 253 10*3/uL (ref 150–400)
RBC: 4.12 MIL/uL — ABNORMAL LOW (ref 4.22–5.81)
RDW: 17.2 % — ABNORMAL HIGH (ref 11.5–15.5)
WBC: 4.6 10*3/uL (ref 4.0–10.5)
nRBC: 0 % (ref 0.0–0.2)

## 2019-07-23 LAB — HEPARIN LEVEL (UNFRACTIONATED): Heparin Unfractionated: 0.29 IU/mL — ABNORMAL LOW (ref 0.30–0.70)

## 2019-07-23 LAB — GLUCOSE, CAPILLARY
Glucose-Capillary: 265 mg/dL — ABNORMAL HIGH (ref 70–99)
Glucose-Capillary: 201 mg/dL — ABNORMAL HIGH (ref 70–99)

## 2019-07-23 MED ORDER — NALOXONE HCL 0.4 MG/ML IJ SOLN: 0.4000 mg | INTRAMUSCULAR | Status: DC | PRN

## 2019-07-23 MED ORDER — DIPHENHYDRAMINE HCL 12.5 MG/5ML PO ELIX: 12.5000 mg | ORAL_SOLUTION | Freq: Four times a day (QID) | ORAL | Status: DC | PRN

## 2019-07-23 MED ORDER — SODIUM CHLORIDE 0.9% FLUSH: 9.0000 mL | INTRAVENOUS | Status: DC | PRN

## 2019-07-23 MED ORDER — PEG 3350-KCL-NA BICARB-NACL 420 G PO SOLR
4000.0000 mL | Freq: Once | ORAL | Status: AC
  Administered 2019-07-23: 4000 mL via ORAL
  Filled 2019-07-23: qty 4000

## 2019-07-23 MED ORDER — ONDANSETRON HCL 4 MG/2ML IJ SOLN: 4.0000 mg | Freq: Four times a day (QID) | INTRAMUSCULAR | Status: DC | PRN

## 2019-07-23 MED ORDER — DIPHENHYDRAMINE HCL 50 MG/ML IJ SOLN: 12.5000 mg | Freq: Four times a day (QID) | INTRAMUSCULAR | Status: DC | PRN

## 2019-07-23 MED ORDER — INSULIN DETEMIR 100 UNIT/ML ~~LOC~~ SOLN
20.0000 [IU] | Freq: Two times a day (BID) | SUBCUTANEOUS | Status: DC
  Administered 2019-07-23 – 2019-07-25 (×5): 20 [IU] via SUBCUTANEOUS
  Filled 2019-07-23 (×6): qty 0.2

## 2019-07-23 MED ORDER — HYDROMORPHONE 1 MG/ML IV SOLN
INTRAVENOUS | Status: DC
  Administered 2019-07-23: 30 mg via INTRAVENOUS
  Administered 2019-07-23: 1.7 mg via INTRAVENOUS
  Administered 2019-07-24: 1 mg via INTRAVENOUS
  Administered 2019-07-24: 0.4 mg via INTRAVENOUS
  Administered 2019-07-24: 0 mg via INTRAVENOUS
  Administered 2019-07-25: 1.2 mL via INTRAVENOUS
  Administered 2019-07-25: 0.6 mL via INTRAVENOUS
  Administered 2019-07-25: 1.4 mg via INTRAVENOUS
  Administered 2019-07-25: 1.6 mg via INTRAVENOUS
  Filled 2019-07-23 (×4): qty 30

## 2019-07-23 NOTE — Plan of Care (Signed)
°  Problem: Coping: °Goal: Level of anxiety will decrease °Outcome: Progressing °  °

## 2019-07-23 NOTE — Progress Notes (Signed)
[facility]  Subjective  -had burning pain under his left subcostal margin.  States not hungry but when he does eat no significant abdominal pain.   Objective 139/68 93 97.6 F (36.4 C) (Oral) 19 97%  Intake/Output Summary (Last 24 hours) at 07/23/2019 1000 Last data filed at 07/23/2019 0700 Gross per 24 hour  Intake 1614 ml  Output 2245 ml  Net -631 ml    Abdomen soft, non-distended, no rebound or guarding  Laboratory Lab Results: Recent Labs    07/22/19 0230 07/23/19 0524  WBC 5.5 4.6  HGB 8.4* 8.4*  HCT 28.7* 29.0*  PLT 191 253   BMET Recent Labs    07/21/19 0236 07/22/19 0230  NA 132* 130*  K 4.8 5.3*  CL 98 96*  CO2 27 23  GLUCOSE 241* 459*  BUN 16 19  CREATININE 1.03 0.97  CALCIUM 8.3* 8.1*    COAG Lab Results  Component Value Date   INR 0.94 01/07/2015   No results found for: PTT  Assessment/Planning:  64 year old male the vascular surgery was consulted on for distal SMA thrombosis versus embolism.  Again this is a very complicated situation.  He now has evidence of aortic valve vegetation on TTE as well as positive blood cultures for Enterococcus suggesting likely endocarditis with embolic event.  Plan for TEE after cardiology consult.  This SMA thrombosis versus embolus is very distal and would not be easily accessible from surgical or endovascular standpoint.  I am also worried that this is a chronic finding given has had abdominal pain off and on since December.  There does look to be some degeneration around this on CT - raises possibility of mycotic process. No immediate plans for surgery from our standpoint at this time. Continue heparin from our standpoint.   Agree with GI input about possible endoscopy to rule out underlying malignancy.     Marty Heck 07/23/2019 10:00 AM --

## 2019-07-23 NOTE — Progress Notes (Signed)
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 Heparin drip was initiated.  4/18: Reports a burning pain wrapping around right abdomen and epigastric area. Has protonix, PRN morphine and oxy. Let's see if a GI cocktail will help as well. I have spoken with GI. They will evaluate him as well. TEE for tomorrow. Continue abx.    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     - continuing heparin     - will speak with GI      - 4/18: GI will eval, continue ampicillin/rocephin, TEE for tomorrow, continue heparin, Hgb is stable  Microcytic anemia     - iron deficient; s/p iron dosing     - Hgb is 8.4 this AM, follow     - 4/18: Hgb is stable, monitor.  Diabetes type 2. Poor control. Insulin-dependent.      - change lantus to levemir 15 units BID     - schedule 5 units novolog WM     - 4/18: Increase levemir to 20 units, follow.  Hypertension.     - BP ok, monitor  Chronic respiratory failure secondary to post covid ILD.      - Home oxygen  at 2      - continue 2L [place], he is stable  DVT prophylaxis: heparin Code Status: DNR   Status is: Inpatient  Remains inpatient appropriate because:IV treatments appropriate due to intensity of illness or inability to take PO   Dispo: The patient is from: Home              Anticipated d/c is to: Home              Anticipated d/c date is: > 3 days              Patient currently is not medically stable to d/c.  Consultants:   Vascular Surgery  ID  GI  Oncology  Cardiology  Antimicrobials:  . Rocephin . Ampicillin   ROS:  Reports rt abdominal and epigastric burning. Remainder 10-pt ROS is negative for all not previously mentioned.  Subjective: "It was burning awful."  Objective: Vitals:   07/22/19 2203 07/23/19 0627 07/23/19 0744 07/23/19 1000  BP: 103/62 129/64 139/68 122/71  Pulse: 80 90 93 76  Resp: 18 15 19 18   Temp: 97.6 F (36.4 C) 97.6 F (36.4 C)  98.2 F (36.8 C)  TempSrc: Oral Oral  Oral  SpO2: 96% 96% 97% 98%  Weight: 76.6 kg     Height:  Intake/Output Summary (Last 24 hours) at 07/23/2019 1048 Last data filed at 07/23/2019 0900 Gross per 24 hour  Intake 1734 ml  Output 2495 ml  Net -761 ml   Filed Weights   07/20/19 1405 07/20/19 2333 07/22/19 2203  Weight: 73.5 kg 75.4 kg 76.6 kg    Examination:  General: 64 y.o. male resting in bed in NAD Cardiovascular: RRR, +S1, S2, no m/g/r Respiratory: CTABL, no w/r/r, normal WOB GI: BS+, ND, slight RUQ/epigastric TTP, no masses noted, no organomegaly noted MSK: No e/c/c Neuro: alert to name, follows commands Psyc: Appropriate interaction and affect, calm/cooperative   Data Reviewed: I have personally reviewed following labs and imaging studies.  CBC: Recent Labs  Lab 07/20/19 1448 07/21/19 0236 07/21/19 1641 07/22/19 0230 07/23/19 0524  WBC 8.9 4.8 5.0 5.5 4.6  HGB 9.0* 7.7* 8.0* 8.4* 8.4*  HCT 30.8* 26.1* 26.9* 28.7* 29.0*  MCV 70.0* 69.6* 69.7* 70.7* 70.4*  PLT 259 180 174  191 [id number]   Basic Metabolic Panel: Recent Labs  Lab 07/20/19 1448 07/21/19 0236 07/22/19 0230  NA 132* 132* 130*  K 4.4 4.8 5.3*  CL 97* 98 96*  CO2 28 27 23   GLUCOSE 157* 241* 459*  BUN 16 16 19   CREATININE 0.97 1.03 0.97  CALCIUM 8.7* 8.3* 8.1*   GFR: Estimated Creatinine Clearance: 84.5 mL/min (by C-G formula based on SCr of 0.97 mg/dL). Liver Function Tests: Recent Labs  Lab 07/20/19 1448  AST 15  ALT 10  ALKPHOS 47  BILITOT 0.8  PROT 7.0  ALBUMIN 2.6*   No results for input(s): LIPASE, AMYLASE in the last 168 hours. No results for input(s): AMMONIA in the last 168 hours. Coagulation Profile: No results for input(s): INR, PROTIME in the last 168 hours. Cardiac Enzymes: No results for input(s): CKTOTAL, CKMB, CKMBINDEX, TROPONINI in the last 168 hours. BNP (last 3 results) No results for input(s): PROBNP in the last 8760 hours. HbA1C: Recent Labs    07/21/19 0236  HGBA1C 7.7*   CBG: Recent Labs  Lab 07/22/19 0648 07/22/19 0902 07/22/19 1724 07/22/19 2159 07/23/19 0655  GLUCAP 420* 453* 389* 329* 265*   Lipid Profile: No results for input(s): CHOL, HDL, LDLCALC, TRIG, CHOLHDL, LDLDIRECT in the last 72 hours. Thyroid Function Tests: No results for input(s): TSH, T4TOTAL, FREET4, T3FREE, THYROIDAB in the last 72 hours. Anemia Panel: Recent Labs    07/21/19 0236  VITAMINB12 420  FOLATE 25.9  FERRITIN 240  TIBC 196*  IRON 12*  RETICCTPCT 1.0   Sepsis Labs: No results for input(s): PROCALCITON, LATICACIDVEN in the last 168 hours.  Recent Results (from the past 240 hour(s))  SARS CORONAVIRUS 2 (TAT 6-24 HRS) Nasopharyngeal Nasopharyngeal Swab     Status: None   Collection Time: 07/20/19  5:49 PM   Specimen: Nasopharyngeal Swab  Result Value Ref Range Status   SARS Coronavirus 2 NEGATIVE NEGATIVE Final    Comment: (NOTE) SARS-CoV-2 target nucleic acids are NOT DETECTED. The SARS-CoV-2 RNA is generally detectable in upper and lower respiratory  specimens during the acute phase of infection. Negative results do not preclude SARS-CoV-2 infection, do not rule out co-infections with other pathogens, and should not be used as the sole basis for treatment or other patient management decisions. Negative results must be combined with clinical observations, patient history, and epidemiological information. The expected result is Negative. Fact Sheet for Patients: SugarRoll.be Fact Sheet for Healthcare Providers: https://www.woods-mathews.com/ This test is not yet approved or cleared by the [place] FDA  and  has been authorized for detection and/or diagnosis of SARS-CoV-2 by FDA under an Emergency Use Authorization (EUA). This EUA will remain  in effect (meaning this test can be used) for the duration of the COVID-19 declaration under Section 56 4(b)(1) of the Act, 21 U.S.C. section 360bbb-3(b)(1), unless the authorization is terminated or revoked sooner. Performed at [facility], [place] [address], [place], [place] [address]   Blood culture (routine x 2)     Status: None (Preliminary result)   Collection Time: 07/20/19  6:39 PM   Specimen: BLOOD  Result Value Ref Range Status   Specimen Description BLOOD LEFT ANTECUBITAL  Final   Special Requests   Final    BOTTLES DRAWN AEROBIC AND ANAEROBIC Blood Culture results may not be optimal due to an inadequate volume of blood received in culture bottles   Culture  Setup Time   Final    IN BOTH AEROBIC AND ANAEROBIC BOTTLES GRAM POSITIVE COCCI IN CHAINS CRITICAL VALUE NOTED.  VALUE IS CONSISTENT WITH PREVIOUSLY REPORTED AND CALLED VALUE.    Culture   Final    GRAM POSITIVE COCCI TOO YOUNG TO READ Performed at [facility], [place] [address], [place], [place] [address]    Report Status PENDING  Incomplete  Blood culture (routine x 2)     Status: Abnormal (Preliminary result)   Collection Time: 07/20/19  6:39 PM   Specimen: BLOOD    Result Value Ref Range Status   Specimen Description BLOOD RIGHT ANTECUBITAL  Final   Special Requests   Final    BOTTLES DRAWN AEROBIC AND ANAEROBIC Blood Culture results may not be optimal due to an inadequate volume of blood received in culture bottles   Culture  Setup Time   Final    GRAM POSITIVE COCCI IN CHAINS IN BOTH AEROBIC AND ANAEROBIC BOTTLES Organism ID to follow CRITICAL RESULT CALLED TO, READ BACK BY AND VERIFIED WITH: [name] [id number] 07/21/19 A BROWNING    Culture (A)  Final    ENTEROCOCCUS FAECALIS SUSCEPTIBILITIES PERFORMED ON PREVIOUS CULTURE WITHIN THE LAST 5 DAYS. Performed at [facility], [place] [address] [place], [place] [address]    Report Status PENDING  Incomplete   Organism ID, Bacteria ENTEROCOCCUS FAECALIS  Final      Susceptibility   Enterococcus faecalis - MIC*    AMPICILLIN <=2 SENSITIVE Sensitive     VANCOMYCIN 1 SENSITIVE Sensitive     * ENTEROCOCCUS FAECALIS  Blood Culture ID Panel (Reflexed)     Status: Abnormal   Collection Time: 07/20/19  6:39 PM  Result Value Ref Range Status   Enterococcus species DETECTED (A) NOT DETECTED Final    Comment: CRITICAL RESULT CALLED TO, READ BACK BY AND VERIFIED WITH: [name] [id number] 07/21/19 A BROWNING    Vancomycin resistance NOT DETECTED NOT DETECTED Final   Listeria monocytogenes NOT DETECTED NOT DETECTED Final   Staphylococcus species NOT DETECTED NOT DETECTED Final   Staphylococcus aureus (BCID) NOT DETECTED NOT DETECTED Final   Streptococcus species NOT DETECTED NOT DETECTED Final   Streptococcus agalactiae NOT DETECTED NOT DETECTED Final   Streptococcus pneumoniae NOT DETECTED NOT DETECTED Final   Streptococcus pyogenes NOT DETECTED NOT DETECTED Final   Acinetobacter baumannii NOT DETECTED NOT DETECTED Final   Enterobacteriaceae species NOT DETECTED NOT DETECTED Final   Enterobacter cloacae complex NOT DETECTED NOT DETECTED Final   Escherichia coli NOT DETECTED NOT DETECTED Final    Klebsiella oxytoca NOT DETECTED  NOT DETECTED Final   Klebsiella pneumoniae NOT DETECTED NOT DETECTED Final   Proteus species NOT DETECTED NOT DETECTED Final   Serratia marcescens NOT DETECTED NOT DETECTED Final   Haemophilus influenzae NOT DETECTED NOT DETECTED Final   Neisseria meningitidis NOT DETECTED NOT DETECTED Final   Pseudomonas aeruginosa NOT DETECTED NOT DETECTED Final   Candida albicans NOT DETECTED NOT DETECTED Final   Candida glabrata NOT DETECTED NOT DETECTED Final   Candida krusei NOT DETECTED NOT DETECTED Final   Candida parapsilosis NOT DETECTED NOT DETECTED Final   Candida tropicalis NOT DETECTED NOT DETECTED Final    Comment: Performed at [facility], [place] [address] [place], [place] [address]  Culture, blood (routine x 2)     Status: None (Preliminary result)   Collection Time: 07/22/19 10:27 AM   Specimen: BLOOD  Result Value Ref Range Status   Specimen Description BLOOD RIGHT ANTECUBITAL  Final   Special Requests   Final    BOTTLES DRAWN AEROBIC AND ANAEROBIC Blood Culture adequate volume   Culture   Final    NO GROWTH < 24 HOURS Performed at [facility], [place] [address], [place], [place] [address]    Report Status PENDING  Incomplete  Culture, blood (routine x 2)     Status: None (Preliminary result)   Collection Time: 07/22/19 10:33 AM   Specimen: BLOOD RIGHT HAND  Result Value Ref Range Status   Specimen Description BLOOD RIGHT HAND  Final   Special Requests   Final    BOTTLES DRAWN AEROBIC AND ANAEROBIC Blood Culture adequate volume   Culture   Final    NO GROWTH < 24 HOURS Performed at [facility], [place] [address] [place], [place] [address]    Report Status PENDING  Incomplete      Radiology Studies: No results found.   Scheduled Meds: . insulin aspart  0-9 Units Subcutaneous TID WC  . insulin aspart  5 Units Subcutaneous TID WC  . insulin detemir  20 Units Subcutaneous BID  . pantoprazole  40 mg Oral Daily  . Ensure Max  Protein  11 oz Oral TID BM  . sodium chloride flush  3 mL Intravenous Q12H   Continuous Infusions: . sodium chloride 1,000 mL (07/21/19 2147)  . ampicillin (OMNIPEN) IV 2 g (07/23/19 0750)  . cefTRIAXone (ROCEPHIN)  IV 2 g (07/23/19 0959)  . ferumoxytol 510 mg (07/22/19 1108)  . heparin 1,650 Units/hr (07/23/19 0832)     LOS: 2 days    Time spent: 35 minutes spent in the coordination of care today.    Jonnie Finner, DO [facility]  If 7PM-7AM, please contact night-coverage [email or web address] 07/23/2019, 10:48 AM

## 2019-07-23 NOTE — Progress Notes (Signed)
ANTICOAGULATION CONSULT NOTE  Pharmacy Consult for Heparin Indication: renal/splenic infarct  Patient Measurements: Height: 6' (182.9 cm) Weight: 76.6 kg (168 lb 14 oz) IBW/kg (Calculated) : 77.6 Heparin Dosing Weight: 73.5 kg  Vital Signs: Temp: 97.6 F (36.4 C) (04/18 0627) Temp Source: Oral (04/18 0627) BP: 129/64 (04/18 0627) Pulse Rate: 90 (04/18 0627)  Labs: Recent Labs    07/20/19 1448 07/20/19 1448 07/21/19 0236 07/21/19 0934 07/21/19 1641 07/21/19 1641 07/22/19 0230 07/23/19 0524  HGB 9.0*   < > 7.7*  --  8.0*   < > 8.4* 8.4*  HCT 30.8*   < > 26.1*  --  26.9*  --  28.7* 29.0*  PLT 259   < > 180  --  174  --  191 253  HEPARINUNFRC  --   --  <0.10*   < > 0.33  --  0.37 0.29*  CREATININE 0.97  --  1.03  --   --   --  0.97  --    < > = values in this interval not displayed.    Estimated Creatinine Clearance: 84.5 mL/min (by C-G formula based on SCr of 0.97 mg/dL).  Assessment: 64 yr old male presented from PCP with renal/splenic infarct. Pharmacy was consulted to dose heparin.  Per H&P, the patient was noted to have a recent subacute small hemorrhagic infarct on MRI in January 2021. 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  Heparin level slightly below goal on 1600 units/hr.  Work-up ongoing for bacteremia, r/o endocarditis.  Noted long-term plan for Eliquis when invasive testing complete.  Goal of Therapy:  Heparin level: 0.3-0.5 units/ml Monitor platelets by anticoagulation protocol: Yes   Plan:  Increase heparin gtt to 1650 units/hr Daily heparin level and CBC  Emmitte Surgeon, Pharm.D., BCPS Clinical Pharmacist Clinical phone for 07/23/2019 from 7:30-3:00 is [phone].  **Pharmacist phone directory can be found on [email or web address] listed under [place].  07/23/2019 7:44 AM

## 2019-07-23 NOTE — H&P (View-Only) (Signed)
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  He was being evaluated for abdominal pain in the LUQ by his PCP and pulmonologist.  These findings were identified with a CT angio of the abdomen.  Since his COVID illness he reports a 40 lbs weight loss.  The patient denies any issues with nausea, vomiting, diarrhea, hematochezia, or melena, but he does complain about constipation.  Per his report, he had a normal colonoscopy 5 years ago.  Work up in the hospital did show that his blood culture was positive for enterococcus faecalis.  Oncology was consulted and Dr. Marin Olp is concerned that he may have an underlying malignancy.  The patient's echocardiogram was suspicious for an aortic valve vegetation.  His blood work does demonstrate a progressive microcytic anemia.  His HGB prior to March this year was in the 13 g/dL range.  It dropped down to 10.7 g/dL on 06/19/2019 with an MCV of 66.4, which declined from 83.6.  During this hospitalization the nadir HGB was at 7.7 g/dL with an MCV of 69.6 on 07/21/2019.  His iron saturation is at 6%, but his ferritin is normal.  He is hemoccult negative.   Past Medical History:  Diagnosis Date  . Cancer ([place])    skin cancer face- basal  . Chronic respiratory failure ([place])    due to IDL from covid  . Complication of anesthesia   . COVID-19 01/2019  . Diabetes ([place])    type 2 x 5 yrs  . Hypertension    since age 51  . PONV (postoperative nausea and vomiting) 06/25/2017  . Stroke [facility])     Past Surgical History:  Procedure Laterality Date  . APPENDECTOMY    . CARDIAC CATHETERIZATION  N/A 01/07/2015   Procedure: Left Heart Cath and Coronary Angiography;  Surgeon: Burnell Blanks, MD;  Location: [facility];  Service: Cardiovascular;  Laterality: N/A;  . CERVICAL DISC SURGERY     with a plate   . CHOLECYSTECTOMY    . COLONOSCOPY N/A 03/15/2013   Procedure: COLONOSCOPY;  Surgeon: Rogene Houston, MD;  [facility];  Service: Endoscopy;  Laterality: N/A;  245  . cyst removal of neck    . KNEE ARTHROSCOPY Right   . LUMBAR DISC SURGERY  06/25/2017   lumbar 5  . LUMBAR LAMINECTOMY/DECOMPRESSION MICRODISCECTOMY N/A 07/30/2017   Procedure: MICRODISCECTOMY RIGHT LUMBAR FIVE- SACRAL ONE;  Surgeon: Erline Levine, MD;  Location: [place];  Service: Neurosurgery;  Laterality: N/A;  MICRODISCECTOMY RIGHT LUMBAR 5- SACRAL 1    History reviewed. No pertinent family history.  Social History:  reports that he quit smoking about 20 years ago. His smoking use included cigarettes. He started smoking about 50 years ago. He has a 64.00 pack-year smoking history. He has quit using smokeless tobacco.  His smokeless tobacco use included chew. He reports that he does not drink alcohol or use drugs.  Allergies:  Allergies  Allergen Reactions  . Invokana [Canagliflozin] Other (See Comments)    KETOACIDOSIS   . Percocet [Oxycodone-Acetaminophen] Itching  . Shellfish Allergy     Shrimp - possible Iodine allergy  UNSPECIFIED REACTION   . Sulfa Antibiotics     UNSPECIFIED REACTION     Medications:  Scheduled: . insulin aspart  0-9 Units Subcutaneous TID WC  . insulin aspart  5 Units Subcutaneous TID WC  . insulin detemir  20 Units Subcutaneous BID  . pantoprazole  40 mg Oral Daily  . Ensure Max Protein  11 oz Oral TID BM  . sodium chloride flush  3 mL Intravenous Q12H   Continuous: . sodium chloride 1,000 mL (07/21/19 2147)  . ampicillin (OMNIPEN) IV 2 g (07/23/19 0750)  . cefTRIAXone (ROCEPHIN)  IV 2 g (07/23/19 0959)  . ferumoxytol 510 mg (07/22/19 1108)  .  heparin 1,650 Units/hr (07/23/19 [id number])    Results for orders placed or performed during the hospital encounter of 07/20/19 (from the past 24 hour(s))  Occult blood card to lab, stool RN will collect     Status: None   Collection Time: 07/22/19  3:28 PM  Result Value Ref Range   Fecal Occult Bld NEGATIVE NEGATIVE  Glucose, capillary     Status: Abnormal   Collection Time: 07/22/19  5:24 PM  Result Value Ref Range   Glucose-Capillary 389 (H) 70 - 99 mg/dL  Glucose, capillary     Status: Abnormal   Collection Time: 07/22/19  9:59 PM  Result Value Ref Range   Glucose-Capillary 329 (H) 70 - 99 mg/dL  CBC     Status: Abnormal   Collection Time: 07/23/19  5:24 AM  Result Value Ref Range   WBC 4.6 4.0 - 10.5 K/uL   RBC 4.12 (L) 4.22 - 5.81 MIL/uL   Hemoglobin 8.4 (L) 13.0 - 17.0 g/dL   HCT 29.0 (L) 39.0 - 52.0 %   MCV 70.4 (L) 80.0 - 100.0 fL   MCH 20.4 (L) 26.0 - 34.0 pg   MCHC 29.0 (L) 30.0 - 36.0 g/dL   RDW 17.2 (H) 11.5 - 15.5 %   Platelets 253 150 - 400 K/uL   nRBC 0.0 0.0 - 0.2 %  Heparin level (unfractionated)     Status: Abnormal   Collection Time: 07/23/19  5:24 AM  Result Value Ref Range   Heparin Unfractionated 0.29 (L) 0.30 - 0.70 IU/mL  Glucose, capillary     Status: Abnormal   Collection Time: 07/23/19  6:55 AM  Result Value Ref Range   Glucose-Capillary 265 (H) 70 - 99 mg/dL     No results found.  ROS:  As stated above in the HPI otherwise negative.  Blood pressure 122/71, pulse 76, temperature 98.2 F (36.8 C), temperature source Oral, resp. rate 18, height 6' (1.829 m), weight 76.6 kg, SpO2 98 %.    PE: Gen: Uncomfortable with abdominal pain Lungs: CTA Bilaterally CV: RRR without M/G/R ABM: Soft, tender in the upper abdomen, +BS Ext: No C/C/E  Assessment/Plan: 1) Microcytic anemia. 2) 40 lbs weight loss. 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 4) Enterococcus faecalis bacteremia.   The patient will undergo further evaluation with an  enteroscopy/colonoscopy to further work up his medical issues.  Currently he is in severe upper abdominal pain.  It will be beneficial to start him on a PCA.  Plan: 1) Enteroscopy/colonoscopy with Dr. Lyndel Safe tomorrow. 2) Recommend PCA pump.  Wajiha Versteeg D 07/23/2019, 11:56 AM

## 2019-07-23 NOTE — Consult Note (Signed)
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  He was being evaluated for abdominal pain in the LUQ by his PCP and pulmonologist.  These findings were identified with a CT angio of the abdomen.  Since his COVID illness he reports a 40 lbs weight loss.  The patient denies any issues with nausea, vomiting, diarrhea, hematochezia, or melena, but he does complain about constipation.  Per his report, he had a normal colonoscopy 5 years ago.  Work up in the hospital did show that his blood culture was positive for enterococcus faecalis.  Oncology was consulted and Dr. Marin Olp is concerned that he may have an underlying malignancy.  The patient's echocardiogram was suspicious for an aortic valve vegetation.  His blood work does demonstrate a progressive microcytic anemia.  His HGB prior to March this year was in the 13 g/dL range.  It dropped down to 10.7 g/dL on 06/19/2019 with an MCV of 66.4, which declined from 83.6.  During this hospitalization the nadir HGB was at 7.7 g/dL with an MCV of 69.6 on 07/21/2019.  His iron saturation is at 6%, but his ferritin is normal.  He is hemoccult negative.   Past Medical History:  Diagnosis Date  . Cancer ([place])    skin cancer face- basal  . Chronic respiratory failure ([place])    due to IDL from covid  . Complication of anesthesia   . COVID-19 01/2019  . Diabetes ([place])    type 2 x 5 yrs  . Hypertension    since age 78  . PONV (postoperative nausea and vomiting) 06/25/2017  . Stroke [facility])     Past Surgical History:  Procedure Laterality Date  . APPENDECTOMY    . CARDIAC CATHETERIZATION  N/A 01/07/2015   Procedure: Left Heart Cath and Coronary Angiography;  Surgeon: Burnell Blanks, MD;  Location: [facility];  Service: Cardiovascular;  Laterality: N/A;  . CERVICAL DISC SURGERY     with a plate   . CHOLECYSTECTOMY    . COLONOSCOPY N/A 03/15/2013   Procedure: COLONOSCOPY;  Surgeon: Rogene Houston, MD;  [facility];  Service: Endoscopy;  Laterality: N/A;  245  . cyst removal of neck    . KNEE ARTHROSCOPY Right   . LUMBAR DISC SURGERY  06/25/2017   lumbar 5  . LUMBAR LAMINECTOMY/DECOMPRESSION MICRODISCECTOMY N/A 07/30/2017   Procedure: MICRODISCECTOMY RIGHT LUMBAR FIVE- SACRAL ONE;  Surgeon: Erline Levine, MD;  Location: [facility];  Service: Neurosurgery;  Laterality: N/A;  MICRODISCECTOMY RIGHT LUMBAR 5- SACRAL 1    History reviewed. No pertinent family history.  Social History:  reports that he quit smoking about 20 years ago. His smoking use included cigarettes. He started smoking about 50 years ago. He has a 64.00 pack-year smoking history. He has quit using smokeless tobacco.  His smokeless tobacco use included chew. He reports that he does not drink alcohol or use drugs.  Allergies:  Allergies  Allergen Reactions  . Invokana [Canagliflozin] Other (See Comments)    KETOACIDOSIS   . Percocet [Oxycodone-Acetaminophen] Itching  . Shellfish Allergy     Shrimp - possible Iodine allergy  UNSPECIFIED REACTION   . Sulfa Antibiotics     UNSPECIFIED REACTION     Medications:  Scheduled: . insulin aspart  0-9 Units Subcutaneous TID WC  . insulin aspart  5 Units Subcutaneous TID WC  . insulin detemir  20 Units Subcutaneous BID  . pantoprazole  40 mg Oral Daily  . Ensure Max Protein  11 oz Oral TID BM  . sodium chloride flush  3 mL Intravenous Q12H   Continuous: . sodium chloride 1,000 mL (07/21/19 2147)  . ampicillin (OMNIPEN) IV 2 g (07/23/19 0750)  . cefTRIAXone (ROCEPHIN)  IV 2 g (07/23/19 0959)  . ferumoxytol 510 mg (07/22/19 1108)  .  heparin 1,650 Units/hr (07/23/19 [id number])    Results for orders placed or performed during the hospital encounter of 07/20/19 (from the past 24 hour(s))  Occult blood card to lab, stool RN will collect     Status: None   Collection Time: 07/22/19  3:28 PM  Result Value Ref Range   Fecal Occult Bld NEGATIVE NEGATIVE  Glucose, capillary     Status: Abnormal   Collection Time: 07/22/19  5:24 PM  Result Value Ref Range   Glucose-Capillary 389 (H) 70 - 99 mg/dL  Glucose, capillary     Status: Abnormal   Collection Time: 07/22/19  9:59 PM  Result Value Ref Range   Glucose-Capillary 329 (H) 70 - 99 mg/dL  CBC     Status: Abnormal   Collection Time: 07/23/19  5:24 AM  Result Value Ref Range   WBC 4.6 4.0 - 10.5 K/uL   RBC 4.12 (L) 4.22 - 5.81 MIL/uL   Hemoglobin 8.4 (L) 13.0 - 17.0 g/dL   HCT 29.0 (L) 39.0 - 52.0 %   MCV 70.4 (L) 80.0 - 100.0 fL   MCH 20.4 (L) 26.0 - 34.0 pg   MCHC 29.0 (L) 30.0 - 36.0 g/dL   RDW 17.2 (H) 11.5 - 15.5 %   Platelets 253 150 - 400 K/uL   nRBC 0.0 0.0 - 0.2 %  Heparin level (unfractionated)     Status: Abnormal   Collection Time: 07/23/19  5:24 AM  Result Value Ref Range   Heparin Unfractionated 0.29 (L) 0.30 - 0.70 IU/mL  Glucose, capillary     Status: Abnormal   Collection Time: 07/23/19  6:55 AM  Result Value Ref Range   Glucose-Capillary 265 (H) 70 - 99 mg/dL     No results found.  ROS:  As stated above in the HPI otherwise negative.  Blood pressure 122/71, pulse 76, temperature 98.2 F (36.8 C), temperature source Oral, resp. rate 18, height 6' (1.829 m), weight 76.6 kg, SpO2 98 %.    PE: Gen: Uncomfortable with abdominal pain Lungs: CTA Bilaterally CV: RRR without M/G/R ABM: Soft, tender in the upper abdomen, +BS Ext: No C/C/E  Assessment/Plan: 1) Microcytic anemia. 2) 40 lbs weight loss. 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 4) Enterococcus faecalis bacteremia.   The patient will undergo further evaluation with an  enteroscopy/colonoscopy to further work up his medical issues.  Currently he is in severe upper abdominal pain.  It will be beneficial to start him on a PCA.  Plan: 1) Enteroscopy/colonoscopy with Dr. Lyndel Safe tomorrow. 2) Recommend PCA pump.  Jaedan Huttner D 07/23/2019, 11:56 AM

## 2019-07-23 NOTE — Plan of Care (Signed)
  Problem: Clinical Measurements: Goal: Diagnostic test results will improve Outcome: Progressing   

## 2019-07-24 ENCOUNTER — Encounter (HOSPITAL_COMMUNITY)
Admission: EM | Disposition: A | Discharge status: Hospice | Payer: Self-pay | Source: Home / Self Care | Attending: Internal Medicine

## 2019-07-24 ENCOUNTER — Other Ambulatory Visit (HOSPITAL_COMMUNITY): Payer: Commercial Managed Care - PPO

## 2019-07-24 ENCOUNTER — Encounter (HOSPITAL_COMMUNITY): Payer: Self-pay | Admitting: Internal Medicine

## 2019-07-24 DIAGNOSIS — R7881 Bacteremia: Secondary | ICD-10-CM | POA: Diagnosis present

## 2019-07-24 DIAGNOSIS — I358 Other nonrheumatic aortic valve disorders: Secondary | ICD-10-CM | POA: Insufficient documentation

## 2019-07-24 DIAGNOSIS — R0989 Other specified symptoms and signs involving the circulatory and respiratory systems: Secondary | ICD-10-CM

## 2019-07-24 DIAGNOSIS — B952 Enterococcus as the cause of diseases classified elsewhere: Secondary | ICD-10-CM | POA: Diagnosis present

## 2019-07-24 DIAGNOSIS — I330 Acute and subacute infective endocarditis: Secondary | ICD-10-CM

## 2019-07-24 LAB — CBC
HCT: 28.2 % — ABNORMAL LOW (ref 39.0–52.0)
Hemoglobin: 8.2 g/dL — ABNORMAL LOW (ref 13.0–17.0)
MCH: 20.5 pg — ABNORMAL LOW (ref 26.0–34.0)
MCHC: 29.1 g/dL — ABNORMAL LOW (ref 30.0–36.0)
MCV: 70.5 fL — ABNORMAL LOW (ref 80.0–100.0)
Platelets: 255 10*3/uL (ref 150–400)
RBC: 4 MIL/uL — ABNORMAL LOW (ref 4.22–5.81)
RDW: 17.3 % — ABNORMAL HIGH (ref 11.5–15.5)
WBC: 6.8 10*3/uL (ref 4.0–10.5)
nRBC: 0 % (ref 0.0–0.2)

## 2019-07-24 LAB — GLUCOSE, CAPILLARY
Glucose-Capillary: 345 mg/dL — ABNORMAL HIGH (ref 70–99)
Glucose-Capillary: 402 mg/dL — ABNORMAL HIGH (ref 70–99)
Glucose-Capillary: 359 mg/dL — ABNORMAL HIGH (ref 70–99)
Glucose-Capillary: 211 mg/dL — ABNORMAL HIGH (ref 70–99)
Glucose-Capillary: 168 mg/dL — ABNORMAL HIGH (ref 70–99)
Glucose-Capillary: 166 mg/dL — ABNORMAL HIGH (ref 70–99)
Glucose-Capillary: 135 mg/dL — ABNORMAL HIGH (ref 70–99)
Glucose-Capillary: 83 mg/dL (ref 70–99)
Glucose-Capillary: 147 mg/dL — ABNORMAL HIGH (ref 70–99)

## 2019-07-24 LAB — HEPARIN LEVEL (UNFRACTIONATED)
Heparin Unfractionated: 0.24 IU/mL — ABNORMAL LOW (ref 0.30–0.70)
Heparin Unfractionated: 0.26 IU/mL — ABNORMAL LOW (ref 0.30–0.70)

## 2019-07-24 SURGERY — ECHOCARDIOGRAM, TRANSESOPHAGEAL
Anesthesia: Monitor Anesthesia Care

## 2019-07-24 NOTE — Progress Notes (Signed)
Due to heparin drip not being stopped six hours prior to scheduled procedures, they have been moved to tomorrow (4/20). Lyndel Safe, MD notified and gave verbal order for clear liquid diet until midnight, then he will resume NPO status. Patient updated of this information.

## 2019-07-24 NOTE — Progress Notes (Signed)
Checked in on pt this am and he is sleeping soundly and I did not wake him.  He is scheduled for endoscopy today.  Will await results.  Nothing from vascular at this point as workup continues.  Will continue to follow.  Leontine Locket, [facility] 07/24/2019 7:28 AM

## 2019-07-24 NOTE — Progress Notes (Signed)
ANTICOAGULATION CONSULT NOTE  Pharmacy Consult for Heparin Indication: renal/splenic infarct  Patient Measurements: Height: 6' (182.9 cm) Weight: 79 kg (174 lb 2.6 oz) IBW/kg (Calculated) : 77.6 Heparin Dosing Weight: 73.5 kg  Vital Signs: Temp: 98 F (36.7 C) (04/19 0931) Temp Source: Oral (04/19 0931) BP: 143/69 (04/19 0931) Pulse Rate: 95 (04/19 0931)  Labs: Recent Labs    07/22/19 0230 07/22/19 0230 07/23/19 0524 07/24/19 0533 07/24/19 1250  HGB 8.4*   < > 8.4* 8.2*  --   HCT 28.7*  --  29.0* 28.2*  --   PLT 191  --  253 255  --   HEPARINUNFRC 0.37   < > 0.29* 0.24* 0.26*  CREATININE 0.97  --   --   --   --    < > = values in this interval not displayed.    Estimated Creatinine Clearance: 85.6 mL/min (by C-G formula based on SCr of 0.97 mg/dL).  Assessment: 64 yr old male presented from PCP with renal/splenic infarct. Pharmacy was consulted to dose heparin.  Per H&P, the patient was noted to have a recent subacute small hemorrhagic infarct on MRI in January 2021. 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  Heparin level remains below goal on 1750 units/hr.  Work-up ongoing for bacteremia, r/o endocarditis.  Noted long-term plan for Eliquis when invasive testing complete.  Plan to stop heparin 0200 4/20 in anticipation of EGD/colonoscopy scheduled for 0800.  Goal of Therapy:  Heparin level: 0.3-0.5 units/ml Monitor platelets by anticoagulation protocol: Yes   Plan:  Increase heparin gtt to 1900 units/hr Stop heparin at 0200 4/20. Follow-up after GI procedure for plans on resuming anticoagulation  Eliseo Withers, Pharm.D., BCPS Clinical Pharmacist Clinical phone for 07/24/2019 from 7:30-3:00 is [phone].  **Pharmacist phone directory can be found on [email or web address] listed under [place].  07/24/2019 1:42 PM

## 2019-07-24 NOTE — Progress Notes (Signed)
ANTICOAGULATION CONSULT NOTE  Pharmacy Consult for Heparin Indication: renal/splenic infarct  Patient Measurements: Height: 6' (182.9 cm) Weight: 79 kg (174 lb 2.6 oz) IBW/kg (Calculated) : 77.6 Heparin Dosing Weight: 73.5 kg  Vital Signs: Temp: 98.2 F (36.8 C) (04/19 0613) Temp Source: Oral (04/19 0613) BP: 130/66 (04/19 [id number]) Pulse Rate: 93 (04/19 0613)  Labs: Recent Labs    07/22/19 0230 07/22/19 0230 07/23/19 0524 07/24/19 0533  HGB 8.4*   < > 8.4* 8.2*  HCT 28.7*  --  29.0* 28.2*  PLT 191  --  253 255  HEPARINUNFRC 0.37  --  0.29* 0.24*  CREATININE 0.97  --   --   --    < > = values in this interval not displayed.    Estimated Creatinine Clearance: 85.6 mL/min (by C-G formula based on SCr of 0.97 mg/dL).  Assessment: 64 yr old male presented from PCP with renal/splenic infarct. Pharmacy was consulted to dose heparin.  Per H&P, the patient was noted to have a recent subacute small hemorrhagic infarct on MRI in January 2021. 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  Heparin level remains below goal on 1650 units/hr.  No issues with line or bleeding reported per RN.  Goal of Therapy:  Heparin level: 0.3-0.5 units/ml Monitor platelets by anticoagulation protocol: Yes   Plan:  Increase heparin gtt to 1750 units/hr Will f/u 6 hr heparin level  Sherlon Handing, PharmD, BCPS Please see amion for complete clinical pharmacist phone list 07/24/2019 7:11 AM

## 2019-07-24 NOTE — Progress Notes (Signed)
Progress Note    Nathan Perkins  [id number] DOB: 09-12-1955  DOA: 07/20/2019 PCP: Monico Blitz, MD    Brief Narrative:   Medical records reviewed and are as summarized below:  Nathan Perkins is an 64 y.o. 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  Heparin drip was initiated.  Further work-up reveals a bacteremia and concern for enterococcal endocarditis.  Evaluated by gastroenterology, vascular surgery.  Antibiotics initiated. Scheduled for enteroscopy/colonoscopy for further work up.   Assessment/Plan:   Principal Problem:   Suspected endocarditis Active Problems:   Superior mesenteric artery thrombosis (HCC)   Hypertension associated with diabetes ([place])   Microcytic anemia   Bacteremia due to Enterococcus   ILD (interstitial lung disease) ([place])   Diabetes ([place])   Chronic respiratory failure ([place])  #1.  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  Echo concerning for possible vegetation on AV.  ampicillian and Rocephin initiated.  Patient is afebrile nontoxic-appearing but acutely ill.  Continues with abdominal pain.  Reluctant to use PCA -Colonoscopy today -TEE -Continue heparin -Monitor hemoglobin  #2.  Anemia.  IDA.  Status post iron. hemoglobin stable. -Monitor  #3.  Diabetes type 2.  Poor control.  Home medications include long-acting insulin as well as SS -Levemir increased yesterday with some improvement -No coverage.  #4.  Hypertension.  Fair control.  Home medications include amlodipine, lisinopril.  Home medications on hold for now -Monitor blood pressure -Resume home meds as indicated  #5.  Chronic respiratory failure  secondary to post Covid interstitial lung disease.  Patient is on home oxygen at 2 L.  Oxygen saturation level greater than 90% on 2 L.  Stable -Monitor oxygen saturation level -Continue supplemental oxygen -  Family Communication/Anticipated D/C date and plan/Code Status   DVT prophylaxis: heparin ordered. Code Status: DNRl Code.  Family Communication: patient with no questions Disposition Plan: Status is: Inpatient  Remains inpatient appropriate because:Ongoing diagnostic testing needed not appropriate for outpatient work up   Dispo: The patient is from: Home              Anticipated d/c is to: Home              Anticipated d/c date is: 2 days              Patient currently is not medically stable to d/c.          Medical Consultants:    Alisa Graff vascular  [facility] oncology    Anti-Infectives:    rocephen   ampicillin  Subjective:   Patient complains continued abdominal pain and reluctant to use PCA  Objective:    Vitals:   07/24/19 0005 07/24/19 0409 07/24/19 0613 07/24/19 0931  BP:   130/66 (!) 143/69  Pulse:   93 95  Resp: 19 20 16 18   Temp:   98.2 F (36.8 C) 98 F (36.7 C)  TempSrc:   Oral Oral  SpO2: 97% 99% 98% 97%  Weight:      Height:        Intake/Output Summary (Last 24 hours) at 07/24/2019 1012 Last data filed at 07/24/2019 0600 Gross per 24 hour  Intake 3070 ml  Output 1475 ml  Net 1595  ml   Filed Weights   07/20/19 2333 07/22/19 2203 07/23/19 2125  Weight: 75.4 kg 76.6 kg 79 kg    Exam: General: Awake somewhat pale acutely ill but in no acute distress CV: Regular rate and rhythm no murmur gallop or rub no lower extremity edema Respiratory: No increased work of breathing breath sounds are distant but clear hear no crackles no wheezes Abdomen: Soft nondistended sluggish bowel sounds diffuse tenderness to palpation no guarding or rebounding Musculoskeletal: Joints without swelling/erythema  Data Reviewed:   I have  personally reviewed following labs and imaging studies:  Labs: Labs show the following:   Basic Metabolic Panel: Recent Labs  Lab 07/20/19 1448 07/20/19 1448 07/21/19 0236 07/22/19 0230  NA 132*  --  132* 130*  K 4.4   < > 4.8 5.3*  CL 97*  --  98 96*  CO2 28  --  27 23  GLUCOSE 157*  --  241* 459*  BUN 16  --  16 19  CREATININE 0.97  --  1.03 0.97  CALCIUM 8.7*  --  8.3* 8.1*   < > = values in this interval not displayed.   GFR Estimated Creatinine Clearance: 85.6 mL/min (by C-G formula based on SCr of 0.97 mg/dL). Liver Function Tests: Recent Labs  Lab 07/20/19 1448  AST 15  ALT 10  ALKPHOS 47  BILITOT 0.8  PROT 7.0  ALBUMIN 2.6*   No results for input(s): LIPASE, AMYLASE in the last 168 hours. No results for input(s): AMMONIA in the last 168 hours. Coagulation profile No results for input(s): INR, PROTIME in the last 168 hours.  CBC: Recent Labs  Lab 07/21/19 0236 07/21/19 1641 07/22/19 0230 07/23/19 0524 07/24/19 0533  WBC 4.8 5.0 5.5 4.6 6.8  HGB 7.7* 8.0* 8.4* 8.4* 8.2*  HCT 26.1* 26.9* 28.7* 29.0* 28.2*  MCV 69.6* 69.7* 70.7* 70.4* 70.5*  PLT 180 174 191 253 255   Cardiac Enzymes: No results for input(s): CKTOTAL, CKMB, CKMBINDEX, TROPONINI in the last 168 hours. BNP (last 3 results) No results for input(s): PROBNP in the last 8760 hours. CBG: Recent Labs  Lab 07/22/19 1724 07/22/19 2159 07/23/19 0655 07/23/19 2123 07/24/19 0649  GLUCAP 389* 329* 265* 201* 166*   D-Dimer: No results for input(s): DDIMER in the last 72 hours. Hgb A1c: No results for input(s): HGBA1C in the last 72 hours. Lipid Profile: No results for input(s): CHOL, HDL, LDLCALC, TRIG, CHOLHDL, LDLDIRECT in the last 72 hours. Thyroid function studies: No results for input(s): TSH, T4TOTAL, T3FREE, THYROIDAB in the last 72 hours.  Invalid input(s): FREET3 Anemia work up: No results for input(s): VITAMINB12, FOLATE, FERRITIN, TIBC, IRON, RETICCTPCT in the last 72  hours. Sepsis Labs: Recent Labs  Lab 07/21/19 1641 07/22/19 0230 07/23/19 0524 07/24/19 0533  WBC 5.0 5.5 4.6 6.8    Microbiology Recent Results (from the past 240 hour(s))  SARS CORONAVIRUS 2 (TAT 6-24 HRS) Nasopharyngeal Nasopharyngeal Swab     Status: None   Collection Time: 07/20/19  5:49 PM   Specimen: Nasopharyngeal Swab  Result Value Ref Range Status   SARS Coronavirus 2 NEGATIVE NEGATIVE Final    Comment: (NOTE) SARS-CoV-2 target nucleic acids are NOT DETECTED. The SARS-CoV-2 RNA is generally detectable in upper and lower respiratory specimens during the acute phase of infection. Negative results do not preclude SARS-CoV-2 infection, do not rule out co-infections with other pathogens, and should not be used as the sole basis for treatment or other patient management decisions. Negative  results must be combined with clinical observations, patient history, and epidemiological information. The expected result is Negative. Fact Sheet for Patients: SugarRoll.be Fact Sheet for Healthcare Providers: https://www.woods-mathews.com/ This test is not yet approved or cleared by the [place] FDA and  has been authorized for detection and/or diagnosis of SARS-CoV-2 by FDA under an Emergency Use Authorization (EUA). This EUA will remain  in effect (meaning this test can be used) for the duration of the COVID-19 declaration under Section 56 4(b)(1) of the Act, 21 U.S.C. section 360bbb-3(b)(1), unless the authorization is terminated or revoked sooner. Performed at [facility], [place] [address], [place], [place] [address]   Blood culture (routine x 2)     Status: None (Preliminary result)   Collection Time: 07/20/19  6:39 PM   Specimen: BLOOD  Result Value Ref Range Status   Specimen Description BLOOD LEFT ANTECUBITAL  Final   Special Requests   Final    BOTTLES DRAWN AEROBIC AND ANAEROBIC Blood Culture results may not be optimal  due to an inadequate volume of blood received in culture bottles   Culture  Setup Time   Final    IN BOTH AEROBIC AND ANAEROBIC BOTTLES GRAM POSITIVE COCCI IN CHAINS CRITICAL VALUE NOTED.  VALUE IS CONSISTENT WITH PREVIOUSLY REPORTED AND CALLED VALUE.    Culture   Final    GRAM POSITIVE COCCI TOO YOUNG TO READ Performed at [facility], [place] [address] [place], [place] [address]    Report Status PENDING  Incomplete  Blood culture (routine x 2)     Status: Abnormal (Preliminary result)   Collection Time: 07/20/19  6:39 PM   Specimen: BLOOD  Result Value Ref Range Status   Specimen Description BLOOD RIGHT ANTECUBITAL  Final   Special Requests   Final    BOTTLES DRAWN AEROBIC AND ANAEROBIC Blood Culture results may not be optimal due to an inadequate volume of blood received in culture bottles   Culture  Setup Time   Final    GRAM POSITIVE COCCI IN CHAINS IN BOTH AEROBIC AND ANAEROBIC BOTTLES Organism ID to follow CRITICAL RESULT CALLED TO, READ BACK BY AND VERIFIED WITH: [name] [id number] 07/21/19 A BROWNING    Culture (A)  Final    ENTEROCOCCUS FAECALIS SUSCEPTIBILITIES PERFORMED ON PREVIOUS CULTURE WITHIN THE LAST 5 DAYS. Performed at [facility], [place] [address], [place], [place] [address]    Report Status PENDING  Incomplete   Organism ID, Bacteria ENTEROCOCCUS FAECALIS  Final      Susceptibility   Enterococcus faecalis - MIC*    AMPICILLIN <=2 SENSITIVE Sensitive     VANCOMYCIN 1 SENSITIVE Sensitive     * ENTEROCOCCUS FAECALIS  Blood Culture ID Panel (Reflexed)     Status: Abnormal   Collection Time: 07/20/19  6:39 PM  Result Value Ref Range Status   Enterococcus species DETECTED (A) NOT DETECTED Final    Comment: CRITICAL RESULT CALLED TO, READ BACK BY AND VERIFIED WITH: [name] PHARMD [id number] 07/21/19 A BROWNING    Vancomycin resistance NOT DETECTED NOT DETECTED Final   Listeria monocytogenes NOT DETECTED NOT DETECTED Final   Staphylococcus species NOT  DETECTED NOT DETECTED Final   Staphylococcus aureus (BCID) NOT DETECTED NOT DETECTED Final   Streptococcus species NOT DETECTED NOT DETECTED Final   Streptococcus agalactiae NOT DETECTED NOT DETECTED Final   Streptococcus pneumoniae NOT DETECTED NOT DETECTED Final   Streptococcus pyogenes NOT DETECTED NOT DETECTED Final   Acinetobacter  baumannii NOT DETECTED NOT DETECTED Final   Enterobacteriaceae species NOT DETECTED NOT DETECTED Final   Enterobacter cloacae complex NOT DETECTED NOT DETECTED Final   Escherichia coli NOT DETECTED NOT DETECTED Final   Klebsiella oxytoca NOT DETECTED NOT DETECTED Final   Klebsiella pneumoniae NOT DETECTED NOT DETECTED Final   Proteus species NOT DETECTED NOT DETECTED Final   Serratia marcescens NOT DETECTED NOT DETECTED Final   Haemophilus influenzae NOT DETECTED NOT DETECTED Final   Neisseria meningitidis NOT DETECTED NOT DETECTED Final   Pseudomonas aeruginosa NOT DETECTED NOT DETECTED Final   Candida albicans NOT DETECTED NOT DETECTED Final   Candida glabrata NOT DETECTED NOT DETECTED Final   Candida krusei NOT DETECTED NOT DETECTED Final   Candida parapsilosis NOT DETECTED NOT DETECTED Final   Candida tropicalis NOT DETECTED NOT DETECTED Final    Comment: Performed at [facility], [place] [address], [place], [place] [address]  Culture, blood (routine x 2)     Status: None (Preliminary result)   Collection Time: 07/22/19 10:27 AM   Specimen: BLOOD  Result Value Ref Range Status   Specimen Description BLOOD RIGHT ANTECUBITAL  Final   Special Requests   Final    BOTTLES DRAWN AEROBIC AND ANAEROBIC Blood Culture adequate volume   Culture   Final    NO GROWTH 1 DAY Performed at [facility], [place] [address], [place], [place] [address]    Report Status PENDING  Incomplete  Culture, blood (routine x 2)     Status: None (Preliminary result)   Collection Time: 07/22/19 10:33 AM   Specimen: BLOOD RIGHT HAND  Result Value Ref Range Status    Specimen Description BLOOD RIGHT HAND  Final   Special Requests   Final    BOTTLES DRAWN AEROBIC AND ANAEROBIC Blood Culture adequate volume   Culture   Final    NO GROWTH 1 DAY Performed at [facility], [place] [address] [place], [place] [address]    Report Status PENDING  Incomplete    Procedures and diagnostic studies:  No results found.  Medications:   . HYDROmorphone   Intravenous Q4H  . insulin aspart  0-9 Units Subcutaneous TID WC  . insulin aspart  5 Units Subcutaneous TID WC  . insulin detemir  20 Units Subcutaneous BID  . pantoprazole  40 mg Oral Daily  . Ensure Max Protein  11 oz Oral TID BM  . sodium chloride flush  3 mL Intravenous Q12H   Continuous Infusions: . sodium chloride 1,000 mL (07/21/19 2147)  . ampicillin (OMNIPEN) IV 2 g (07/24/19 0408)  . cefTRIAXone (ROCEPHIN)  IV 2 g (07/23/19 2138)  . ferumoxytol 510 mg (07/22/19 1108)  . heparin 1,750 Units/hr (07/24/19 0717)     LOS: 3 days   Radene Gunning NP  [facility]   How to contact the [facility] Attending or Consulting provider [place] or covering provider during after hours [place], for this patient?  1. Check the care team in [facility] and look for a) attending/consulting [facility] provider listed and b) the [facility] team listed 2. Log into [email or web address] and use [place]'s universal password to access. If you do not have the password, please contact the hospital operator. 3. Locate the [facility] provider you are looking for under [facility] and page to a number that you can be directly reached. 4. If you still have difficulty reaching the provider, please page the [facility] (Director on Call) for the Hospitalists listed  on amion for assistance.  07/24/2019, 10:12 AM

## 2019-07-24 NOTE — Plan of Care (Signed)
  Problem: Pain Managment: Goal: General experience of comfort will improve Outcome: Progressing   Problem: Coping: Goal: Level of anxiety will decrease Outcome: Progressing   Problem: Safety: Goal: Ability to remain free from injury will improve Outcome: Progressing   

## 2019-07-25 ENCOUNTER — Inpatient Hospital Stay (HOSPITAL_COMMUNITY): Payer: Commercial Managed Care - PPO

## 2019-07-25 ENCOUNTER — Encounter (HOSPITAL_COMMUNITY)
Admission: EM | Disposition: A | Discharge status: Hospice | Payer: Self-pay | Source: Home / Self Care | Attending: Internal Medicine

## 2019-07-25 ENCOUNTER — Encounter (HOSPITAL_COMMUNITY): Payer: Self-pay | Admitting: Internal Medicine

## 2019-07-25 ENCOUNTER — Inpatient Hospital Stay (HOSPITAL_COMMUNITY): Payer: Commercial Managed Care - PPO | Admitting: Certified Registered"

## 2019-07-25 DIAGNOSIS — I339 Acute and subacute endocarditis, unspecified: Secondary | ICD-10-CM

## 2019-07-25 DIAGNOSIS — K55069 Acute infarction of intestine, part and extent unspecified: Secondary | ICD-10-CM

## 2019-07-25 DIAGNOSIS — I351 Nonrheumatic aortic (valve) insufficiency: Secondary | ICD-10-CM

## 2019-07-25 DIAGNOSIS — D735 Infarction of spleen: Secondary | ICD-10-CM | POA: Diagnosis present

## 2019-07-25 DIAGNOSIS — K317 Polyp of stomach and duodenum: Secondary | ICD-10-CM

## 2019-07-25 DIAGNOSIS — N280 Ischemia and infarction of kidney: Principal | ICD-10-CM | POA: Diagnosis present

## 2019-07-25 DIAGNOSIS — Z515 Encounter for palliative care: Secondary | ICD-10-CM

## 2019-07-25 HISTORY — PX: ENTEROSCOPY: SHX5533

## 2019-07-25 HISTORY — PX: TEE WITHOUT CARDIOVERSION: SHX5443

## 2019-07-25 HISTORY — PX: BIOPSY: SHX5522

## 2019-07-25 HISTORY — PX: COLONOSCOPY WITH PROPOFOL: SHX5780

## 2019-07-25 LAB — CBC
HCT: 30.1 % — ABNORMAL LOW (ref 39.0–52.0)
Hemoglobin: 8.7 g/dL — ABNORMAL LOW (ref 13.0–17.0)
MCH: 20.5 pg — ABNORMAL LOW (ref 26.0–34.0)
MCHC: 28.9 g/dL — ABNORMAL LOW (ref 30.0–36.0)
MCV: 71 fL — ABNORMAL LOW (ref 80.0–100.0)
Platelets: 326 10*3/uL (ref 150–400)
RBC: 4.24 MIL/uL (ref 4.22–5.81)
RDW: 17.5 % — ABNORMAL HIGH (ref 11.5–15.5)
WBC: 10.5 10*3/uL (ref 4.0–10.5)
nRBC: 0 % (ref 0.0–0.2)

## 2019-07-25 LAB — GLUCOSE, CAPILLARY
Glucose-Capillary: 104 mg/dL — ABNORMAL HIGH (ref 70–99)
Glucose-Capillary: 105 mg/dL — ABNORMAL HIGH (ref 70–99)
Glucose-Capillary: 105 mg/dL — ABNORMAL HIGH (ref 70–99)
Glucose-Capillary: 134 mg/dL — ABNORMAL HIGH (ref 70–99)
Glucose-Capillary: 229 mg/dL — ABNORMAL HIGH (ref 70–99)
Glucose-Capillary: 236 mg/dL — ABNORMAL HIGH (ref 70–99)
Glucose-Capillary: 63 mg/dL — ABNORMAL LOW (ref 70–99)

## 2019-07-25 LAB — COMPREHENSIVE METABOLIC PANEL
ALT: 12 U/L (ref 0–44)
AST: 19 U/L (ref 15–41)
Albumin: 2.1 g/dL — ABNORMAL LOW (ref 3.5–5.0)
Alkaline Phosphatase: 54 U/L (ref 38–126)
Anion gap: 7 (ref 5–15)
BUN: 5 mg/dL — ABNORMAL LOW (ref 8–23)
CO2: 31 mmol/L (ref 22–32)
Calcium: 8.5 mg/dL — ABNORMAL LOW (ref 8.9–10.3)
Chloride: 98 mmol/L (ref 98–111)
Creatinine, Ser: 0.78 mg/dL (ref 0.61–1.24)
GFR calc Af Amer: >60 mL/min (ref >60)
GFR calc non Af Amer: >60 mL/min (ref >60)
Glucose, Bld: 110 mg/dL — ABNORMAL HIGH (ref 70–99)
Potassium: 3.7 mmol/L (ref 3.5–5.1)
Sodium: 136 mmol/L (ref 135–145)
Total Bilirubin: 0.4 mg/dL (ref 0.3–1.2)
Total Protein: 6.1 g/dL — ABNORMAL LOW (ref 6.5–8.1)

## 2019-07-25 LAB — BLOOD GAS, ARTERIAL
Acid-Base Excess: 2.7 mmol/L — ABNORMAL HIGH (ref 0.0–2.0)
Bicarbonate: 26.9 mmol/L (ref 20.0–28.0)
Drawn by: 30136
FIO2: 40
O2 Saturation: 91.2 %
Patient temperature: 37.4
pCO2 arterial: 44.1 mmHg (ref 32.0–48.0)
pH, Arterial: 7.405 (ref 7.350–7.450)
pO2, Arterial: 64.8 mmHg — ABNORMAL LOW (ref 83.0–108.0)

## 2019-07-25 LAB — MAGNESIUM: Magnesium: 1.8 mg/dL (ref 1.7–2.4)

## 2019-07-25 LAB — PHOSPHORUS: Phosphorus: 4.2 mg/dL (ref 2.5–4.6)

## 2019-07-25 SURGERY — ECHOCARDIOGRAM, TRANSESOPHAGEAL
Anesthesia: Monitor Anesthesia Care

## 2019-07-25 MED ORDER — GUAIFENESIN 100 MG/5ML PO SOLN: 5.0000 mL | ORAL | Status: DC | PRN

## 2019-07-25 MED ORDER — INSULIN DETEMIR 100 UNIT/ML ~~LOC~~ SOLN
17.0000 [IU] | Freq: Every day | SUBCUTANEOUS | Status: DC
  Filled 2019-07-25: qty 0.17

## 2019-07-25 MED ORDER — DEXTROSE 50 % IV SOLN
INTRAVENOUS | Status: AC
  Filled 2019-07-25: qty 50

## 2019-07-25 MED ORDER — LORAZEPAM 2 MG/ML IJ SOLN: 1.0000 mg | INTRAMUSCULAR | Status: DC | PRN

## 2019-07-25 MED ORDER — HEPARIN (PORCINE) 25000 UT/250ML-% IV SOLN
1900.0000 [IU]/h | INTRAVENOUS | Status: DC
  Administered 2019-07-25: 1900 [IU]/h via INTRAVENOUS

## 2019-07-25 MED ORDER — PROPOFOL 500 MG/50ML IV EMUL
INTRAVENOUS | Status: DC | PRN
  Administered 2019-07-25: 100 ug/kg/min via INTRAVENOUS

## 2019-07-25 MED ORDER — PROPOFOL 10 MG/ML IV BOLUS
INTRAVENOUS | Status: DC | PRN
  Administered 2019-07-25: 20 mg via INTRAVENOUS
  Administered 2019-07-25: 40 mg via INTRAVENOUS

## 2019-07-25 MED ORDER — SODIUM CHLORIDE 0.9 % IV SOLN: INTRAVENOUS | Status: DC

## 2019-07-25 MED ORDER — DEXTROSE 50 % IV SOLN
12.5000 g | INTRAVENOUS | Status: AC
  Administered 2019-07-25: 12.5 g via INTRAVENOUS

## 2019-07-25 MED ORDER — EPHEDRINE SULFATE-NACL 50-0.9 MG/10ML-% IV SOSY
PREFILLED_SYRINGE | INTRAVENOUS | Status: DC | PRN
  Administered 2019-07-25 (×2): 5 mg via INTRAVENOUS

## 2019-07-25 MED ORDER — PHENYLEPHRINE HCL-NACL 10-0.9 MG/250ML-% IV SOLN
INTRAVENOUS | Status: DC | PRN
  Administered 2019-07-25: 40 ug/min via INTRAVENOUS

## 2019-07-25 MED ORDER — INSULIN DETEMIR 100 UNIT/ML ~~LOC~~ SOLN: 20.0000 [IU] | Freq: Every day | SUBCUTANEOUS | Status: DC

## 2019-07-25 MED ORDER — LIDOCAINE HCL (CARDIAC) PF 100 MG/5ML IV SOSY
PREFILLED_SYRINGE | INTRAVENOUS | Status: DC | PRN
  Administered 2019-07-25: 40 mg via INTRAVENOUS

## 2019-07-25 MED ORDER — METOPROLOL TARTRATE 5 MG/5ML IV SOLN
INTRAVENOUS | Status: AC
  Administered 2019-07-25: 5 mg
  Filled 2019-07-25: qty 5

## 2019-07-25 MED ORDER — LACTATED RINGERS IV SOLN: INTRAVENOUS | Status: DC

## 2019-07-25 MED ORDER — PHENYLEPHRINE 40 MCG/ML (10ML) SYRINGE FOR IV PUSH (FOR BLOOD PRESSURE SUPPORT)
PREFILLED_SYRINGE | INTRAVENOUS | Status: DC | PRN
  Administered 2019-07-25: 160 ug via INTRAVENOUS
  Administered 2019-07-25: 80 ug via INTRAVENOUS
  Administered 2019-07-25: 40 ug via INTRAVENOUS
  Administered 2019-07-25: 120 ug via INTRAVENOUS
  Administered 2019-07-25: 160 ug via INTRAVENOUS
  Administered 2019-07-25: 80 ug via INTRAVENOUS

## 2019-07-25 MED ORDER — PEG-KCL-NACL-NASULF-NA ASC-C 100 G PO SOLR
1.0000 | ORAL | Status: AC
  Administered 2019-07-25: 200 g via ORAL
  Filled 2019-07-25: qty 1

## 2019-07-25 MED ORDER — SODIUM CHLORIDE 0.9 % IV SOLN
0.5000 mg/h | INTRAVENOUS | Status: DC
  Administered 2019-07-25 – 2019-07-26 (×2): 0.5 mg/h via INTRAVENOUS
  Filled 2019-07-25 (×2): qty 2.5

## 2019-07-25 MED ORDER — ONDANSETRON HCL 4 MG/2ML IJ SOLN
INTRAMUSCULAR | Status: DC | PRN
  Administered 2019-07-25: 4 mg via INTRAVENOUS

## 2019-07-25 MED ORDER — METOPROLOL TARTRATE 5 MG/5ML IV SOLN
5.0000 mg | Freq: Once | INTRAVENOUS | Status: DC
  Filled 2019-07-25: qty 5

## 2019-07-25 SURGICAL SUPPLY — 22 items

## 2019-07-25 NOTE — Progress Notes (Addendum)
    Patient off floor GI work up. He states he has constant abdominal pain that doesn't change post prandial.    Will continue to follow.  Roxy Horseman PA-C  I have seen and evaluated the patient. I agree with the PA note as documented above. Off floor this am.  Will follow endoscopy and TEE today.  Marty Heck, MD [facility] Office: [phone]

## 2019-07-25 NOTE — Interval H&P Note (Signed)
History and Physical Interval Note:  07/25/2019 8:03 AM  Nathan Perkins  has presented today for surgery, with the diagnosis of IDA.  The various methods of treatment have been discussed with the patient and family. After consideration of risks, benefits and other options for treatment, the patient has consented to  Procedure(s): TRANSESOPHAGEAL ECHOCARDIOGRAM (TEE) (N/A) ENTEROSCOPY (N/A) COLONOSCOPY WITH PROPOFOL (N/A) as a surgical intervention.  The patient's history has been reviewed, patient examined, no change in status, stable for surgery.  I have reviewed the patient's chart and labs.  Questions were answered to the patient's satisfaction.     Jackquline Denmark

## 2019-07-25 NOTE — Anesthesia Preprocedure Evaluation (Signed)
Anesthesia Evaluation  Patient identified by MRN, date of birth, ID band Patient awake    Reviewed: Allergy & Precautions, NPO status , Patient's Chart, lab work & pertinent test results  History of Anesthesia Complications (+) PONV  Airway Mallampati: II  TM Distance: >3 FB Neck ROM: Full    Dental  (+) Dental Advisory Given   Pulmonary former smoker,    breath sounds clear to auscultation       Cardiovascular hypertension, Pt. on medications + Peripheral Vascular Disease   Rhythm:Regular Rate:Normal     Neuro/Psych CVA    GI/Hepatic negative GI ROS, Neg liver ROS,   Endo/Other  diabetes, Type 2  Renal/GU negative Renal ROS     Musculoskeletal   Abdominal   Peds  Hematology  (+) anemia ,   Anesthesia Other Findings   Reproductive/Obstetrics                             Anesthesia Physical Anesthesia Plan  ASA: IV  Anesthesia Plan: MAC   Post-op Pain Management:    Induction: Intravenous  PONV Risk Score and Plan: 2 and Propofol infusion, Ondansetron and Treatment may vary due to age or medical condition  Airway Management Planned: Natural Airway and Nasal Cannula  Additional Equipment:   Intra-op Plan:   Post-operative Plan:   Informed Consent: I have reviewed the patients History and Physical, chart, labs and discussed the procedure including the risks, benefits and alternatives for the proposed anesthesia with the patient or authorized representative who has indicated his/her understanding and acceptance.       Plan Discussed with: CRNA  Anesthesia Plan Comments:         Anesthesia Quick Evaluation

## 2019-07-25 NOTE — Anesthesia Postprocedure Evaluation (Signed)
Anesthesia Post Note  Patient: Nathan Perkins  Procedure(s) Performed: TRANSESOPHAGEAL ECHOCARDIOGRAM (TEE) (N/A ) ENTEROSCOPY (N/A ) COLONOSCOPY WITH PROPOFOL (N/A ) BIOPSY     Patient location during evaluation: PACU Anesthesia Type: MAC Level of consciousness: awake and alert Pain management: pain level controlled Vital Signs Assessment: post-procedure vital signs reviewed and stable Respiratory status: spontaneous breathing, nonlabored ventilation, respiratory function stable and patient connected to nasal cannula oxygen Cardiovascular status: stable and blood pressure returned to baseline Postop Assessment: no apparent nausea or vomiting Anesthetic complications: no    Last Vitals:  Vitals:   07/25/19 0940 07/25/19 1001  BP:  114/74  Pulse: 97 93  Resp: (!) 22 20  Temp:    SpO2: 98% 100%    Last Pain:  Vitals:   07/25/19 1001  TempSrc:   PainSc: 6                  Tiajuana Amass

## 2019-07-25 NOTE — Progress Notes (Signed)
Chaplain engaged in spiritual care visit with Nathan Perkins and his family (wife, son, and brother).  Nathan Perkins discussed his battle with COVID and the residual impact that has had on his body.  He also discussed how he believed that he had not been direct enough in his prayers to God about the ways in which he wanted to be healed.  He stated that God always answers prayers but that sometimes God doesn't answer them the way in which we want him too.  Chaplain offered empathic listening during this time as Nathan Perkins described his experiences and theology.  Mr. Nathan Perkins at one point noted, "I'm ok.  I'm comfortable," signifying how he feels currently.  His wife also noted that through talking with her husband she has been able to accept God's will and decision for her husband's life.   Nathan Perkins asked that chaplain pray with the specific request that a prayer be made for his whole family to be saved and to know God.  Chaplain offered prayer and support with family around Nathan Perkins.    Chaplain is available to follow-up as needed.

## 2019-07-25 NOTE — CV Procedure (Signed)
    TRANSESOPHAGEAL ECHOCARDIOGRAM   NAME:  UZZIEL RIEDINGER   MRN: [id number] DOB:  1955/10/15   ADMIT DATE: 07/20/2019  INDICATIONS: Bacteremia  PROCEDURE:   Informed consent was obtained prior to the procedure. The risks, benefits and alternatives for the procedure were discussed and the patient comprehended these risks.  Risks include, but are not limited to, cough, sore throat, vomiting, nausea, somnolence, esophageal and stomach trauma or perforation, bleeding, low blood pressure, aspiration, pneumonia, infection, trauma to the teeth and death.    Procedural time out performed. Monitored anesthesia care was provided in conjunction with GI enteroscopy, totals to be included under that procedure. Administered under the supervision of Dr. Ola Spurr.  The transesophageal probe was inserted in the esophagus and stomach without difficulty and multiple views were obtained.    COMPLICATIONS:    There were no immediate complications.  FINDINGS:  LEFT VENTRICLE: EF = 60-55% No regional wall motion abnormalities.  RIGHT VENTRICLE: Normal size and function.   LEFT ATRIUM: No thrombus/mass.  LEFT ATRIAL APPENDAGE: No thrombus/mass.   RIGHT ATRIUM: No thrombus/mass.  AORTIC VALVE:  Trileaflet. Severe aortic regurgitation, filling LVOT and pressure half time 150 msec. 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 There are fenestrations present in the cusps. There is a highly mobild >3.5 cm vegetation that moves between the LVOT side and aorta side with systole and diastole. High suspicion that this is the source of septic emboli.  MITRAL VALVE:    Normal structure. Trivial regurgitation. There is a small thickened, slightly mobile mass on the posterior leaflet concerning for small vegetation  TRICUSPID VALVE: Normal structure. Trivial regurgitation. No vegetation.  PULMONIC VALVE: Grossly normal structure. Trivial regurgitation. No apparent vegetation.  INTERATRIAL  SEPTUM: No PFO or ASD seen by color Doppler.  PERICARDIUM: No effusion noted.  DESCENDING AORTA: Mild diffuse plaque seen   CONCLUSION: Aortic valve endocarditis, with severe sequelae. Severe AI, all three leaflets thickened, cusp fenestrations present, and highly mobile >3.5 cm mass that moves between LVOT and Aorta with systole and diastole. High risk of embolism and likely source of septic emboli. Recommend CT surgery evaluation.   Buford Dresser, MD, PhD [facility]  [address], [place], [place] [address] [phone]   8:40 AM

## 2019-07-25 NOTE — Progress Notes (Addendum)
Inpatient Diabetes Program Recommendations  AACE/ADA: New Consensus Statement on Inpatient Glycemic Control  Target Ranges:  Prepandial:   less than 140 mg/dL      Peak postprandial:   less than 180 mg/dL (1-2 hours)      Critically ill patients:  140 - 180 mg/dL   Results for Nathan Perkins, Nathan Perkins (MRN [id number]) as of 07/25/2019 10:13  Ref. Range 07/24/2019 11:04 07/24/2019 16:02 07/24/2019 22:02 07/25/2019 00:42 07/25/2019 04:14 07/25/2019 07:06 07/25/2019 09:57  Glucose-Capillary Latest Ref Range: 70 - 99 mg/dL 135 (H) 83 147 (H) 134 (H) 104 (H) 63 (L) 105 (H)   Review of Glycemic Control  Current orders for Inpatient glycemic control: Levemir 20 units BID, Novolog 0-9 units TID with meals  Inpatient Diabetes Program Recommendations:    Insulin- Fasting glucose 63 mg/dl at 7:06 today. Please consider changing Levemir to 20 units QAM and Levemir 17 units QHS.   Thanks, Barnie Alderman, RN, MSN, CDE Diabetes Coordinator Inpatient Diabetes Program [phone] (Team Pager from 8am to 5pm)

## 2019-07-25 NOTE — Op Note (Signed)
[facility] Patient Name: Nathan Perkins Procedure Date : 07/25/2019 MRN: [id number] Attending MD: Jackquline Denmark , MD Date of Birth: 1956-01-17 CSN: [id number] Age: 64 Admit Type: Inpatient Procedure:                Colonoscopy Indications:              Iron deficiency anemia Providers:                Jackquline Denmark, MD, Jeanella Cara, RN, Theodora Blow, Technician Referring MD:              Medicines:                Monitored Anesthesia Care Complications:            No immediate complications. Estimated Blood Loss:     Estimated blood loss: none. Procedure:                Pre-Anesthesia Assessment:                           - Prior to the procedure, a History and Physical                            was performed, and patient medications and                            allergies were reviewed. The patient's tolerance of                            previous anesthesia was also reviewed. The risks                            and benefits of the procedure and the sedation                            options and risks were discussed with the patient.                            All questions were answered, and informed consent                            was obtained. Prior Anticoagulants: The patient has                            taken heparin, last dose was day of procedure. ASA                            Grade Assessment: III - A patient with severe                            systemic disease. After reviewing the risks and  benefits, the patient was deemed in satisfactory                            condition to undergo the procedure.                           - Prior to the procedure, a History and Physical                            was performed, and patient medications and                            allergies were reviewed. The patient's tolerance of                            previous anesthesia was also reviewed.  The risks                            and benefits of the procedure and the sedation                            options and risks were discussed with the patient.                            All questions were answered, and informed consent                            was obtained. Prior Anticoagulants: The patient has                            taken heparin, last dose was day of procedure. ASA                            Grade Assessment: III - A patient with severe                            systemic disease. After reviewing the risks and                            benefits, the patient was deemed in satisfactory                            condition to undergo the procedure.                           After obtaining informed consent, the colonoscope                            was passed under direct vision. Throughout the                            procedure, the patient's blood pressure, pulse, and  oxygen saturations were monitored continuously. The                            [id number] Olympus colonoscope was                            introduced through the anus and advanced to the 2                            cm into the ileum. The colonoscopy was performed                            without difficulty. The patient tolerated the                            procedure well. The quality of the bowel                            preparation was good. The terminal ileum, ileocecal                            valve, appendiceal orifice, and rectum were                            photographed. Scope In: 8:58:55 AM Scope Out: 9:11:53 AM Scope Withdrawal Time: 0 hours 7 minutes 4 seconds  Total Procedure Duration: 0 hours 12 minutes 58 seconds  Findings:      A few small-mouthed diverticula were found in the sigmoid colon and few       in ascending colon.      Non-bleeding external and internal hemorrhoids were found during       retroflexion and during  perianal exam. The hemorrhoids were small.      The terminal ileum appeared normal.      The exam was otherwise without abnormality on direct and retroflexion       views. Impression:               -Mild pancolonic diverticulosis predominantly in                            the sigmoid colon.                           -Non-bleeding external and internal hemorrhoids.                           -Otherwise normal colonoscopy to TI. Recommendation:           - Return patient to hospital ward for ongoing care.                           - Resume previous diet.                           - Continue present medications.                           -  Can resume heparin in 4 hours or earlier per                            cardiology. Note that the patient had TEE prior to                            endoscopic procedures showing significant aortic                            vegetation which would require CTVS intervention.                           - The findings and recommendations were discussed                            with the patient and cardiology.                           - We we will sign off for now. Please call with any                            questions. Procedure Code(s):        --- Professional ---                           [id number], Colonoscopy, flexible; diagnostic, including                            collection of specimen(s) by brushing or washing,                            when performed (separate procedure) Diagnosis Code(s):        --- Professional ---                           K64.8, Other hemorrhoids                           D50.9, Iron deficiency anemia, unspecified                           K57.30, Diverticulosis of large intestine without                            perforation or abscess without bleeding CPT copyright 2019 American Medical Association. All rights reserved. The codes documented in this report are preliminary and upon coder review may  be revised to meet  current compliance requirements. Jackquline Denmark, MD 07/25/2019 9:31:08 AM This report has been signed electronically. Number of Addenda: 0

## 2019-07-25 NOTE — Anesthesia Procedure Notes (Signed)
Procedure Name: MAC Date/Time: 07/25/2019 8:03 AM Performed by: Leonor Liv, CRNA Pre-anesthesia Checklist: Patient identified, Emergency Drugs available, Suction available, Patient being monitored and Timeout performed Patient Re-evaluated:Patient Re-evaluated prior to induction Oxygen Delivery Method: Nasal cannula Airway Equipment and Method: Bite block Placement Confirmation: positive ETCO2 Dental Injury: Teeth and Oropharynx as per pre-operative assessment

## 2019-07-25 NOTE — Progress Notes (Addendum)
   07/25/19 1459  Assess: MEWS Score  BP (!) 177/87  Pulse Rate (!) 137  Resp (!) 23  SpO2 92 %  O2 Device Room Air  Assess: MEWS Score  MEWS Temp 0  MEWS Systolic 0  MEWS Pulse 3  MEWS RR 1  MEWS LOC 0  MEWS Score 4  MEWS Score Color Red  Assess: if the MEWS score is Yellow or Red  Were vital signs taken at a resting state? Yes  Focused Assessment Documented focused assessment  Early Detection of Sepsis Score *See Row Information* Medium  MEWS guidelines implemented *See Row Information* Yes  Treat  MEWS Interventions Escalated (See documentation below) (x--ray, ekg, abge ordered)  Take Vital Signs  Increase Vital Sign Frequency  Red: Q 1hr X 4 then Q 4hr X 4, if remains red, continue Q 4hrs  Escalate  MEWS: Escalate Red: discuss with charge nurse/RN and provider, consider discussing with RRT  Notify: Charge Nurse/RN  Name of Charge Nurse/RN Notified Myrtie Cruise  Date Charge Nurse/RN Notified 07/25/19  Time Charge Nurse/RN Notified [id number]  Notify: Provider  Provider Name/Title Cherylann Ratel  Date Provider Notified 07/25/19  Time Provider Notified 1459  Notification Type Face-to-face  Notification Reason Change in status  Response See new orders  Date of Provider Response 07/25/19  Time of Provider Response 1459  Notify: Rapid Response  Name of Rapid Response RN Notified Shelby  Date Rapid Response Notified 07/25/19  Time Rapid Response Notified 1500  Document  Patient Outcome Transferred/level of care increased  MD called to bedside along w/ rapid response shelby O , at bedside. MD ordered  EKG, ABG, X--ray and transferred for a cardiac progressive unit were also put in.

## 2019-07-25 NOTE — Progress Notes (Signed)
Progress Note    Nathan Perkins  [id number] DOB: 06-07-1955  DOA: 07/20/2019 PCP: Monico Blitz, MD    Brief Narrative:    Medical records reviewed and are as summarized below:  Nathan Perkins is an 64 y.o. 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  Further work-up reveals bacteremia, Enterococcus and AV valve on TTE concerning for AV endocarditis, TEE revealing aortic valve endocarditis with, severe AI, all 3 leaflets thickened with highly mobile 3.5 cm mass.  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 -CVTS consult -Continue antibiotics -Continue heparin -Monitor  2.  Anemia.  Iron deficiency anemia.  Status post EGD today.  Revealing a couple polyps otherwise within the limits of normal.  Hemoglobin stable at 8.5. -Monitor  #3.  Diabetes.  Poor control.  Home medications include long-acting insulin as well as sliding scale.  Fasting glucose 63 this morning. -We change Levemir dosing -Continue sliding scale  #4.  Hypertension.  Blood pressure somewhat labile.  Home medications include lisinopril and amlodipine. -Continue to hold home meds for now -Monitor  #5.  Chronic respiratory failure secondary  to post Covid interstitial lung disease.  Patient is on home oxygen at 2 L.  Currently oxygen saturation level greater than 90% on 2 L.  Stable -Monitor oxygen saturation level -Continue oxygen supplementation   Family Communication/Anticipated D/C date and plan/Code Status   DVT prophylaxis: heparin ordered. Code Status: Full Code.  Family Communication: wife on phone Disposition Plan: Status is: Inpatient  Remains inpatient appropriate because:Inpatient level of care appropriate due to severity of illness   Dispo: The patient is from: Home              Anticipated d/c is to: SNF              Anticipated d/c date is: > 3 days              Patient currently is not medically stable to d/c.          Medical Consultants:    None.   Anti-Infectives:    None  Subjective:   Awake but drowsy. Continues with abdominal pain  Objective:    Vitals:   07/25/19 0924 07/25/19 0930 07/25/19 0940 07/25/19 1001  BP: (!) 116/53 (!) 98/53  114/74  Pulse: 98 98 97 93  Resp: (!) 26 (!) 21 (!) 22 20  Temp: (!) 97.2 F (36.2 C)     TempSrc: Temporal     SpO2: 96% 98% 98% 100%  Weight:      Height:        Intake/Output Summary (Last 24 hours) at 07/25/2019 1058 Last data filed at 07/25/2019 0836 Gross per 24 hour  Intake 1805.13 ml  Output 725 ml  Net 1080.13 ml  Filed Weights   07/22/19 2203 07/23/19 2125 07/25/19 0727  Weight: 76.6 kg 79 kg 79 kg    Exam: General: Awake slightly pale acutely ill-appearing no acute distress CV: Regular rate and rhythm no murmur gallop or rub pedal pulses present palpable Respiratory: No increased work of breathing breath sounds clear bilaterally hear no wheeze no crackles GI: Soft nondistended positive bowel sounds. MSK: joints without swelling/erythema Neuro: drowsy oriented x3  Speech clear moves all extremeties  Data Reviewed:   I have personally reviewed following labs and imaging studies:  Labs: Labs show the following:    Basic Metabolic Panel: Recent Labs  Lab 07/20/19 1448 07/20/19 1448 07/21/19 0236 07/21/19 0236 07/22/19 0230 07/25/19 0334  NA 132*  --  132*  --  130* 136  K 4.4   < > 4.8   < > 5.3* 3.7  CL 97*  --  98  --  96* 98  CO2 28  --  27  --  23 31  GLUCOSE 157*  --  241*  --  459* 110*  BUN 16  --  16  --  19 5*  CREATININE 0.97  --  1.03  --  0.97 0.78  CALCIUM 8.7*  --  8.3*  --  8.1* 8.5*  MG  --   --   --   --   --  1.8  PHOS  --   --   --   --   --  4.2   < > = values in this interval not displayed.   GFR Estimated Creatinine Clearance: 103.7 mL/min (by C-G formula based on SCr of 0.78 mg/dL). Liver Function Tests: Recent Labs  Lab 07/20/19 1448 07/25/19 0334  AST 15 19  ALT 10 12  ALKPHOS 47 54  BILITOT 0.8 0.4  PROT 7.0 6.1*  ALBUMIN 2.6* 2.1*   No results for input(s): LIPASE, AMYLASE in the last 168 hours. No results for input(s): AMMONIA in the last 168 hours. Coagulation profile No results for input(s): INR, PROTIME in the last 168 hours.  CBC: Recent Labs  Lab 07/21/19 1641 07/22/19 0230 07/23/19 0524 07/24/19 0533 07/25/19 0334  WBC 5.0 5.5 4.6 6.8 10.5  HGB 8.0* 8.4* 8.4* 8.2* 8.7*  HCT 26.9* 28.7* 29.0* 28.2* 30.1*  MCV 69.7* 70.7* 70.4* 70.5* 71.0*  PLT 174 191 253 255 326   Cardiac Enzymes: No results for input(s): CKTOTAL, CKMB, CKMBINDEX, TROPONINI in the last 168 hours. BNP (last 3 results) No results for input(s): PROBNP in the last 8760 hours. CBG: Recent Labs  Lab 07/24/19 2202 07/25/19 0042 07/25/19 0414 07/25/19 0706 07/25/19 0957  GLUCAP 147* 134* 104* 63* 105*   D-Dimer: No results for input(s): DDIMER in the last 72 hours. Hgb A1c: No results for input(s): HGBA1C in the last 72 hours. Lipid Profile: No results for input(s): CHOL, HDL, LDLCALC, TRIG, CHOLHDL, LDLDIRECT in the last 72 hours. Thyroid function studies: No results for input(s): TSH, T4TOTAL, T3FREE, THYROIDAB in the last 72 hours.  Invalid input(s):  FREET3 Anemia work up: No results for input(s): VITAMINB12, FOLATE, FERRITIN, TIBC, IRON, RETICCTPCT in the last 72 hours. Sepsis Labs: Recent Labs  Lab 07/22/19 0230 07/23/19 0524 07/24/19 0533 07/25/19 0334  WBC 5.5 4.6 6.8 10.5    Microbiology Recent Results (from the past 240 hour(s))  SARS CORONAVIRUS 2 (TAT 6-24 HRS) Nasopharyngeal Nasopharyngeal Swab     Status: None   Collection Time: 07/20/19  5:49 PM   Specimen: Nasopharyngeal Swab  Result Value Ref Range Status   SARS Coronavirus 2 NEGATIVE NEGATIVE Final    Comment: (NOTE) SARS-CoV-2 target nucleic acids are NOT DETECTED. The SARS-CoV-2 RNA is generally detectable in upper and lower respiratory specimens during the acute phase of infection. Negative results do not preclude SARS-CoV-2 infection, do not rule out co-infections with other pathogens, and should not be used as the sole basis for treatment or other patient management decisions. Negative results must be combined with clinical observations, patient history, and epidemiological information. The expected result is Negative. Fact Sheet for Patients: SugarRoll.be Fact Sheet for Healthcare Providers: https://www.woods-mathews.com/ This test is not yet approved or cleared by the [place] FDA and  has been authorized for detection and/or diagnosis of SARS-CoV-2 by FDA under an Emergency Use Authorization (EUA). This EUA will remain  in effect (meaning this test can be used) for the duration of the COVID-19 declaration under Section 56 4(b)(1) of the Act, 21 U.S.C. section 360bbb-3(b)(1), unless the authorization is terminated or revoked sooner. Performed at [facility], [place] [address], [place], [place] [address]   Blood culture (routine x 2)     Status: None (Preliminary result)   Collection Time: 07/20/19  6:39 PM   Specimen: BLOOD  Result Value Ref Range Status   Specimen Description BLOOD LEFT  ANTECUBITAL  Final   Special Requests   Final    BOTTLES DRAWN AEROBIC AND ANAEROBIC Blood Culture results may not be optimal due to an inadequate volume of blood received in culture bottles   Culture  Setup Time   Final    IN BOTH AEROBIC AND ANAEROBIC BOTTLES GRAM POSITIVE COCCI IN CHAINS CRITICAL VALUE NOTED.  VALUE IS CONSISTENT WITH PREVIOUSLY REPORTED AND CALLED VALUE.    Culture   Final    GRAM POSITIVE COCCI TOO YOUNG TO READ Performed at [facility], [place] [address], [place], [place] [address]    Report Status PENDING  Incomplete  Blood culture (routine x 2)     Status: Abnormal (Preliminary result)   Collection Time: 07/20/19  6:39 PM   Specimen: BLOOD  Result Value Ref Range Status   Specimen Description BLOOD RIGHT ANTECUBITAL  Final   Special Requests   Final    BOTTLES DRAWN AEROBIC AND ANAEROBIC Blood Culture results may not be optimal due to an inadequate volume of blood received in culture bottles   Culture  Setup Time   Final    GRAM POSITIVE COCCI IN CHAINS IN BOTH AEROBIC AND ANAEROBIC BOTTLES Organism ID to follow CRITICAL RESULT CALLED TO, READ BACK BY AND VERIFIED WITH: [name] [id number] 07/21/19 A BROWNING    Culture (A)  Final    ENTEROCOCCUS FAECALIS SUSCEPTIBILITIES PERFORMED ON PREVIOUS CULTURE WITHIN THE LAST 5 DAYS. Performed at [facility], [place] [address] [place], [place] [address]    Report Status PENDING  Incomplete   Organism ID, Bacteria ENTEROCOCCUS FAECALIS  Final      Susceptibility   Enterococcus faecalis - MIC*    AMPICILLIN <=2 SENSITIVE Sensitive     VANCOMYCIN 1 SENSITIVE Sensitive     * ENTEROCOCCUS FAECALIS  Blood Culture ID Panel (Reflexed)     Status: Abnormal   Collection Time: 07/20/19  6:39 PM  Result Value Ref Range Status   Enterococcus species DETECTED (A) NOT DETECTED Final    Comment: CRITICAL RESULT CALLED TO, READ BACK BY AND VERIFIED WITHHughie Closs PHARMD [id number] 07/21/19 A BROWNING  Vancomycin  resistance NOT DETECTED NOT DETECTED Final   Listeria monocytogenes NOT DETECTED NOT DETECTED Final   Staphylococcus species NOT DETECTED NOT DETECTED Final   Staphylococcus aureus (BCID) NOT DETECTED NOT DETECTED Final   Streptococcus species NOT DETECTED NOT DETECTED Final   Streptococcus agalactiae NOT DETECTED NOT DETECTED Final   Streptococcus pneumoniae NOT DETECTED NOT DETECTED Final   Streptococcus pyogenes NOT DETECTED NOT DETECTED Final   Acinetobacter baumannii NOT DETECTED NOT DETECTED Final   Enterobacteriaceae species NOT DETECTED NOT DETECTED Final   Enterobacter cloacae complex NOT DETECTED NOT DETECTED Final   Escherichia coli NOT DETECTED NOT DETECTED Final   Klebsiella oxytoca NOT DETECTED NOT DETECTED Final   Klebsiella pneumoniae NOT DETECTED NOT DETECTED Final   Proteus species NOT DETECTED NOT DETECTED Final   Serratia marcescens NOT DETECTED NOT DETECTED Final   Haemophilus influenzae NOT DETECTED NOT DETECTED Final   Neisseria meningitidis NOT DETECTED NOT DETECTED Final   Pseudomonas aeruginosa NOT DETECTED NOT DETECTED Final   Candida albicans NOT DETECTED NOT DETECTED Final   Candida glabrata NOT DETECTED NOT DETECTED Final   Candida krusei NOT DETECTED NOT DETECTED Final   Candida parapsilosis NOT DETECTED NOT DETECTED Final   Candida tropicalis NOT DETECTED NOT DETECTED Final    Comment: Performed at [facility], [address], [place], [place] [address]  Culture, blood (routine x 2)     Status: None (Preliminary result)   Collection Time: 07/22/19 10:27 AM   Specimen: BLOOD  Result Value Ref Range Status   Specimen Description BLOOD RIGHT ANTECUBITAL  Final   Special Requests   Final    BOTTLES DRAWN AEROBIC AND ANAEROBIC Blood Culture adequate volume   Culture   Final    NO GROWTH 2 DAYS Performed at [facility], [place] [address], [place], [place] [address]    Report Status PENDING  Incomplete  Culture, blood (routine x 2)     Status:  None (Preliminary result)   Collection Time: 07/22/19 10:33 AM   Specimen: BLOOD RIGHT HAND  Result Value Ref Range Status   Specimen Description BLOOD RIGHT HAND  Final   Special Requests   Final    BOTTLES DRAWN AEROBIC AND ANAEROBIC Blood Culture adequate volume   Culture   Final    NO GROWTH 2 DAYS Performed at [facility], [place] [address] [place], [place] [address]    Report Status PENDING  Incomplete    Procedures and diagnostic studies:  No results found.  Medications:   . dextrose      . HYDROmorphone   Intravenous Q4H  . insulin aspart  0-9 Units Subcutaneous TID WC  . insulin detemir  20 Units Subcutaneous BID  . pantoprazole  40 mg Oral Daily  . Ensure Max Protein  11 oz Oral TID BM  . sodium chloride flush  3 mL Intravenous Q12H   Continuous Infusions: . sodium chloride 1,000 mL (07/21/19 2147)  . ampicillin (OMNIPEN) IV 2 g (07/25/19 1054)  . cefTRIAXone (ROCEPHIN)  IV 2 g (07/25/19 1012)  . ferumoxytol 510 mg (07/22/19 1108)  . heparin 1,900 Units/hr (07/25/19 1048)     LOS: 4 days   Radene Gunning NP [facility]   How to contact the [facility] Attending or Consulting provider [place] or covering provider during after hours [place], for this patient?  1. Check the care team in [facility] and look for a) attending/consulting [place] provider listed and b)  the [facility] team listed 2. Log into [email or web address] and use [place]'s universal password to access. If you do not have the password, please contact the hospital operator. 3. Locate the [facility] provider you are looking for under [facility] and page to a number that you can be directly reached. 4. If you still have difficulty reaching the provider, please page the [facility] (Director on Call) for the Hospitalists listed on amion for assistance.  07/25/2019, 10:58 AM

## 2019-07-25 NOTE — Transfer of Care (Signed)
Immediate Anesthesia Transfer of Care Note  Patient: Nathan Perkins  Procedure(s) Performed: TRANSESOPHAGEAL ECHOCARDIOGRAM (TEE) (N/A ) ENTEROSCOPY (N/A ) COLONOSCOPY WITH PROPOFOL (N/A ) BIOPSY  Patient Location: Endoscopy Unit  Anesthesia Type:MAC  Level of Consciousness: awake, alert  and oriented  Airway & Oxygen Therapy: Patient Spontanous Breathing and Patient connected to nasal cannula oxygen  Post-op Assessment: Report given to RN, Post -op Vital signs reviewed and stable and Patient moving all extremities  Post vital signs: Reviewed and stable  Last Vitals:  Vitals Value Taken Time  BP 116/53 07/25/19 0924  Temp 36.2 C 07/25/19 0924  Pulse 97 07/25/19 0928  Resp 19 07/25/19 0928  SpO2 98 % 07/25/19 0928  Vitals shown include unvalidated device data.  Last Pain:  Vitals:   07/25/19 0924  TempSrc: Temporal  PainSc: Asleep      Patients Stated Pain Goal: 0 (16/10/96 0454)  Complications: No apparent anesthesia complications

## 2019-07-25 NOTE — Plan of Care (Signed)
  Problem: Activity: Goal: Risk for activity intolerance will decrease Outcome: Progressing   

## 2019-07-25 NOTE — Progress Notes (Signed)
ANTICOAGULATION CONSULT NOTE  Pharmacy Consult for Heparin Indication: renal/splenic infarct  Patient Measurements: Height: 6' (182.9 cm) Weight: 79 kg (174 lb 2.6 oz) IBW/kg (Calculated) : 77.6 Heparin Dosing Weight: 73.5 kg  Vital Signs: Temp: 97.2 F (36.2 C) (04/20 0924) Temp Source: Temporal (04/20 0924) BP: 114/74 (04/20 1001) Pulse Rate: 93 (04/20 1001)  Labs: Recent Labs    07/23/19 0524 07/23/19 0524 07/24/19 0533 07/24/19 1250 07/25/19 0334  HGB 8.4*   < > 8.2*  --  8.7*  HCT 29.0*  --  28.2*  --  30.1*  PLT 253  --  255  --  326  HEPARINUNFRC 0.29*  --  0.24* 0.26*  --   CREATININE  --   --   --   --  0.78   < > = values in this interval not displayed.    Estimated Creatinine Clearance: 103.7 mL/min (by C-G formula based on SCr of 0.78 mg/dL).  Assessment: 64 yr old male presented from PCP with renal/splenic infarct. Pharmacy was consulted to dose heparin.  Per H&P, the patient was noted to have a recent subacute small hemorrhagic infarct on MRI in January 2021. 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  Patient with endocarditis found on ECHO, no bleeding found on EGD/colonoscopy. OK to resume heparin at 1130 per GI    Goal of Therapy:  Heparin level: 0.3-0.5 units/ml Monitor platelets by anticoagulation protocol: Yes   Plan:  Restart heparin gtt at 1900 units/hr at 1130, no bolus HL in 6 hours from restart.  Daily HL/CBC   Kayal Mula A. Levada Dy, PharmD, BCPS, FNKF Clinical Pharmacist [place] Please utilize Amion for appropriate phone number to reach the unit pharmacist ([place])    07/25/2019 10:25 AM

## 2019-07-25 NOTE — Progress Notes (Signed)
  Echocardiogram Echocardiogram Transesophageal has been performed.  Bobbye Charleston 07/25/2019, 8:54 AM

## 2019-07-25 NOTE — Consult Note (Signed)
[address]       [place] [address]             [phone]        Vashawn L Wiens [place] Medical Record [id number] Date of Birth: 1956/02/17  Referring: No ref. provider found Primary Care: Monico Blitz, MD Primary Cardiologist:No primary care provider on file.  Chief Complaint:    Chief Complaint  Patient presents with  . Abdominal Pain    History of Present Illness:     64 year old male admitted to the hospital with acute onset abdominal pain.  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  He has a history of COVID-19 that was treated in October 2020 and was hospitalized for an entire month.  He developed Covid related interstitial lung disease and has had significant weight loss since being discharged.  He states that over the past 3 to 4 months he has lost about 40 pounds.  He also has a history of a cerebrovascular event that his wife stated on occasion had some altered mental status.  He does endorse some subjective fevers and chills.  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  CTS has been consulted to assist with management.     Past Medical History:  Diagnosis Date  . Bacteremia   . Cancer ([place])    skin cancer face- basal  . Chronic respiratory failure ([place])    due to IDL from covid  . Complication of anesthesia   . COVID-19 01/2019  . Diabetes ([place])    type 2 x 5 yrs  . Hypertension    since age 72  . PONV (postoperative nausea and vomiting) 06/25/2017  . Small vessel thrombosis of skin   . Stroke [facility])     Past Surgical History:  Procedure Laterality Date  . APPENDECTOMY    . CARDIAC CATHETERIZATION N/A 01/07/2015   Procedure: Left Heart Cath and Coronary Angiography;  Surgeon: Burnell Blanks, MD;  Location: [facility];  Service:  Cardiovascular;  Laterality: N/A;  . CERVICAL DISC SURGERY     with a plate   . CHOLECYSTECTOMY    . COLONOSCOPY N/A 03/15/2013   Procedure: COLONOSCOPY;  Surgeon: Rogene Houston, MD;  Location[facility];  Service: Endoscopy;  Laterality: N/A;  245  . cyst removal of neck    . KNEE ARTHROSCOPY Right   . LUMBAR DISC SURGERY  06/25/2017   lumbar 5  . LUMBAR LAMINECTOMY/DECOMPRESSION MICRODISCECTOMY N/A 07/30/2017   Procedure: MICRODISCECTOMY RIGHT LUMBAR FIVE- SACRAL ONE;  Surgeon: Erline Levine, MD;  Location: [facility];  Service: Neurosurgery;  Laterality: N/A;  MICRODISCECTOMY RIGHT LUMBAR 5- SACRAL 1      Social History   Tobacco Use  Smoking Status Former Smoker  . Packs/day: 2.00  . Years: 32.00  . Pack years: 64.00  . Types: Cigarettes  . Start date: 01/02/1969  . Quit date: 07/03/1999  . Years since quitting: 20.0  Smokeless Tobacco Former Systems developer  . Types: Chew    Social History   Substance and Sexual Activity  Alcohol Use No  . Alcohol/week: 0.0 standard drinks     Allergies  Allergen Reactions  . Invokana [Canagliflozin] Other (See Comments)    KETOACIDOSIS   . Percocet [Oxycodone-Acetaminophen] Itching  . Shellfish Allergy     Shrimp -  possible Iodine allergy UNSPECIFIED REACTION   . Sulfa Antibiotics     UNSPECIFIED REACTION       Current Facility-Administered Medications  Medication Dose Route Frequency Provider Last Rate Last Admin  . 0.9 %  sodium chloride infusion   Intravenous PRN Marylyn Ishihara, Tyrone A, DO 10 mL/hr at 07/21/19 2147 1,000 mL at 07/21/19 2147  . acetaminophen (TYLENOL) tablet 650 mg  650 mg Oral Q6H PRN Lenore Cordia, MD   650 mg at 07/21/19 1652   Or  . acetaminophen (TYLENOL) suppository 650 mg  650 mg Rectal Q6H PRN Zada Finders R, MD      . albuterol (PROVENTIL) (2.5 MG/3ML) 0.083% nebulizer solution 2.5 mg  2.5 mg Inhalation Q4H PRN Zada Finders R, MD      . ampicillin (OMNIPEN) 2 g in sodium chloride 0.9 % 100 mL IVPB  2 g  Intravenous Q4H Kyle, Tyrone A, DO 300 mL/hr at 07/25/19 1054 2 g at 07/25/19 1054  . benzonatate (TESSALON) capsule 100 mg  100 mg Oral TID PRN Radene Gunning, NP      . cefTRIAXone (ROCEPHIN) 2 g in sodium chloride 0.9 % 100 mL IVPB  2 g Intravenous Q12H Thayer Headings, MD 200 mL/hr at 07/25/19 1012 2 g at 07/25/19 1012  . dextrose 50 % solution           . diphenhydrAMINE (BENADRYL) injection 12.5 mg  12.5 mg Intravenous Q6H PRN Marylyn Ishihara, Tyrone A, DO       Or  . diphenhydrAMINE (BENADRYL) 12.5 MG/5ML elixir 12.5 mg  12.5 mg Oral Q6H PRN Marylyn Ishihara, Tyrone A, DO      . ferumoxytol (FERAHEME) 510 mg in sodium chloride 0.9 % 100 mL IVPB  510 mg Intravenous Weekly Volanda Napoleon, MD 468 mL/hr at 07/22/19 1108 510 mg at 07/22/19 1108  . heparin ADULT infusion 100 units/mL (25000 units/239mL sodium chloride 0.45%)  1,900 Units/hr Intravenous Continuous Joselyn Glassman A, RPH 19 mL/hr at 07/25/19 1048 1,900 Units/hr at 07/25/19 1048  . HYDROmorphone (DILAUDID) 1 mg/mL PCA injection   Intravenous Q4H Kyle, Tyrone A, DO   30 mg at 07/23/19 1337  . insulin aspart (novoLOG) injection 0-9 Units  0-9 Units Subcutaneous TID WC Lenore Cordia, MD   1 Units at 07/24/19 1240  . insulin detemir (LEVEMIR) injection 17 Units  17 Units Subcutaneous QHS Radene Gunning, NP      . Derrill Memo ON 07/26/2019] insulin detemir (LEVEMIR) injection 20 Units  20 Units Subcutaneous Daily Black, Karen M, NP      . naloxone [facility]) injection 0.4 mg  0.4 mg Intravenous PRN Marylyn Ishihara, Tyrone A, DO       And  . sodium chloride flush (NS) 0.9 % injection 9 mL  9 mL Intravenous PRN Marylyn Ishihara, Tyrone A, DO      . ondansetron (ZOFRAN) tablet 4 mg  4 mg Oral Q6H PRN Lenore Cordia, MD       Or  . ondansetron (ZOFRAN) injection 4 mg  4 mg Intravenous Q6H PRN Zada Finders R, MD      . oxyCODONE (Oxy IR/ROXICODONE) immediate release tablet 5 mg  5 mg Oral Q4H PRN Lenore Cordia, MD   5 mg at 07/23/19 0747  . pantoprazole (PROTONIX) EC tablet 40 mg  40 mg  Oral Daily Zada Finders R, MD   40 mg at 07/25/19 1026  . protein supplement (ENSURE MAX) liquid  11 oz Oral TID BM Cherylann Ratel  A, DO   11 oz at 07/25/19 1027  . senna-docusate (Senokot-S) tablet 1 tablet  1 tablet Oral QHS PRN Zada Finders R, MD      . sodium chloride flush (NS) 0.9 % injection 3 mL  3 mL Intravenous Q12H Zada Finders R, MD   3 mL at 07/25/19 1027    Medications Prior to Admission  Medication Sig Dispense Refill Last Dose  . amLODipine (NORVASC) 5 MG tablet Take 5 mg by mouth daily.   07/20/2019 at Unknown time  . aspirin EC 81 MG tablet Take 81 mg by mouth daily.   07/20/2019 at Unknown time  . benzonatate (TESSALON) 200 MG capsule Take 1 capsule (200 mg total) by mouth 3 (three) times daily as needed for cough. 90 capsule 1 07/20/2019 at Unknown time  . ferrous sulfate 325 (65 FE) MG tablet Take 325 mg by mouth daily with breakfast.   07/20/2019 at Unknown time  . Flaxseed, Linseed, (FLAXSEED OIL) 1200 MG CAPS Take 1 capsule by mouth daily.    07/20/2019 at Unknown time  . HUMALOG KWIKPEN 100 UNIT/ML KiwkPen Inject 4-10 Units into the muscle as needed (Sliding scale).    07/20/2019 at Unknown time  . Insulin Syringe-Needle U-100 25G X 1" 1 ML MISC For 4 times a day insulin SQ, 1 month supply. Diagnosis E11.65 (Patient taking differently: 1 each by Other route See admin instructions. For 4 times a day insulin SQ, 1 month supply. Diagnosis E11.65) 30 each 0 07/20/2019 at Unknown time  . lisinopril (PRINIVIL,ZESTRIL) 40 MG tablet Take 40 mg by mouth daily.   1 07/20/2019 at Unknown time  . methocarbamol (ROBAXIN) 500 MG tablet Take 500 mg by mouth 2 (two) times daily.   07/19/2019 at Unknown time  . NOVOLIN N RELION 100 UNIT/ML injection Inject 30-40 Units into the skin See admin instructions. Per sliding scale depends on blood sugar levels  0 07/20/2019 at Unknown time  . ondansetron (ZOFRAN) 4 MG tablet Take 4 mg by mouth every 8 (eight) hours as needed for nausea/vomiting.   Past Week  at Unknown time  . pantoprazole (PROTONIX) 40 MG tablet Take 40 mg by mouth daily.   07/20/2019 at Unknown time  . Vitamin D, Ergocalciferol, (DRISDOL) 50000 UNITS CAPS capsule Take 1 capsule by mouth every Sunday.   1 Past Week at Unknown time  . chlorpheniramine-HYDROcodone (TUSSIONEX PENNKINETIC ER) 10-8 MG/5ML SUER Take 5 mLs by mouth at bedtime as needed for cough. (Patient not taking: Reported on 07/20/2019) 140 mL 0 Completed Course at Unknown time    History reviewed. No pertinent family history.   Review of Systems:   Review of Systems  Constitutional: Positive for chills, fever, malaise/fatigue and weight loss.  Eyes: Positive for blurred vision.  Respiratory: Positive for shortness of breath. Negative for cough.   Cardiovascular: Negative for leg swelling.  Gastrointestinal: Positive for abdominal pain.  Musculoskeletal: Positive for back pain, myalgias and neck pain.  Neurological: Positive for dizziness and weakness.  Psychiatric/Behavioral: Positive for depression.      Physical Exam: BP 114/74 (BP Location: Right Arm)   Pulse 93   Temp (!) 97.2 F (36.2 C) (Temporal)   Resp 20   Ht 6' (1.829 m)   Wt 79 kg   SpO2 94%   BMI 23.62 kg/m  Physical Exam  Constitutional: He is oriented to person, place, and time.  Deconditioned, frail  HENT:  Head: Normocephalic and atraumatic.  Eyes: Conjunctivae and EOM are normal.  No scleral icterus.  Neck: No tracheal deviation present.  Cardiovascular: Normal rate.  Murmur heard. Pulmonary/Chest: Effort normal. No respiratory distress.  Distant breath sounds  Abdominal: Soft. He exhibits no distension.  Musculoskeletal:        General: Normal range of motion.     Cervical back: Normal range of motion.  Neurological: He is alert and oriented to person, place, and time.      Diagnostic Studies & Laboratory data:     Echo: Large mobile aortic valve vegetation with severe aortic insufficiency.  Preserved LV and RV  function.  No abscess noted   I have independently reviewed the above radiologic studies and discussed with the patient   Recent Lab Findings: Lab Results  Component Value Date   WBC 10.5 07/25/2019   HGB 8.7 (L) 07/25/2019   HCT 30.1 (L) 07/25/2019   PLT 326 07/25/2019   GLUCOSE 110 (H) 07/25/2019   TRIG 98 01/24/2019   ALT 12 07/25/2019   AST 19 07/25/2019   NA 136 07/25/2019   K 3.7 07/25/2019   CL 98 07/25/2019   CREATININE 0.78 07/25/2019   BUN 5 (L) 07/25/2019   CO2 31 07/25/2019   INR 0.94 01/07/2015   HGBA1C 7.7 (H) 07/21/2019      Assessment / Plan:   64 year old male aortic valve insufficiency due to to aortic valve endocarditis.  He is currently growing Enterococcus blood.  He also has pulmonary fibrosis from his previous Covid infection.  There is no evidence of septic emboli, and due to his previous neurologic symptoms I wonder if he has had a septic stroke as well.  I discussed the risks and benefits of proceeding with surgery and have recommended that he undergo aortic valve replacement but he and his wife are hesitant given his current condition.  I further explained that given his lung function he is at high risk of prolonged ventilation and possible trach.  He is currently DNR, and this will need to be rescinded for 30 days if we are to proceed with surgery.  He and his wife would like some time to think about it.  Given his degree of aortic insufficiency, I explained that surgery is his only option, and further explained that if he were to refuse surgery he will he ultimately called heart failure.  I will touch base with them tomorrow to see if they have made a decision.    I  spent 40 minutes counseling the patient face to face.   Lajuana Matte 07/25/2019 2:22 PM

## 2019-07-25 NOTE — Interval H&P Note (Signed)
History and Physical Interval Note:  07/25/2019 7:50 AM  Nathan Perkins  has presented today for surgery, with the diagnosis of IDA.  The various methods of treatment have been discussed with the patient and family. After consideration of risks, benefits and other options for treatment, the patient has consented to  Procedure(s): TRANSESOPHAGEAL ECHOCARDIOGRAM (TEE) (N/A) ENTEROSCOPY (N/A) COLONOSCOPY WITH PROPOFOL (N/A) as a surgical intervention.  The patient's history has been reviewed, patient examined, no change in status, stable for surgery.  I have reviewed the patient's chart and labs.  Questions were answered to the patient's satisfaction.     [name] Harrell Gave

## 2019-07-25 NOTE — Progress Notes (Signed)
CBG 64. RN gave Dextrose 50% solution 12.5g IV. Made on coming RN aware and to recheck in 15 minutes.

## 2019-07-25 NOTE — Progress Notes (Signed)
Patient accidentally had a snack (couple bites of saltine crackers w/ chicken salad and a cup of apple sauce) prior to midnight. On call GI provider [place] paged and notified. Gave RN verbal order for 1 kit of MOVIPREP, to have patient complete prep until BM is clear for procedure in AM.

## 2019-07-25 NOTE — Op Note (Signed)
[facility] Patient Name: Nathan Perkins Procedure Date : 07/25/2019 MRN: [id number] Attending MD: Jackquline Denmark , MD Date of Birth: [birth year] CSN: [id number] Age: 64 Admit Type: Inpatient Procedure:                Small bowel enteroscopy Indications:              Iron deficiency anemia Providers:                Jackquline Denmark, MD, Jeanella Cara, RN, Theodora Blow, Technician Referring MD:              Medicines:                Monitored Anesthesia Care Complications:            No immediate complications. Estimated Blood Loss:     Estimated blood loss: none. Procedure:                Pre-Anesthesia Assessment:                           - Prior to the procedure, a History and Physical                            was performed, and patient medications and                            allergies were reviewed. The patient's tolerance of                            previous anesthesia was also reviewed. The risks                            and benefits of the procedure and the sedation                            options and risks were discussed with the patient.                            All questions were answered, and informed consent                            was obtained. Prior Anticoagulants: The patient has                            taken heparin, last dose was day of procedure. ASA                            Grade Assessment: III - A patient with severe                            systemic disease. After reviewing the risks and  benefits, the patient was deemed in satisfactory                            condition to undergo the procedure.                           After obtaining informed consent, the endoscope was                            passed under direct vision. Throughout the                            procedure, the patient's blood pressure, pulse, and                            oxygen saturations were  monitored continuously. The                            [id number]) Olympus pediatric colonoscope                            was introduced through the mouth and advanced to                            the small bowel at the Ligament of [place]. The                            small bowel enteroscopy was accomplished without                            difficulty. The patient tolerated the procedure                            well. Scope In: Scope Out: Findings:      The examined esophagus was normal with well-defined Z-line at 40 cm.       Examined by NBI.      6-8 2 to 4 mm sessile polyps with no bleeding and no stigmata of recent       bleeding were found in the gastric antrum. 2 polyps were removed using       cold forceps and sent for histology. Some retained food in the stomach       without outlet obstruction.      There was no evidence of significant pathology in the entire examined       duodenum. Incidental note was made of a small 4 mm diverticula in the       second portion of the duodenum. Biopsies for histology were taken with a       cold forceps for evaluation of celiac disease.      There was no evidence of significant pathology in the entire examined       portion of jejunum. Impression:               - A few gastric polyps s/p polypectomy x 2.                           -  Otherwise normal enteroscopy.                           - No evidence of upper GI bleeding. Recommendation:           - Return patient to hospital ward for ongoing care.                           - Await pathology results.                           - Continue present medications.                           - Proceed with colonoscopy.                           - The findings and recommendations were discussed                            with the patient. Procedure Code(s):        --- Professional ---                           [phone], Small intestinal endoscopy, enteroscopy                             beyond second portion of duodenum, not including                            ileum; with biopsy, single or multiple Diagnosis Code(s):        --- Professional ---                           K31.7, Polyp of stomach and duodenum                           D50.9, Iron deficiency anemia, unspecified CPT copyright 2019 [employer]. All rights reserved. The codes documented in this report are preliminary and upon coder review may  be revised to meet current compliance requirements. Jackquline Denmark, MD 07/25/2019 9:25:25 AM This report has been signed electronically. Number of Addenda: 0

## 2019-07-26 ENCOUNTER — Inpatient Hospital Stay: Payer: Self-pay

## 2019-07-26 DIAGNOSIS — I358 Other nonrheumatic aortic valve disorders: Secondary | ICD-10-CM

## 2019-07-26 DIAGNOSIS — Z66 Do not resuscitate: Secondary | ICD-10-CM

## 2019-07-26 DIAGNOSIS — R0600 Dyspnea, unspecified: Secondary | ICD-10-CM

## 2019-07-26 DIAGNOSIS — J849 Interstitial pulmonary disease, unspecified: Secondary | ICD-10-CM

## 2019-07-26 DIAGNOSIS — R0609 Other forms of dyspnea: Secondary | ICD-10-CM

## 2019-07-26 DIAGNOSIS — Z515 Encounter for palliative care: Secondary | ICD-10-CM

## 2019-07-26 LAB — GLUCOSE, CAPILLARY
Glucose-Capillary: 397 mg/dL — ABNORMAL HIGH (ref 70–99)
Glucose-Capillary: 433 mg/dL — ABNORMAL HIGH (ref 70–99)

## 2019-07-26 LAB — SURGICAL PATHOLOGY

## 2019-07-26 MED ORDER — INSULIN ASPART 100 UNIT/ML ~~LOC~~ SOLN
5.0000 [IU] | Freq: Once | SUBCUTANEOUS | Status: AC
  Administered 2019-07-26: 5 [IU] via SUBCUTANEOUS

## 2019-07-26 MED ORDER — SALINE SPRAY 0.65 % NA SOLN
1.0000 | NASAL | Status: DC | PRN
  Administered 2019-07-26: 1 via NASAL
  Filled 2019-07-26: qty 44

## 2019-07-26 MED ORDER — DEXTROSE 50 % IV SOLN: 25.0000 g | INTRAVENOUS | Status: DC

## 2019-07-26 MED ORDER — HYDROMORPHONE BOLUS VIA INFUSION
0.2000 mg | INTRAVENOUS | Status: DC | PRN
  Administered 2019-07-27: 0.2 mg via INTRAVENOUS
  Filled 2019-07-26: qty 1

## 2019-07-26 MED ORDER — INSULIN ASPART 100 UNIT/ML ~~LOC~~ SOLN
0.0000 [IU] | Freq: Three times a day (TID) | SUBCUTANEOUS | Status: DC
  Administered 2019-07-27: 5 [IU] via SUBCUTANEOUS
  Administered 2019-07-27: 9 [IU] via SUBCUTANEOUS

## 2019-07-26 MED ORDER — CHLORHEXIDINE GLUCONATE CLOTH 2 % EX PADS: 6.0000 | MEDICATED_PAD | Freq: Every day | CUTANEOUS | Status: DC

## 2019-07-26 MED ORDER — INSULIN ASPART 100 UNIT/ML ~~LOC~~ SOLN: 0.0000 [IU] | Freq: Every day | SUBCUTANEOUS | Status: DC

## 2019-07-26 MED ORDER — SODIUM CHLORIDE 0.9% FLUSH: 10.0000 mL | Freq: Two times a day (BID) | INTRAVENOUS | Status: DC

## 2019-07-26 MED ORDER — SODIUM CHLORIDE 0.9% FLUSH
10.0000 mL | INTRAVENOUS | Status: DC | PRN
  Administered 2019-07-27: 10 mL

## 2019-07-26 NOTE — Progress Notes (Signed)
Received call from patient's wife that she is concerned as insulin has been stopped and he often feels worse when his sugar is out of control.  Plan is for discharge home tomorrow with hospice services.  Will restart low dose sliding scale (he was on previously this admission) for coverage until he discharges.  Micheline Rough, MD [facility] Team [phone]  NO CHARGE NOTE

## 2019-07-26 NOTE — Consult Note (Signed)
Consultation Note Date: 07/26/2019   Patient Name: Nathan Perkins  DOB: [birth year]  MRN: [id number]  Age / Sex: 64 y.o., male  PCP: Monico Blitz, MD Referring Physician: Geradine Girt, DO  Reason for Consultation: Establishing goals of care, Hospice Evaluation, Pain control and Psychosocial/spiritual support  HPI/Patient Profile: 64 y.o. 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   Patient states he has had progressive decline since hospitalization for Covid 19 pneumonia in October 2020.  At that time he was treated with IV steroids, remdesivir, Actemra, and convalescent plasma.  He developed post Covid ILD and has been using 2 L supplemental O2 via [place] as an outpatient.  He has had a 40 pound weight loss since that time.  Patient reports continued physical and functional decline since October 2020 status post Covid  On 05/05/2019 he had an MRI of his head for evaluation of headaches and arm numbness which showed suspected subacute small hemorrhagic infarct in the left occipital lobe.  Work up in the hospital did show that his blood culture was positive for enterococcus faecalis.  Oncology was consulted and Dr. Marin Olp is concerned that he may have an underlying malignancy.    The patient's echocardiogram was suspicious for an aortic valve vegetation.  His blood work does demonstrate a progressive microcytic anemia.    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    Cardiology to discuss the risks and benefits of surgery and possible aortic  valve replacement but patient is high risk given his current situation.  It was further discussed that given his poor lung function he is at high risk for prolonged ventilation and possible trach.  Patient  understands the seriousness of his current medical situation.  He understands the limited viable options available to him to prolong quality of life.  Patient has  made decisions that he chooses at this time to shift focus of care to comfort, quality and dignity versus attempts at life prolonging measures.  He verbalizes a sense of peace  with his decision and hopes to go home with hospice services asap.     Clinical Assessment and Goals of Care:  This NP Wadie Lessen reviewed medical records, received report from team, assessed the patient and then meet at the patient's bedside along with his wife/Nathan Perkins  to discuss diagnosis, prognosis, GOC, EOL wishes disposition and options.  Concept of Hospice and Palliative Care were discussed  Education was offered  today regarding advanced directives.  Concepts specific to code status, artifical feeding and hydration, continued IV antibiotics and rehospitalization was had.  The difference between a aggressive medical intervention path  and a palliative comfort care path for this patient at this time was had.  Values and goals of care important to patient and family were attempted to be elicited.  Patient verbalizes a clear understanding of his current medical situation, limited viable treatment options and his poor prognosis   MOST form introduced.  Education regarding the natural trajectory and expectations at EOL  Natural trajectory and expectations at EOL were discussed.  Questions and concerns addressed.   Family encouraged to call with questions or concerns.    PMT will continue to support holistically.       SUMMARY OF RECOMMENDATIONS    Code Status/Advance Care Planning:  DNR   Comfort and dignity are focus of care/ no further  diagnostics, surgery or interventions desired  Discharge home with hospice / [place]    Symptom Management:   Dilauid continuous gtt initialed yesterday- Dilaudid 0.5 mg IV/hr with bolus for breakthrough symptoms  PICC for long term med management at home  Palliative Prophylaxis:   Bowel Regimen and Frequent Pain Assessment  Additional Recommendations (Limitations, Scope, Preferences):  Full Comfort Care  Psycho-social/Spiritual:   Desire for further Chaplaincy support:yes    Additional Recommendations: Education on Hospice and Grief/Bereavement Support   Created space and opportunity for patient and his wife to explore their thoughts and feelings regarding current medical situation and the long-term poor prognosis.  Both verbalize a sense of peace with  decision to focus on comfort and dignity at this point in time.  Both expressed strong faith foundation in relationship with God.  Prognosis:   < 3 months  Discharge Planning: Home with Hospice      Primary Diagnoses: Present on Admission: . 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 ILD (interstitial lung disease) ([place]) . Hypertension associated with diabetes ([place]) . Microcytic anemia . Chronic respiratory failure ([place]) . Bacteremia due to Enterococcus . Splenic infarct . Renal infarct [facility])   I have reviewed the medical record, interviewed the patient and family, and examined the patient. The following aspects are pertinent.  Past Medical History:  Diagnosis Date  . Bacteremia   . Cancer ([place])    skin cancer face- basal  . Chronic respiratory failure ([place])    due to IDL from covid  . Complication of anesthesia   . COVID-19 01/2019  . Diabetes ([place])    type 2 x 5 yrs  . Hypertension    since age 58  . PONV (postoperative nausea and vomiting) 06/25/2017  . Small vessel thrombosis of skin   . Stroke [facility])    Social History   Socioeconomic History  . Marital status: Married    Spouse name:  Not on file  . Number of children: Not on file  . Years of education: Not on file  . Highest education level: Not on file  Occupational History  . Not on file  Tobacco Use  . Smoking status: Former Smoker    Packs/day: 2.00    Years: 32.00    Pack years: 64.00    Types: Cigarettes    Start date: 01/02/1969    Quit date: 07/03/1999    Years since quitting: 20.0  . Smokeless tobacco: Former Systems developer    Types: Chew  Substance and Sexual Activity  . Alcohol use: No    Alcohol/week: 0.0 standard drinks  . Drug use: No  . Sexual activity: Not on file  Other Topics Concern  . Not on file  Social History Narrative  . Not on file   Social Determinants of Health   Financial Resource Strain:   . Difficulty of  Paying Living Expenses:   Food Insecurity:   . Worried About Charity fundraiser in the Last Year:   . Arboriculturist in the Last Year:   Transportation Needs:   . Film/video editor (Medical):   [name] Kitchen Lack of Transportation (Non-Medical):   Physical Activity:   . Days of Exercise per Week:   . Minutes of Exercise per Session:   Stress:   . Feeling of Stress :   Social Connections:   . Frequency of Communication with Friends and Family:   . Frequency of Social Gatherings with Friends and Family:   . Attends Religious Services:   . Active Member of Clubs or Organizations:   . Attends Archivist Meetings:   [name] Kitchen Marital Status:    History reviewed. No pertinent family history. Scheduled Meds: . metoprolol tartrate  5 mg Intravenous Once   Continuous Infusions: . HYDROmorphone 0.5 mg/hr (07/25/19 1634)   PRN Meds:.guaiFENesin, LORazepam Medications Prior to Admission:  Prior to Admission medications   Medication Sig Start Date End Date Taking? Authorizing Provider  amLODipine (NORVASC) 5 MG tablet Take 5 mg by mouth daily.   Yes [provider]  aspirin EC 81 MG tablet Take 81 mg by mouth daily.   Yes [provider]  benzonatate (TESSALON)  200 MG capsule Take 1 capsule (200 mg total) by mouth 3 (three) times daily as needed for cough. 06/19/19  Yes Lauraine Rinne, NP  ferrous sulfate 325 (65 FE) MG tablet Take 325 mg by mouth daily with breakfast.   Yes [provider]  Flaxseed, Linseed, (FLAXSEED OIL) 1200 MG CAPS Take 1 capsule by mouth daily.    Yes [provider]  HUMALOG KWIKPEN 100 UNIT/ML KiwkPen Inject 4-10 Units into the muscle as needed (Sliding scale).  07/26/17  Yes [provider]  Insulin Syringe-Needle U-100 25G X 1" 1 ML MISC For 4 times a day insulin SQ, 1 month supply. Diagnosis E11.65 Patient taking differently: 1 each by Other route See admin instructions. For 4 times a day insulin SQ, 1 month supply. Diagnosis E11.65 02/07/19  Yes Thurnell Lose, MD  lisinopril (PRINIVIL,ZESTRIL) 40 MG tablet Take 40 mg by mouth daily.  12/13/14  Yes [provider]  methocarbamol (ROBAXIN) 500 MG tablet Take 500 mg by mouth 2 (two) times daily. 07/17/19  Yes [provider]  NOVOLIN N RELION 100 UNIT/ML injection Inject 30-40 Units into the skin See admin instructions. Per sliding scale depends on blood sugar levels 11/06/14  Yes [provider]  ondansetron (ZOFRAN) 4 MG tablet Take 4 mg by mouth every 8 (eight) hours as needed for nausea/vomiting. 06/16/19  Yes [provider]  pantoprazole (PROTONIX) 40 MG tablet Take 40 mg by mouth daily.   Yes [provider]  Vitamin D, Ergocalciferol, (DRISDOL) 50000 UNITS CAPS capsule Take 1 capsule by mouth every Sunday.  12/11/14  Yes [provider]  chlorpheniramine-HYDROcodone (TUSSIONEX PENNKINETIC ER) 10-8 MG/5ML SUER Take 5 mLs by mouth at bedtime as needed for cough. Patient not taking: Reported on 07/20/2019 06/27/19   Lauraine Rinne, NP   Allergies  Allergen Reactions  . Invokana [Canagliflozin] Other (See Comments)    KETOACIDOSIS   . Percocet [Oxycodone-Acetaminophen] Itching  . Shellfish Allergy      Shrimp - possible Iodine allergy UNSPECIFIED REACTION   . Sulfa Antibiotics     UNSPECIFIED REACTION    Review of Systems  Constitutional: Positive for fatigue and  unexpected weight change.  Respiratory: Positive for shortness of breath.   Gastrointestinal: Positive for abdominal pain.  Neurological: Positive for weakness.    Physical Exam  Vital Signs: BP 140/85 (BP Location: Right Arm)   Pulse (!) 115   Temp 98.8 F (37.1 C) (Oral)   Resp (!) 30   Ht 6' (1.829 m)   Wt 79 kg   SpO2 100%   BMI 23.62 kg/m  Pain Scale: 0-10 POSS *See Group Information*: 1-Acceptable,Awake and alert Pain Score: Asleep   SpO2: SpO2: 100 % O2 Device:SpO2: 100 % O2 Flow Rate: .O2 Flow Rate (L/min): 4.5 L/min  IO: Intake/output summary:   Intake/Output Summary (Last 24 hours) at 07/26/2019 1014 Last data filed at 07/26/2019 [id number] Gross per 24 hour  Intake 370.43 ml  Output 450 ml  Net -79.57 ml    LBM: Last BM Date: 07/24/19 Baseline Weight: Weight: 73.5 kg Most recent weight: Weight: 79 kg     Palliative Assessment/Data:   Discussed with Dr Eliseo Squires  Time In: 0850 Time Out: 1000 Time Total: 70 minutes Greater than 50%  of this time was spent counseling and coordinating care related to the above assessment and plan.  Signed by: Wadie Lessen, NP   Please contact Palliative Medicine Team phone at [phone] for questions and concerns.  For individual provider: See Shea Evans

## 2019-07-26 NOTE — Plan of Care (Signed)
  Problem: Nutrition: Goal: Adequate nutrition will be maintained Outcome: Progressing   

## 2019-07-26 NOTE — Progress Notes (Signed)
Palliative Medicine RN Note: Rec'd request from PMT NP Wadie Lessen to assist with referral to [facility] (patient request). He will need to go home on Dilaudid continuous infusion.   I called their office; intake coordinator Vito Backers is on another line and will have to call me back. I faxed over clinical information at their request.  Marjie Skiff. Asher Torpey, RN, BSN, [facility] Palliative Medicine Team 07/26/2019 10:33 AM Office [phone]

## 2019-07-26 NOTE — Progress Notes (Addendum)
  Progress Note    07/26/2019 7:46 AM 1 Day Post-Op  Subjective:  Wife present on exam.  Patient is interested in comfort care currently and they are waiting to speak with palliative care.  Abd pain comes and goes but no recognizable pattern related to meals.   Vitals:   07/25/19 1508 07/25/19 1927  BP: (!) 172/80 140/85  Pulse:  (!) 115  Resp: (!) 30   Temp: 99.3 F (37.4 C) 98.8 F (37.1 C)  SpO2: (!) 87% 100%   Physical Exam: Lungs:  Non labored with O2 by [place] Extremities:  Moving all extremities well Abdomen:  Generalized tenderness to deep palpation, no distention Neurologic: A&O  CBC    Component Value Date/Time   WBC 10.5 07/25/2019 0334   RBC 4.24 07/25/2019 0334   HGB 8.7 (L) 07/25/2019 0334   HCT 30.1 (L) 07/25/2019 0334   PLT 326 07/25/2019 0334   MCV 71.0 (L) 07/25/2019 0334   MCH 20.5 (L) 07/25/2019 0334   MCHC 28.9 (L) 07/25/2019 0334   RDW 17.5 (H) 07/25/2019 0334   LYMPHSABS 1.2 06/19/2019 1523   MONOABS 0.6 06/19/2019 1523   EOSABS 0.0 06/19/2019 1523   BASOSABS 0.0 06/19/2019 1523    BMET    Component Value Date/Time   NA 136 07/25/2019 0334   K 3.7 07/25/2019 0334   CL 98 07/25/2019 0334   CO2 31 07/25/2019 0334   GLUCOSE 110 (H) 07/25/2019 0334   BUN 5 (L) 07/25/2019 0334   CREATININE 0.78 07/25/2019 0334   CALCIUM 8.5 (L) 07/25/2019 0334   GFRNONAA >60 07/25/2019 0334   GFRAA >60 07/25/2019 0334    INR    Component Value Date/Time   INR 0.94 01/07/2015 0746     Intake/Output Summary (Last 24 hours) at 07/26/2019 0746 Last data filed at 07/26/2019 [id number] Gross per 24 hour  Intake 1070.43 ml  Output 450 ml  Net 620.43 ml     Assessment/Plan:  64 y.o. 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 I agree with the PA note as documented above.  Discussed with patient and wife again the complex nature of his problem.  Again the SMA embolus is very distal at branch level disease and would be difficult to treat both endovascular and open.  Also unclear to me if this is chronic given he had abdominal pain since December this ha been intermittent.  Overall he denies any postprandial pain and is not eating because he states food smells bad after Covid.  Discussed that I would recommend a repeat CTA abdomen pelvis today to ensure that his SMA embolus has not degenerated the artery given concern for mycotic process.  However patient and his wife are interested in hospice palliative care discussion.  Discussed that would delay CT if that is the route they would plan.  CT surgery also evaluating patient for TEE findings.  Marty Heck, MD [facility] [place] Office: [phone]

## 2019-07-26 NOTE — Progress Notes (Signed)
Palliative Medicine RN Note: Spoke w Cassandra with [facility], who confirmed receipt of clinicals. She will check on availability of Dilaudid, as there have been shortages lately, but they are otherwise prepared to admit him with a tentative hospital d/c date of 4/22.   Marjie Skiff Makhya Arave, RN, BSN, [facility] Palliative Medicine Team 07/26/2019 11:47 AM Office [phone]

## 2019-07-26 NOTE — Progress Notes (Signed)
Progress Note    Nathan Perkins  [id number] DOB: [birth year]  DOA: 07/20/2019 PCP: Monico Blitz, MD    Brief Narrative:     Medical records reviewed and are as summarized below:  Nathan Perkins is an 64 y.o. 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 Patient states he has had progressive decline since hospitalization for Covid 19 pneumonia in October 2020.  At that time he was treated with IV steroids, remdesivir, Actemra, and convalescent plasma.  He developed post Covid ILD and has been using 2 L supplemental O2 via [place] as an outpatient.  He has had a 40 pound weight loss since that time On 05/05/2019 he had an MRI of his head for evaluation of headaches and arm numbness which showed suspected subacute small hemorrhagic infarct in the left occipital lobe. Over the last several weeks he has been having intermittent severe epigastric to left upper quadrant abdominal pain.  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 -CVTS consult/vascular consult Status  post TEE: Aortic valve endocarditis, with severe sequelae. Severe AI, all three leaflets thickened, cusp fenestrations present, and highly mobile >3.5 cm mass that moves between LVOT and Aorta with systole and diastole. High risk of embolism and likely source of septic emboli. Recommend CT surgery evaluation. -Per chart review it appears that patient had discussion with Dr. Marylyn Ishihara about becoming comfort care as he does not wish to undergo surgery -Dilaudid drip started 4/20 by Dr. Marylyn Ishihara -Palliative care consult appreciated and will defer further adjustment of Dilaudid to them  Anemia.  Iron deficiency anemia.   -Status post colonoscopy on 4/20.   -Revealing a couple polyps otherwise within the limits of normal.     Diabetes type II  -Apparently his sliding scale and other insulin was DC'd when he became comfort care yesterday afternoon by Dr. Marylyn Ishihara  Chronic respiratory failure due to hypoxia secondary to post Covid interstitial lung disease.   -Patient is on home oxygen at 2 L.   -Currently oxygen saturation level greater than 90% on 2 L  Family Communication/Anticipated D/C date and plan/Code Status   DVT prophylaxis: Heparin DC'd due to comfort status Code Status: DNR Family Communication: Wife at bedside Disposition Plan: Status is: Inpatient  Remains inpatient appropriate because:Ongoing active pain requiring inpatient pain management   Dispo: The patient is from: Home              Anticipated d/c is to: Home              Anticipated d/c date is: 1 day  Patient currently is medically stable to d/c.  Once hospice and Dilaudid drip has been set up for home          Medical Consultants:    Vascular  CVTS  Palliative care  Cardiology for TEE  GI    Subjective:   Patient and wife continue to want to pursue comfort measures  Objective:    Vitals:   07/25/19 1301 07/25/19 1459 07/25/19 1508 07/25/19 1927  BP:  (!) 177/87 (!) 172/80 140/85  Pulse:   (!) 137  (!) 115  Resp: 20 (!) 23 (!) 30   Temp:   99.3 F (37.4 C) 98.8 F (37.1 C)  TempSrc:   Oral Oral  SpO2: 94% 92% (!) 87% 100%  Weight:      Height:        Intake/Output Summary (Last 24 hours) at 07/26/2019 1029 Last data filed at 07/26/2019 [id number] Gross per 24 hour  Intake 370.43 ml  Output 450 ml  Net -79.57 ml   Filed Weights   07/22/19 2203 07/23/19 2125 07/25/19 0727  Weight: 76.6 kg 79 kg 79 kg    Exam: In bed, wife at bedside Regular rate and rhythm Appropriate No acute distress  Data Reviewed:   I have personally reviewed following labs and imaging studies:  Labs: Labs show the following:   Basic Metabolic Panel: Recent Labs  Lab 07/20/19 1448 07/20/19 1448 07/21/19 0236 07/21/19 0236 07/22/19 0230 07/25/19 0334  NA 132*  --  132*  --  130* 136  K 4.4   < > 4.8   < > 5.3* 3.7  CL 97*  --  98  --  96* 98  CO2 28  --  27  --  23 31  GLUCOSE 157*  --  241*  --  459* 110*  BUN 16  --  16  --  19 5*  CREATININE 0.97  --  1.03  --  0.97 0.78  CALCIUM 8.7*  --  8.3*  --  8.1* 8.5*  MG  --   --   --   --   --  1.8  PHOS  --   --   --   --   --  4.2   < > = values in this interval not displayed.   GFR Estimated Creatinine Clearance: 103.7 mL/min (by C-G formula based on SCr of 0.78 mg/dL). Liver Function Tests: Recent Labs  Lab 07/20/19 1448 07/25/19 0334  AST 15 19  ALT 10 12  ALKPHOS 47 54  BILITOT 0.8 0.4  PROT 7.0 6.1*  ALBUMIN 2.6* 2.1*   No results for input(s): LIPASE, AMYLASE in the last 168 hours. No results for input(s): AMMONIA in the last 168 hours. Coagulation profile No results for input(s): INR, PROTIME in the last 168 hours.  CBC: Recent Labs  Lab 07/21/19 1641 07/22/19 0230 07/23/19 0524 07/24/19 0533 07/25/19 0334  WBC 5.0 5.5 4.6 6.8 10.5  HGB 8.0* 8.4* 8.4* 8.2* 8.7*  HCT 26.9* 28.7* 29.0* 28.2* 30.1*  MCV 69.7* 70.7* 70.4* 70.5* 71.0*  PLT 174 191 253 255 326   Cardiac Enzymes: No results for input(s):  CKTOTAL, CKMB, CKMBINDEX, TROPONINI in the last 168 hours. BNP (last 3 results) No results for input(s): PROBNP in the last 8760 hours. CBG: Recent Labs  Lab 07/25/19 0706 07/25/19 0744 07/25/19 0957 07/25/19 2109 07/25/19 2322  GLUCAP 63* 105* 105* 229* 236*   D-Dimer: No results for input(s): DDIMER in the last 72  hours. Hgb A1c: No results for input(s): HGBA1C in the last 72 hours. Lipid Profile: No results for input(s): CHOL, HDL, LDLCALC, TRIG, CHOLHDL, LDLDIRECT in the last 72 hours. Thyroid function studies: No results for input(s): TSH, T4TOTAL, T3FREE, THYROIDAB in the last 72 hours.  Invalid input(s): FREET3 Anemia work up: No results for input(s): VITAMINB12, FOLATE, FERRITIN, TIBC, IRON, RETICCTPCT in the last 72 hours. Sepsis Labs: Recent Labs  Lab 07/22/19 0230 07/23/19 0524 07/24/19 0533 07/25/19 0334  WBC 5.5 4.6 6.8 10.5    Microbiology Recent Results (from the past 240 hour(s))  SARS CORONAVIRUS 2 (TAT 6-24 HRS) Nasopharyngeal Nasopharyngeal Swab     Status: None   Collection Time: 07/20/19  5:49 PM   Specimen: Nasopharyngeal Swab  Result Value Ref Range Status   SARS Coronavirus 2 NEGATIVE NEGATIVE Final    Comment: (NOTE) SARS-CoV-2 target nucleic acids are NOT DETECTED. The SARS-CoV-2 RNA is generally detectable in upper and lower respiratory specimens during the acute phase of infection. Negative results do not preclude SARS-CoV-2 infection, do not rule out co-infections with other pathogens, and should not be used as the sole basis for treatment or other patient management decisions. Negative results must be combined with clinical observations, patient history, and epidemiological information. The expected result is Negative. Fact Sheet for Patients: SugarRoll.be Fact Sheet for Healthcare Providers: https://www.woods-mathews.com/ This test is not yet approved or cleared by the [place] FDA and    has been authorized for detection and/or diagnosis of SARS-CoV-2 by FDA under an Emergency Use Authorization (EUA). This EUA will remain  in effect (meaning this test can be used) for the duration of the COVID-19 declaration under Section 56 4(b)(1) of the Act, 21 U.S.C. section 360bbb-3(b)(1), unless the authorization is terminated or revoked sooner. Performed at [facility], [place] [address], [place], [place] [address]   Blood culture (routine x 2)     Status: None (Preliminary result)   Collection Time: 07/20/19  6:39 PM   Specimen: BLOOD  Result Value Ref Range Status   Specimen Description BLOOD LEFT ANTECUBITAL  Final   Special Requests   Final    BOTTLES DRAWN AEROBIC AND ANAEROBIC Blood Culture results may not be optimal due to an inadequate volume of blood received in culture bottles   Culture  Setup Time   Final    IN BOTH AEROBIC AND ANAEROBIC BOTTLES GRAM POSITIVE COCCI IN CHAINS CRITICAL VALUE NOTED.  VALUE IS CONSISTENT WITH PREVIOUSLY REPORTED AND CALLED VALUE.    Culture   Final    GRAM POSITIVE COCCI TOO YOUNG TO READ Performed at [facility], [place] [address] [place],  [address]    Report Status PENDING  Incomplete  Blood culture (routine x 2)     Status: Abnormal (Preliminary result)   Collection Time: 07/20/19  6:39 PM   Specimen: BLOOD  Result Value Ref Range Status   Specimen Description BLOOD RIGHT ANTECUBITAL  Final   Special Requests   Final    BOTTLES DRAWN AEROBIC AND ANAEROBIC Blood Culture results may not be optimal due to an inadequate volume of blood received in culture bottles   Culture  Setup Time   Final    GRAM POSITIVE COCCI IN CHAINS IN BOTH AEROBIC AND ANAEROBIC BOTTLES Organism ID to follow CRITICAL RESULT CALLED TO, READ BACK BY AND VERIFIED WITHHughie Closs PHARMD [id number] 07/21/19 A BROWNING    Culture (A)  Final    ENTEROCOCCUS FAECALIS SUSCEPTIBILITIES PERFORMED ON  PREVIOUS CULTURE WITHIN THE LAST 5 DAYS. Performed at  [facility], [place] [address], [place], [place] [address]    Report Status PENDING  Incomplete   Organism ID, Bacteria ENTEROCOCCUS FAECALIS  Final      Susceptibility   Enterococcus faecalis - MIC*    AMPICILLIN <=2 SENSITIVE Sensitive     VANCOMYCIN 1 SENSITIVE Sensitive     * ENTEROCOCCUS FAECALIS  Blood Culture ID Panel (Reflexed)     Status: Abnormal   Collection Time: 07/20/19  6:39 PM  Result Value Ref Range Status   Enterococcus species DETECTED (A) NOT DETECTED Final    Comment: CRITICAL RESULT CALLED TO, READ BACK BY AND VERIFIED WITH: [name] PHARMD [id number] 07/21/19 A BROWNING    Vancomycin resistance NOT DETECTED NOT DETECTED Final   Listeria monocytogenes NOT DETECTED NOT DETECTED Final   Staphylococcus species NOT DETECTED NOT DETECTED Final   Staphylococcus aureus (BCID) NOT DETECTED NOT DETECTED Final   Streptococcus species NOT DETECTED NOT DETECTED Final   Streptococcus agalactiae NOT DETECTED NOT DETECTED Final   Streptococcus pneumoniae NOT DETECTED NOT DETECTED Final   Streptococcus pyogenes NOT DETECTED NOT DETECTED Final   Acinetobacter baumannii NOT DETECTED NOT DETECTED Final   Enterobacteriaceae species NOT DETECTED NOT DETECTED Final   Enterobacter cloacae complex NOT DETECTED NOT DETECTED Final   Escherichia coli NOT DETECTED NOT DETECTED Final   Klebsiella oxytoca NOT DETECTED NOT DETECTED Final   Klebsiella pneumoniae NOT DETECTED NOT DETECTED Final   Proteus species NOT DETECTED NOT DETECTED Final   Serratia marcescens NOT DETECTED NOT DETECTED Final   Haemophilus influenzae NOT DETECTED NOT DETECTED Final   Neisseria meningitidis NOT DETECTED NOT DETECTED Final   Pseudomonas aeruginosa NOT DETECTED NOT DETECTED Final   Candida albicans NOT DETECTED NOT DETECTED Final   Candida glabrata NOT DETECTED NOT DETECTED Final   Candida krusei NOT DETECTED NOT DETECTED Final   Candida parapsilosis NOT DETECTED NOT DETECTED Final   Candida tropicalis NOT  DETECTED NOT DETECTED Final    Comment: Performed at [facility], [place]. [address] [place], [place] [address]  Culture, blood (routine x 2)     Status: None (Preliminary result)   Collection Time: 07/22/19 10:27 AM   Specimen: BLOOD  Result Value Ref Range Status   Specimen Description BLOOD RIGHT ANTECUBITAL  Final   Special Requests   Final    BOTTLES DRAWN AEROBIC AND ANAEROBIC Blood Culture adequate volume   Culture   Final    NO GROWTH 3 DAYS Performed at [facility], [place] [address] [place], [place] [address]    Report Status PENDING  Incomplete  Culture, blood (routine x 2)     Status: None (Preliminary result)   Collection Time: 07/22/19 10:33 AM   Specimen: BLOOD RIGHT HAND  Result Value Ref Range Status   Specimen Description BLOOD RIGHT HAND  Final   Special Requests   Final    BOTTLES DRAWN AEROBIC AND ANAEROBIC Blood Culture adequate volume   Culture   Final    NO GROWTH 3 DAYS Performed at [facility], [place] [address], [place], [place] [address]    Report Status PENDING  Incomplete    Procedures and diagnostic studies:  DG CHEST PORT 1 VIEW  Result Date: 07/25/2019 CLINICAL DATA:  64 year old male with shortness of breath. EXAM: PORTABLE CHEST 1 VIEW COMPARISON:  Chest radiograph dated 06/19/2019. FINDINGS: There is diffuse airspace opacity in the left lung  primarily involving the left mid to lower lung field new since the prior radiograph most consistent with pneumonia. Atypical edema is considered less likely. Clinical correlation and follow-up to resolution recommended. There is probable small left pleural effusion. The right lung remains clear. No pneumothorax. Stable cardiac silhouette. No acute osseous pathology. Lower cervical ACDF. IMPRESSION: Left lung airspace opacity most consistent with pneumonia. Clinical correlation and follow-up to resolution recommended. Electronically Signed   By: Anner Crete M.D.   On: 07/25/2019 17:08   ECHO  TEE  Result Date: 07/25/2019    TRANSESOPHOGEAL ECHO REPORT   Patient Name:   Nathan Perkins Date of Exam: 07/25/2019 Medical Rec #:  [id number]          Height:       72.0 in Accession #:    [id number]         Weight:       174.2 lb Date of Birth:  Jan 20, 1956          BSA:          2.009 m Patient Age:    53 years           BP:           173/74 mmHg Patient Gender: M                  HR:           88 bpm. Exam Location:  Inpatient Procedure: 3D Echo, Transesophageal Echo, Color Doppler and Cardiac Doppler Indications:     Endocarditis  History:         Patient has prior history of Echocardiogram examinations, most                  recent 07/21/2019. Aortic Valve Disease and Endocarditis,                  Signs/Symptoms:Bacteremia; Risk Factors:Diabetes.  Sonographer:     Roseanna Rainbow RDCS Referring Phys:  [id number] Pump Back Diagnosing Phys: Buford Dresser MD PROCEDURE: After discussion of the risks and benefits of a TEE, an informed consent was obtained from the patient. The transesophogeal probe was passed without difficulty through the esophogus of the patient. Imaged were obtained with the patient in a left lateral decubitus position. Sedation performed by different physician. The patient was monitored while under deep sedation. Anesthestetic sedation was provided intravenously by Anesthesiology: 430mg  of Propofol, 40mg  of Lidocaine. Image quality was excellent. The patient's vital signs; including heart rate, blood pressure, and oxygen saturation; remained stable throughout the procedure. The patient developed no complications during the procedure. IMPRESSIONS  1. Left ventricular ejection fraction, by estimation, is 60 to 65%. The left ventricle has normal function. The left ventricle has no regional wall motion abnormalities.  2. Right ventricular systolic function is normal. The right ventricular size is normal.  3. No left atrial/left atrial appendage thrombus was detected.  4. There is a small  thickened, slightly mobile mass on the posterior leaflet concerning for small vegetation. The mitral valve is abnormal. Trivial mitral valve regurgitation.  5. Trileaflet. Severe aortic regurgitation, filling LVOT and pressure half time 150 msec. 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 There are fenestrations present in the cusps. There is a highly mobild >3.5 cm vegetation that moves between the LVOT side and aorta side with systole and diastole. High suspicion that this is the source of septic emboli.. The aortic valve is abnormal. Aortic valve regurgitation is severe. Aortic  regurgitation PHT measures 156 msec.  6. There is mild (Grade II) plaque involving the descending aorta. Conclusion(s)/Recommendation(s): Findings are concerning for vegetation/infective endocarditis as detailed above. Aortic valve endocarditis, with severe sequelae. Severe AI, all three leaflets thickened, cusp fenestrations present, and highly mobile >3.5  cm mass that moves between LVOT and Aorta with systole and diastole. High risk of embolism and likely source of septic emboli. Recommend CT surgery evaluation. FINDINGS  Left Ventricle: Left ventricular ejection fraction, by estimation, is 60 to 65%. The left ventricle has normal function. The left ventricle has no regional wall motion abnormalities. The left ventricular internal cavity size was normal in size. There is  no left ventricular hypertrophy. Right Ventricle: The right ventricular size is normal. No increase in right ventricular wall thickness. Right ventricular systolic function is normal. Left Atrium: Left atrial size was not well visualized. No left atrial/left atrial appendage thrombus was detected. Right Atrium: Right atrial size was not well visualized. Pericardium: There is no evidence of pericardial effusion. Mitral Valve: There is a small thickened, slightly mobile mass on the posterior leaflet concerning for small vegetation. The mitral valve is  abnormal. Trivial mitral valve regurgitation. Tricuspid Valve: The tricuspid valve is normal in structure. Tricuspid valve regurgitation is trivial. No evidence of tricuspid stenosis. There is no evidence of tricuspid valve vegetation. Aortic Valve: Trileaflet. Severe aortic regurgitation, filling LVOT and pressure half time 150 msec. 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 There are fenestrations present in the cusps. There is a highly mobild >3.5 cm vegetation that moves between the LVOT side and aorta side with systole and diastole. High suspicion that this is the source of septic emboli. The aortic valve is abnormal. Aortic valve regurgitation is severe. Aortic regurgitation PHT measures 156 msec. Pulmonic Valve: The pulmonic valve was grossly normal. Pulmonic valve regurgitation is trivial. No evidence of pulmonic stenosis. There is no evidence of pulmonic valve vegetation. Aorta: The aortic root and ascending aorta are structurally normal, with no evidence of dilitation. There is mild (Grade II) plaque involving the descending aorta. IAS/Shunts: No atrial level shunt detected by color flow Doppler.  AORTIC VALVE AI PHT:      156 msec Buford Dresser MD Electronically signed by Buford Dresser MD Signature Date/Time: 07/25/2019/11:26:33 AM    Final     Medications:   . metoprolol tartrate  5 mg Intravenous Once   Continuous Infusions: . HYDROmorphone 0.5 mg/hr (07/25/19 1634)     LOS: 5 days   Geradine Girt  [facility]   How to contact the [facility] Attending or Consulting provider Industry or covering provider during after hours [place], for this patient?  1. Check the care team in [facility] and look for a) attending/consulting TRH provider listed and b) the [facility] team listed 2. Log into [email or web address] and use [place]'s universal password to access. If you do not have the password, please contact the hospital operator. 3. Locate the [facility] provider you are looking for  under [facility] and page to a number that you can be directly reached. 4. If you still have difficulty reaching the provider, please page the [facility] (Director on Call) for the Hospitalists listed on amion for assistance.  07/26/2019, 10:29 AM

## 2019-07-26 NOTE — Progress Notes (Signed)
Peripherally Inserted Central Catheter Placement  The IV Nurse has discussed with the patient and/or persons authorized to consent for the patient, the purpose of this procedure and the potential benefits and risks involved with this procedure.  The benefits include less needle sticks, lab draws from the catheter, and the patient may be discharged home with the catheter. Risks include, but not limited to, infection, bleeding, blood clot (thrombus formation), and puncture of an artery; nerve damage and irregular heartbeat and possibility to perform a PICC exchange if needed/ordered by physician.  Alternatives to this procedure were also discussed.  Bard Power PICC patient education guide, fact sheet on infection prevention and patient information card has been provided to patient /or left at bedside.    PICC Placement Documentation  PICC Single Lumen 07/26/19 PICC Right Brachial 42 cm 1 cm (Active)  Indication for Insertion or Continuance of Line Prolonged intravenous therapies;Home intravenous therapies (PICC only) 07/26/19 1447  Exposed Catheter (cm) 1 cm 07/26/19 1447  Site Assessment Clean;Dry;Intact 07/26/19 1447  Line Status Flushed;Saline locked;Blood return noted 07/26/19 1447  Dressing Type Transparent;Securing device 07/26/19 1447  Dressing Status Clean;Dry;Intact;Antimicrobial disc in place 07/26/19 1447  Safety Lock Not Applicable [id number]  Dressing Intervention New dressing 07/26/19 1447  Dressing Change Due 08/02/19 07/26/19 [place], Cord[place] 07/26/2019, 2:48 PM

## 2019-07-27 DIAGNOSIS — R1084 Generalized abdominal pain: Secondary | ICD-10-CM

## 2019-07-27 LAB — CULTURE, BLOOD (ROUTINE X 2)
Culture: NO GROWTH
Culture: NO GROWTH
Special Requests: ADEQUATE
Special Requests: ADEQUATE

## 2019-07-27 LAB — GLUCOSE, CAPILLARY
Glucose-Capillary: 361 mg/dL — ABNORMAL HIGH (ref 70–99)
Glucose-Capillary: 334 mg/dL — ABNORMAL HIGH (ref 70–99)

## 2019-07-27 MED ORDER — HEPARIN SOD (PORK) LOCK FLUSH 100 UNIT/ML IV SOLN
250.0000 [IU] | INTRAVENOUS | Status: AC | PRN
  Administered 2019-07-27: 250 [IU]
  Filled 2019-07-27: qty 2.5

## 2019-07-27 MED ORDER — HYDROMORPHONE BOLUS VIA INFUSION: 0.2000 mg | INTRAVENOUS | 0 refills | Status: AC | PRN

## 2019-07-27 MED ORDER — SODIUM CHLORIDE 0.9 % IV SOLN: 0.5000 mg/h | INTRAVENOUS | Status: AC

## 2019-07-27 NOTE — Progress Notes (Signed)
Patient ID: Nathan Perkins, male   DOB: March 05, 1956, 64 y.o.   MRN: [id number]  This NP visited patient at the bedside as a follow up for palliative medicine needs and emotional support.  Plan is for home with hospice today.  Patient verbalizes a sense of peace with his decision for a comfort path at this time.  He does want to continue with his home medications and hopes for "alittle more time at home"      Currently his abdominal pain is controlled 5/10 on Dilaudid gtt.   Education offered on the natural trajectory and expectations at EOL. Questions and concerns addressed  Coordination with attending and hospice liaison for transition home and ongoing pain management .   Discussed with Dr Eliseo Squires  Total time spent on the unit was 20 minutes  Greater than 50% of the time was spent in counseling and coordination of care  Wadie Lessen NP  Palliative Medicine Team Team Phone # [phone] Pager [phone]

## 2019-07-27 NOTE — Plan of Care (Signed)
  Problem: Activity: Goal: Risk for activity intolerance will decrease Outcome: Progressing   Problem: Nutrition: Goal: Adequate nutrition will be maintained Outcome: Progressing   

## 2019-07-27 NOTE — Progress Notes (Signed)
DISCHARGE NOTE HOME [facility] ESH to be discharged to home hospice with Picc line per MD order. Patient requested just to give him half dose of his insulin for his blood sugar of 334 ,not just to bottom him down while going home via EMS. Prescriptions given to patient; medication list explained in detail. Patient verbalized understanding.Nurse and nurse student helped him to get dressed.  Skin clean, dry and intact without evidence of skin break down, no evidence of skin tears noted. IV catheter discontinued intact. Site without signs and symptoms of complications. Dressing and pressure applied. Pt denies pain at the site currently. No complaints noted.   An After Visit Summary (AVS) was printed and given to the patient. Patient discharged home hospice via EMS.  [place], Zenon Mayo, RN

## 2019-07-27 NOTE — Progress Notes (Signed)
Palliative Medicine RN Note: Spoke with Cassandra with [facility]. At her request, I faxed Dilaudid PCA orders to [facility] [phone]). She reports they will be able to admit today; they will need to have the PCA ready first, then they will need Mrs Strey to let them know what time they will be home.  Updated care team via secure chat.  Marjie Skiff Patti Shorb, RN, BSN, [facility] Palliative Medicine Team 07/27/2019 10:00 AM Office [phone]

## 2019-07-27 NOTE — Progress Notes (Signed)
Patient is being discharged to hospice after discussion with palliative care yesterday.  Please let vascular surgery know if we can be of assistance in the future.  Marty Heck, MD [facility] Office: [place]

## 2019-07-27 NOTE — Discharge Summary (Signed)
Physician Discharge Summary  Nathan Perkins [id number] DOB: [birth year] DOA: 07/20/2019  PCP: Monico Blitz, MD  Admit date: 07/20/2019 Discharge date: 07/27/2019  Admitted From: Home Discharge disposition: Home with hospice   Recommendations for Outpatient Follow-Up:   1. 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  Diet recommendation: Low sodium, heart healthy.  Carbohydrate-modified  Wound care: None.  Code status: DNR   History of Present Illness:   Nathan Perkins is a 64 y.o. 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  Patient states he has had progressive decline since hospitalization for Covid 19 pneumonia in October 2020.  At that time he was treated with IV steroids, remdesivir, Actemra, and convalescent plasma.  He developed post Covid ILD and has been using 2 L supplemental O2 via [place] as an outpatient.  He has had a 40 pound weight loss since that time.  On 05/05/2019 he had an MRI of his head for evaluation of headaches and arm numbness which showed suspected subacute small hemorrhagic infarct in the left occipital lobe.  Over the last several weeks he has been having intermittent severe epigastric to left upper quadrant abdominal pain.   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  He was advised to come to the ED for further evaluation.  Currently patient reports adequate pain control.  He says he has been having drenching night sweats but denies fevers or chills.  He denies any chest pain, dyspnea, cough, or swelling in his legs.  He denies any obvious bleeding.    Hospital Course by Problem:   SMA thromboembolism with splenic and renal infarcts/bacteremia/enterococcal endocarditis/AV endocarditis. -CVTS consult/vascular consult Status post TEE: Aortic valve endocarditis, with severe sequelae. Severe AI, all three leaflets thickened, cusp fenestrations present, and highly mobile >3.5 cm mass that moves between LVOT and Aorta with systole and diastole. High risk of embolism and likely source of septic emboli. Recommend CT surgery evaluation. -Per chart review it appears that patient had discussion with Dr. Marylyn Ishihara about becoming comfort care as he does not wish to undergo surgery -Dilaudid drip started 4/20 by Dr. Marylyn Ishihara -Palliative care consult appreciated and plan is to be discharged with hospice  Anemia. Iron deficiency anemia.  -Status post colonoscopy on 4/20.  -Revealing a couple polyps otherwise within the limits of normal.   Diabetes type II -Will resume home regimen  Chronic respiratory failure due to hypoxia secondary to post Covid interstitial lung disease.  -Patient is on home oxygen at 2 L.  -Currently oxygen saturation level greater than 90% on 2 L    Medical Consultants:  Vascular  CVTS  Palliative care  Cardiology for TEE  GI   Discharge Exam:   Vitals:   07/25/19 1508 07/25/19 1927  BP: (!) 172/80 140/85  Pulse:  (!) 115  Resp: (!) 30   Temp: 99.3 F (37.4 C) 98.8 F (37.1 C)   SpO2: (!) 87% 100%   Vitals:   07/25/19 1301 07/25/19 1459 07/25/19 1508 07/25/19 1927  BP:  (!) 177/87 (!) 172/80 140/85  Pulse:  (!) 137  (!) 115  Resp: 20 (!) 23 (!) 30   Temp:   99.3 F (37.4 C) 98.8 F (37.1 C)  TempSrc:   Oral Oral  SpO2: 94% 92% (!) 87% 100%  Weight:      Height:        General exam: Appears calm and comfortable.    The results of significant diagnostics from this hospitalization (including imaging, microbiology, ancillary and laboratory) are listed below for reference.     Procedures and Diagnostic Studies:   ECHOCARDIOGRAM COMPLETE  Result Date: 07/21/2019    ECHOCARDIOGRAM REPORT   Patient Name:   Nathan Perkins Date of Exam: 07/21/2019 Medical Rec #:  [id number]          Height:       72.0 in Accession #:    [id number]         Weight:       166.2 lb Date of Birth:  01/21/1956          BSA:          1.969 m Patient Age:    5 years           BP:           103/51 mmHg Patient Gender: M                  HR:           93 bpm. Exam Location:  Inpatient Procedure: 2D Echo Indications:    Superior mesenteric artery thrombosis ([place]) [id number]  History:        Patient has no prior history of Echocardiogram examinations.                 Risk Factors:Diabetes and Hypertension. COVID pneumonia October                 2020.  Sonographer:    Darlina Sicilian RDCS Referring Phys: [id number] [place]  1. Left ventricular ejection fraction, by estimation, is 60 to 65%. The left ventricle has mildly decreased function. The left ventricle has no regional wall motion abnormalities. Left ventricular diastolic parameters were normal.  2. Right ventricular systolic function is normal. The right ventricular size is normal.  3. Left atrial size was mild to moderately dilated.  4. The mitral valve is normal in structure. No evidence of mitral valve regurgitation. No evidence of mitral stenosis.  5. Possible vegetation on AV seen best on 3 chamber and PSL suggest f/u TEE to  further evaluate . The aortic valve is tricuspid. Aortic valve regurgitation is mild. Mild aortic valve sclerosis is present, with no evidence of aortic valve stenosis.  6. The inferior vena cava is normal in size with greater than 50% respiratory variability, suggesting right atrial pressure of 3 mmHg. FINDINGS  Left Ventricle: Left ventricular ejection fraction, by estimation, is 60 to 65%. The left ventricle has mildly decreased function. The left ventricle has no regional wall motion abnormalities. The left ventricular internal cavity size  was normal in size. There is no left ventricular hypertrophy. Left ventricular diastolic parameters were normal. Right Ventricle: The right ventricular size is normal. No increase in right ventricular wall thickness. Right ventricular systolic function is normal. Left Atrium: Left atrial size was mild to moderately dilated. Right Atrium: Right atrial size was normal in size. Pericardium: There is no evidence of pericardial effusion. Mitral Valve: The mitral valve is normal in structure. Normal mobility of the mitral valve leaflets. No evidence of mitral valve regurgitation. No evidence of mitral valve stenosis. Tricuspid Valve: The tricuspid valve is normal in structure. Tricuspid valve regurgitation is mild . No evidence of tricuspid stenosis. Aortic Valve: Possible vegetation on AV seen best on 3 chamber and PSL suggest f/u TEE to further evaluate. The aortic valve is tricuspid. Aortic valve regurgitation is mild. Aortic regurgitation PHT measures 195 msec. Mild aortic valve sclerosis is present, with no evidence of aortic valve stenosis. Pulmonic Valve: The pulmonic valve was normal in structure. Pulmonic valve regurgitation is not visualized. No evidence of pulmonic stenosis. Aorta: The aortic root is normal in size and structure. Venous: The inferior vena cava is normal in size with greater than 50% respiratory variability, suggesting right atrial pressure of 3 mmHg.  IAS/Shunts: No atrial level shunt detected by color flow Doppler.  LEFT VENTRICLE PLAX 2D LVIDd:         5.38 cm  Diastology LVIDs:         3.53 cm  LV e' lateral:   9.25 cm/s LV PW:         1.23 cm  LV E/e' lateral: 9.2 LV IVS:        1.23 cm  LV e' medial:    6.64 cm/s LVOT diam:     2.00 cm  LV E/e' medial:  12.9 LV SV:         75 LV SV Index:   38 LVOT Area:     3.14 cm  RIGHT VENTRICLE RV S prime:     23.40 cm/s TAPSE (M-mode): 2.1 cm LEFT ATRIUM           Index       RIGHT ATRIUM           Index LA diam:      4.30 cm 2.18 cm/m  RA Area:     11.50 cm LA Vol (A4C): 54.5 ml 27.67 ml/m RA Volume:   22.70 ml  11.53 ml/m  AORTIC VALVE LVOT Vmax:   132.00 cm/s LVOT Vmean:  77.300 cm/s LVOT VTI:    0.238 m AI PHT:      195 msec  AORTA Ao Root diam: 3.20 cm Ao Asc diam:  3.10 cm MITRAL VALVE MV Area (PHT): 6.07 cm    SHUNTS MV Decel Time: 125 msec    Systemic VTI:  0.24 m MV E velocity: 85.40 cm/s  Systemic Diam: 2.00 cm MV A velocity: 65.50 cm/s MV E/A ratio:  1.30 Jenkins Rouge MD Electronically signed by Jenkins Rouge MD Signature Date/Time: 07/21/2019/9:37:26 AM    Final      Labs:   Basic Metabolic Panel: Recent Labs  Lab 07/20/19 1448 07/20/19 1448 07/21/19 0236 07/21/19 0236 07/22/19 0230 07/25/19 0334  NA 132*  --  132*  --  130* 136  K 4.4   < > 4.8   < > 5.3* 3.7  CL 97*  --  98  --  96* 98  CO2 28  --  27  --  23  31  GLUCOSE 157*  --  241*  --  459* 110*  BUN 16  --  16  --  19 5*  CREATININE 0.97  --  1.03  --  0.97 0.78  CALCIUM 8.7*  --  8.3*  --  8.1* 8.5*  MG  --   --   --   --   --  1.8  PHOS  --   --   --   --   --  4.2   < > = values in this interval not displayed.   GFR Estimated Creatinine Clearance: 103.7 mL/min (by C-G formula based on SCr of 0.78 mg/dL). Liver Function Tests: Recent Labs  Lab 07/20/19 1448 07/25/19 0334  AST 15 19  ALT 10 12  ALKPHOS 47 54  BILITOT 0.8 0.4  PROT 7.0 6.1*  ALBUMIN 2.6* 2.1*   No results for input(s): LIPASE, AMYLASE in  the last 168 hours. No results for input(s): AMMONIA in the last 168 hours. Coagulation profile No results for input(s): INR, PROTIME in the last 168 hours.  CBC: Recent Labs  Lab 07/21/19 1641 07/22/19 0230 07/23/19 0524 07/24/19 0533 07/25/19 0334  WBC 5.0 5.5 4.6 6.8 10.5  HGB 8.0* 8.4* 8.4* 8.2* 8.7*  HCT 26.9* 28.7* 29.0* 28.2* 30.1*  MCV 69.7* 70.7* 70.4* 70.5* 71.0*  PLT 174 191 253 255 326   Cardiac Enzymes: No results for input(s): CKTOTAL, CKMB, CKMBINDEX, TROPONINI in the last 168 hours. BNP: Invalid input(s): POCBNP CBG: Recent Labs  Lab 07/25/19 2109 07/25/19 2322 07/26/19 1836 07/26/19 2136 07/27/19 0700  GLUCAP 229* 236* 397* 433* 361*   D-Dimer No results for input(s): DDIMER in the last 72 hours. Hgb A1c No results for input(s): HGBA1C in the last 72 hours. Lipid Profile No results for input(s): CHOL, HDL, LDLCALC, TRIG, CHOLHDL, LDLDIRECT in the last 72 hours. Thyroid function studies No results for input(s): TSH, T4TOTAL, T3FREE, THYROIDAB in the last 72 hours.  Invalid input(s): FREET3 Anemia work up No results for input(s): VITAMINB12, FOLATE, FERRITIN, TIBC, IRON, RETICCTPCT in the last 72 hours. Microbiology Recent Results (from the past 240 hour(s))  SARS CORONAVIRUS 2 (TAT 6-24 HRS) Nasopharyngeal Nasopharyngeal Swab     Status: None   Collection Time: 07/20/19  5:49 PM   Specimen: Nasopharyngeal Swab  Result Value Ref Range Status   SARS Coronavirus 2 NEGATIVE NEGATIVE Final    Comment: (NOTE) SARS-CoV-2 target nucleic acids are NOT DETECTED. The SARS-CoV-2 RNA is generally detectable in upper and lower respiratory specimens during the acute phase of infection. Negative results do not preclude SARS-CoV-2 infection, do not rule out co-infections with other pathogens, and should not be used as the sole basis for treatment or other patient management decisions. Negative results must be combined with clinical observations, patient  history, and epidemiological information. The expected result is Negative. Fact Sheet for Patients: SugarRoll.be Fact Sheet for Healthcare Providers: https://www.woods-mathews.com/ This test is not yet approved or cleared by the [place] FDA and  has been authorized for detection and/or diagnosis of SARS-CoV-2 by FDA under an Emergency Use Authorization (EUA). This EUA will remain  in effect (meaning this test can be used) for the duration of the COVID-19 declaration under Section 56 4(b)(1) of the Act, 21 U.S.C. section 360bbb-3(b)(1), unless the authorization is terminated or revoked sooner. Performed at [facility], [place] [address] [place], [place] [address]   Blood culture (routine x 2)     Status: None (Preliminary result)  Collection Time: 07/20/19  6:39 PM   Specimen: BLOOD  Result Value Ref Range Status   Specimen Description BLOOD LEFT ANTECUBITAL  Final   Special Requests   Final    BOTTLES DRAWN AEROBIC AND ANAEROBIC Blood Culture results may not be optimal due to an inadequate volume of blood received in culture bottles   Culture  Setup Time   Final    IN BOTH AEROBIC AND ANAEROBIC BOTTLES GRAM POSITIVE COCCI IN CHAINS CRITICAL VALUE NOTED.  VALUE IS CONSISTENT WITH PREVIOUSLY REPORTED AND CALLED VALUE.    Culture   Final    GRAM POSITIVE COCCI TOO YOUNG TO READ Performed at [facility], [place] [address], [place], [place] [address]    Report Status PENDING  Incomplete  Blood culture (routine x 2)     Status: Abnormal (Preliminary result)   Collection Time: 07/20/19  6:39 PM   Specimen: BLOOD  Result Value Ref Range Status   Specimen Description BLOOD RIGHT ANTECUBITAL  Final   Special Requests   Final    BOTTLES DRAWN AEROBIC AND ANAEROBIC Blood Culture results may not be optimal due to an inadequate volume of blood received in culture bottles   Culture  Setup Time   Final    GRAM POSITIVE COCCI IN  CHAINS IN BOTH AEROBIC AND ANAEROBIC BOTTLES Organism ID to follow CRITICAL RESULT CALLED TO, READ BACK BY AND VERIFIED WITH: [name] [id number] 07/21/19 A BROWNING    Culture (A)  Final    ENTEROCOCCUS FAECALIS SUSCEPTIBILITIES PERFORMED ON PREVIOUS CULTURE WITHIN THE LAST 5 DAYS. Performed at [facility], [place] [address] [place], [place] [address]    Report Status PENDING  Incomplete   Organism ID, Bacteria ENTEROCOCCUS FAECALIS  Final      Susceptibility   Enterococcus faecalis - MIC*    AMPICILLIN <=2 SENSITIVE Sensitive     VANCOMYCIN 1 SENSITIVE Sensitive     * ENTEROCOCCUS FAECALIS  Blood Culture ID Panel (Reflexed)     Status: Abnormal   Collection Time: 07/20/19  6:39 PM  Result Value Ref Range Status   Enterococcus species DETECTED (A) NOT DETECTED Final    Comment: CRITICAL RESULT CALLED TO, READ BACK BY AND VERIFIED WITH: [name] PHARMD [id number] 07/21/19 A BROWNING    Vancomycin resistance NOT DETECTED NOT DETECTED Final   Listeria monocytogenes NOT DETECTED NOT DETECTED Final   Staphylococcus species NOT DETECTED NOT DETECTED Final   Staphylococcus aureus (BCID) NOT DETECTED NOT DETECTED Final   Streptococcus species NOT DETECTED NOT DETECTED Final   Streptococcus agalactiae NOT DETECTED NOT DETECTED Final   Streptococcus pneumoniae NOT DETECTED NOT DETECTED Final   Streptococcus pyogenes NOT DETECTED NOT DETECTED Final   Acinetobacter baumannii NOT DETECTED NOT DETECTED Final   Enterobacteriaceae species NOT DETECTED NOT DETECTED Final   Enterobacter cloacae complex NOT DETECTED NOT DETECTED Final   Escherichia coli NOT DETECTED NOT DETECTED Final   Klebsiella oxytoca NOT DETECTED NOT DETECTED Final   Klebsiella pneumoniae NOT DETECTED NOT DETECTED Final   Proteus species NOT DETECTED NOT DETECTED Final   Serratia marcescens NOT DETECTED NOT DETECTED Final   Haemophilus influenzae NOT DETECTED NOT DETECTED Final   Neisseria meningitidis NOT DETECTED NOT  DETECTED Final   Pseudomonas aeruginosa NOT DETECTED NOT DETECTED Final   Candida albicans NOT DETECTED NOT DETECTED Final   Candida glabrata NOT DETECTED NOT DETECTED Final   Candida krusei NOT DETECTED NOT DETECTED Final   Candida parapsilosis NOT  DETECTED NOT DETECTED Final   Candida tropicalis NOT DETECTED NOT DETECTED Final    Comment: Performed at [facility], [place] [address] [place], [place] [address]  Culture, blood (routine x 2)     Status: None   Collection Time: 07/22/19 10:27 AM   Specimen: BLOOD  Result Value Ref Range Status   Specimen Description BLOOD RIGHT ANTECUBITAL  Final   Special Requests   Final    BOTTLES DRAWN AEROBIC AND ANAEROBIC Blood Culture adequate volume   Culture   Final    NO GROWTH 5 DAYS Performed at [facility], [place] [address], [place], [place] [address]    Report Status 07/27/2019 FINAL  Final  Culture, blood (routine x 2)     Status: None   Collection Time: 07/22/19 10:33 AM   Specimen: BLOOD RIGHT HAND  Result Value Ref Range Status   Specimen Description BLOOD RIGHT HAND  Final   Special Requests   Final    BOTTLES DRAWN AEROBIC AND ANAEROBIC Blood Culture adequate volume   Culture   Final    NO GROWTH 5 DAYS Performed at [facility], [place] [address] [place], [place] [address]    Report Status 07/27/2019 FINAL  Final     Discharge Instructions:    Allergies as of 07/27/2019      Reactions   Invokana [canagliflozin] Other (See Comments)   KETOACIDOSIS    Percocet [oxycodone-acetaminophen] Itching   Shellfish Allergy    Shrimp - possible Iodine allergy UNSPECIFIED REACTION    Sulfa Antibiotics    UNSPECIFIED REACTION       Medication List    STOP taking these medications   amLODipine 5 MG tablet Commonly known as: NORVASC   benzonatate 200 MG capsule Commonly known as: TESSALON   chlorpheniramine-HYDROcodone 10-8 MG/5ML Suer Commonly known as: Tussionex Pennkinetic ER   lisinopril 40 MG  tablet Commonly known as: ZESTRIL     TAKE these medications   aspirin EC 81 MG tablet Take 81 mg by mouth daily.   ferrous sulfate 325 (65 FE) MG tablet Take 325 mg by mouth daily with breakfast.   Flaxseed Oil 1200 MG Caps Take 1 capsule by mouth daily.   HumaLOG KwikPen 100 UNIT/ML KwikPen Generic drug: insulin lispro Inject 4-10 Units into the muscle as needed (Sliding scale).   HYDROmorphone 2 mg/mL Soln Commonly known as: DILAUDID Inject 0.2 mg into the vein every hour as needed (dyspnea).   HYDROmorphone 25 mg in sodium chloride 0.9 % 47.5 mL Inject 0.5 mg/hr into the vein continuous.   Insulin Syringe-Needle U-100 25G X 1" 1 ML Misc For 4 times a day insulin SQ, 1 month supply. Diagnosis E11.65 What changed:   how much to take  how to take this  when to take this   methocarbamol 500 MG tablet Commonly known as: ROBAXIN Take 500 mg by mouth 2 (two) times daily.   NovoLIN N ReliOn 100 UNIT/ML injection Generic drug: insulin NPH Human Inject 30-40 Units into the skin See admin instructions. Per sliding scale depends on blood sugar levels   ondansetron 4 MG tablet Commonly known as: ZOFRAN Take 4 mg by mouth every 8 (eight) hours as needed for nausea/vomiting.   pantoprazole 40 MG tablet Commonly known as: PROTONIX Take 40 mg by mouth daily.   Vitamin D (Ergocalciferol) 1.25 MG (50000 UNIT) Caps capsule Commonly known as: DRISDOL Take 1 capsule by mouth every Sunday.  Time coordinating discharge: 35 min  Signed:  Geradine Girt DO  [facility] 07/27/2019, 9:28 AM

## 2019-07-27 NOTE — TOC Transition Note (Signed)
Transition of Care [facility]) - CM/SW Discharge Note   Patient Details  Name: Nathan Perkins MRN: [id number] Date of Birth: [birth year]  Transition of Care [facility]) CM/SW Contact:  Bartholomew Crews, RN Phone Number: [phone] 07/27/2019, 11:29 AM   Clinical Narrative:     Patient to transition home with [facility] today. Verified receipt of infusion medication and readiness to deliver to the home with [facility]. Thank you palliative for your assistance and leg work with this matter. Spoke with patient at the bedside who advised that all DME had been delivered, and his home had been prepared for him. PTAR arrangements made. Bedside nurse aware and arranging PICC line to be disconnected with PTAR arrival. Patient to receive bolus of pain medication prior to PICC disconnect. Patient expressed appreciation for assistance with getting him home to his bride. No further TOC needs identified.   Final next level of care: Home w Hospice Care Barriers to Discharge: No Barriers Identified   Patient Goals and CMS Choice        Discharge Placement                Patient to be transferred to facility by: PTAR      Discharge Plan and Services                                     Social Determinants of Health (SDOH) Interventions     Readmission Risk Interventions No flowsheet data found.

## 2019-07-28 ENCOUNTER — Telehealth: Payer: Self-pay | Admitting: Pulmonary Disease

## 2019-07-28 DIAGNOSIS — J9611 Chronic respiratory failure with hypoxia: Secondary | ICD-10-CM

## 2019-07-28 DIAGNOSIS — R1084 Generalized abdominal pain: Secondary | ICD-10-CM

## 2019-07-28 NOTE — Telephone Encounter (Signed)
Called and spoke with pt's wife Gwen. Gwen stated that pt is now using oxygen which is provided by [facility] as they get their equipment and supplies from [employer].  Due to this, pt is needing to have his O2 supplies and equipment picked up that belongs to [facility].  Dr. Halford Chessman, are you okay signing off on order for this?

## 2019-07-28 NOTE — Telephone Encounter (Signed)
Nathan Perkins worked with [facility] today, and he does not have a particular nurse/CMA assigned to him.  Nathan Perkins please advise if this order has been faxed.  Thanks!

## 2019-07-28 NOTE — Telephone Encounter (Signed)
Order signed.

## 2019-07-28 NOTE — Telephone Encounter (Signed)
Order was electronically signed by VS today.

## 2019-08-02 LAB — CULTURE, BLOOD (ROUTINE X 2)

## 2019-08-15 ENCOUNTER — Telehealth: Payer: Self-pay | Admitting: Pulmonary Disease

## 2019-08-15 NOTE — Telephone Encounter (Signed)
Nathan Perkins put in order on 3/15 for CT Chest High Res to be done in 3-6 months.  I called pt to find out if wants CT in June or to wait a month or 2.  Wife states pt is now in Hospice care and will not be needing CT.

## 2019-08-15 NOTE — Telephone Encounter (Signed)
Noted.  Chesley Mires, MD [place] Pager - [phone] 08/15/2019, 3:10 PM

## 2019-08-15 NOTE — Telephone Encounter (Signed)
Okay noted.  Please update patient's records.  Please also forward message to Dr. Halford Chessman.  Aaron Edelman

## 2019-08-15 NOTE — Telephone Encounter (Signed)
Forwarding to ordering provider as FYI

## 2019-08-15 NOTE — Telephone Encounter (Signed)
Forwarding to VS as FYI.  

## 2019-08-21 ENCOUNTER — Ambulatory Visit: Payer: Commercial Managed Care - PPO | Admitting: Pulmonary Disease

## 2019-08-29 ENCOUNTER — Ambulatory Visit: Payer: Commercial Managed Care - PPO | Admitting: Pulmonary Disease

## 2019-09-05 DEATH — deceased

## 2020-06-20 IMAGING — CT CT ANGIO CHEST
3 of 5 series · 18 of 31 positions shown · IV contrast (OMNIPAQUE)
Comparison: Chest radiograph, most recent 01/26/2019.

CLINICAL DATA: Positive D-dimer.  PE suspected.

EXAM:
CT ANGIOGRAPHY CHEST WITH CONTRAST
TECHNIQUE: Multidetector CT imaging of the chest was performed using the
standard protocol during bolus administration of intravenous
contrast. Multiplanar CT image reconstructions and MIPs were
obtained to evaluate the vascular anatomy.
CONTRAST:  100mL OMNIPAQUE IOHEXOL 350 MG/ML SOLN

[Series 4: pe chest · axial · 0.79mm/px · z∈[+303,+543]mm · 8 of 160 slices shown]
[im 20/160  lung]
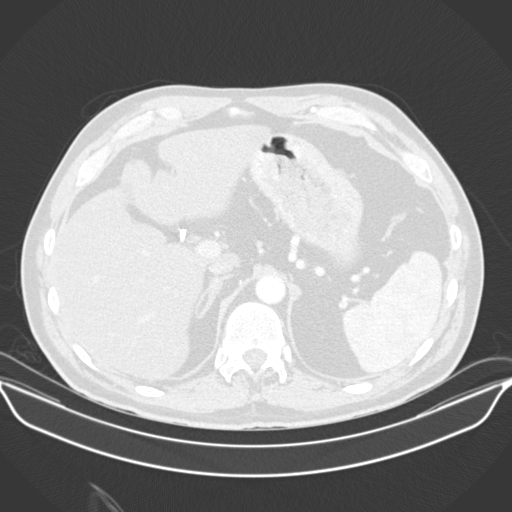
[im 40/160  mediastinal]
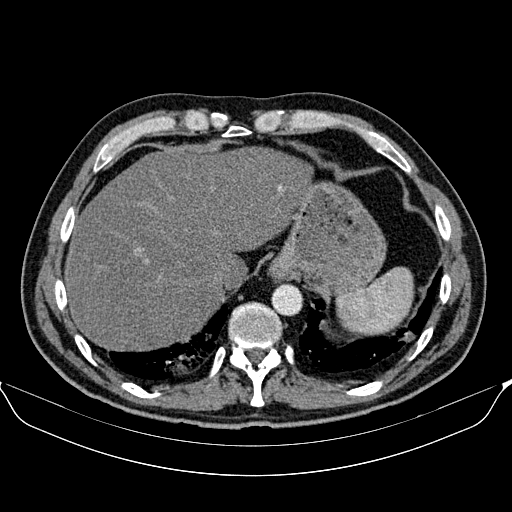
[im 60/160  lung]
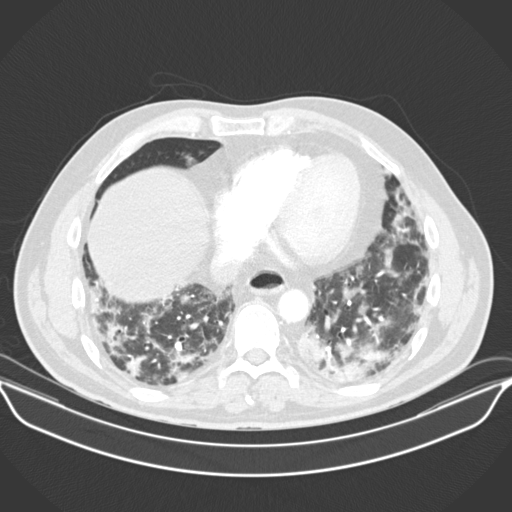
[im 76/160  mediastinal]
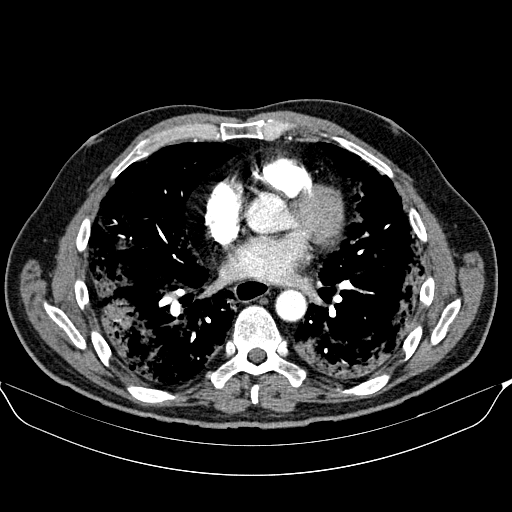
[im 80/160  lung]
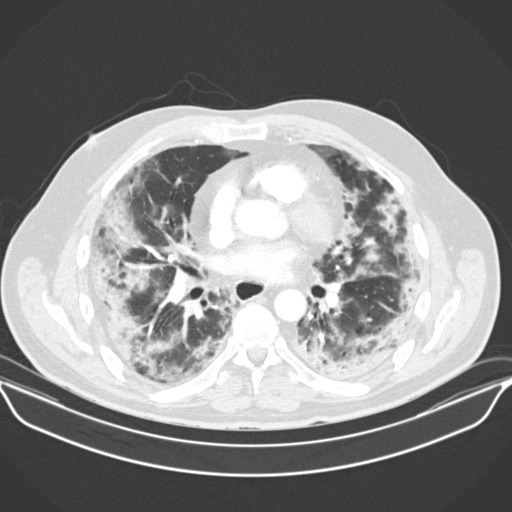
[im 100/160  mediastinal]
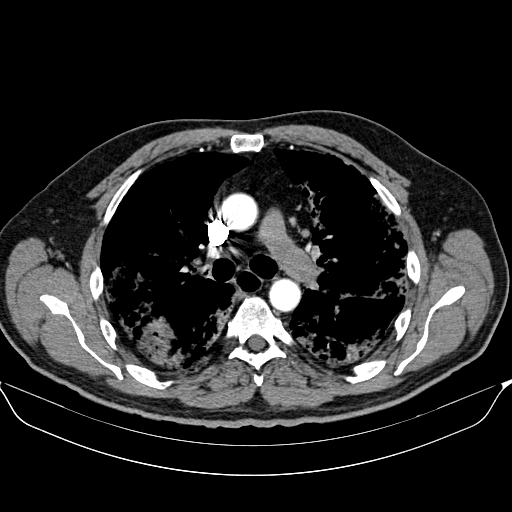
[im 120/160  lung]
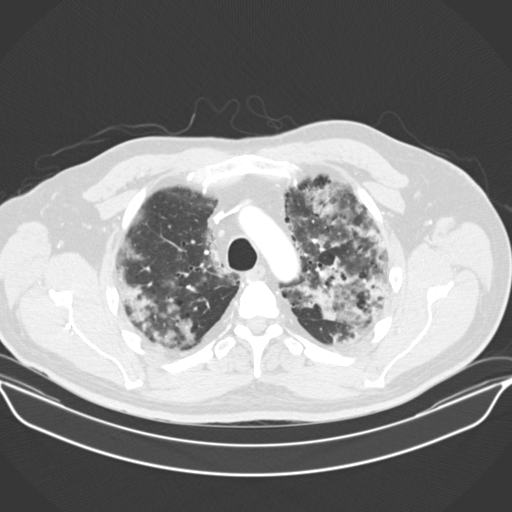
[im 140/160  mediastinal]
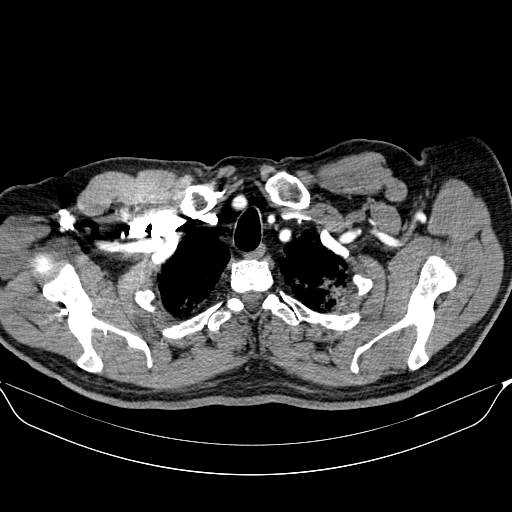

[Series 5: (person_name) thins · axial · 0.79mm/px · z∈[+303,+543]mm · 8 of 160 slices shown]
[im 20/160  lung]
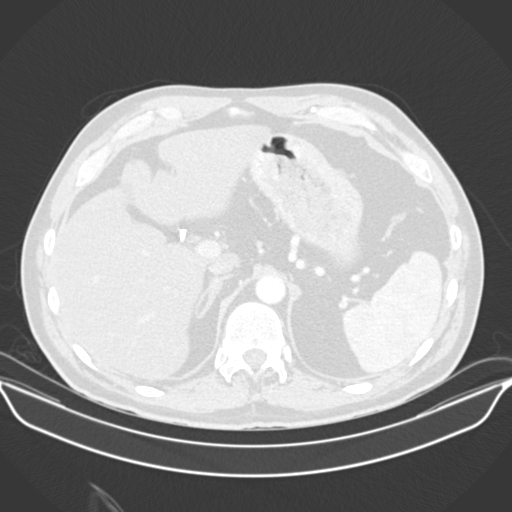
[im 40/160  lung]
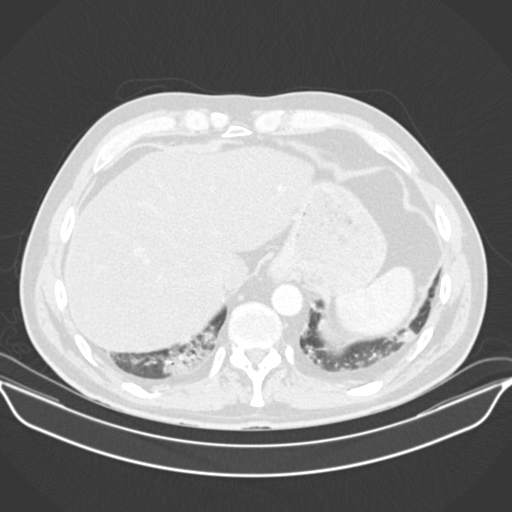
[im 60/160  lung]
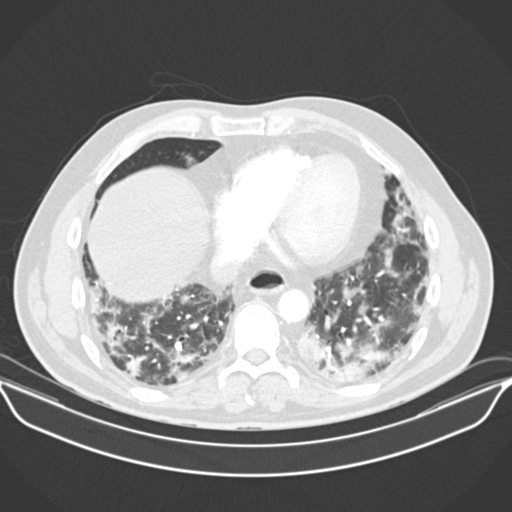
[im 76/160  lung]
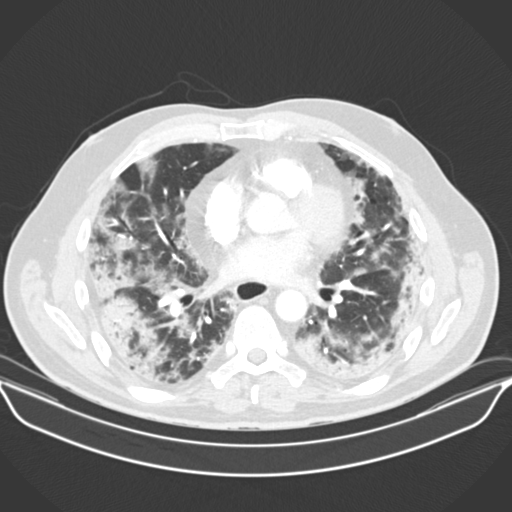
[im 80/160  lung]
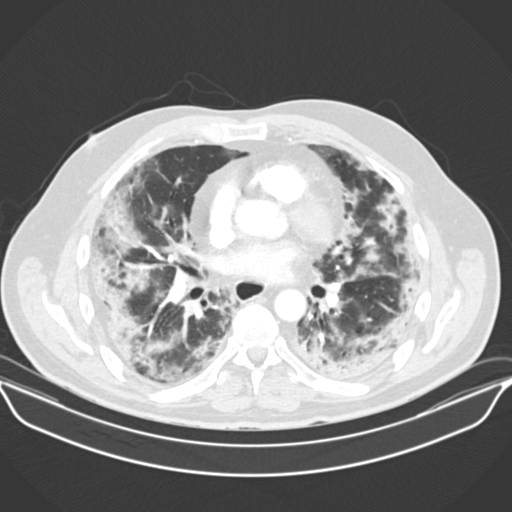
[im 100/160  lung]
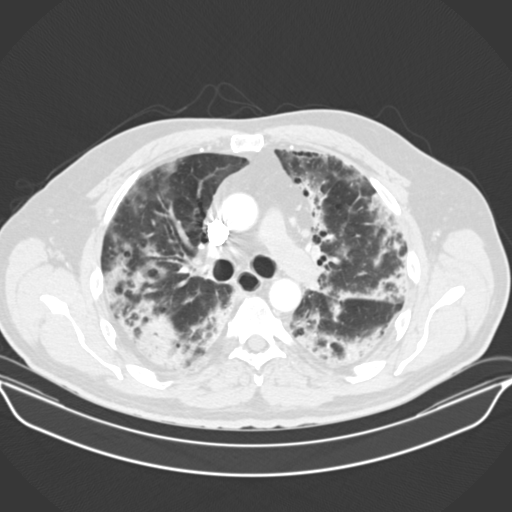
[im 120/160  lung]
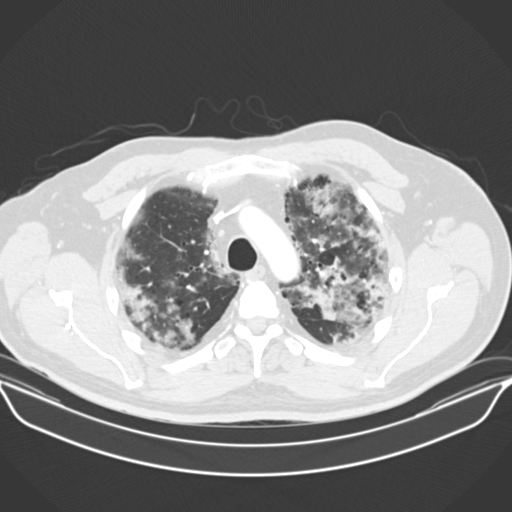
[im 140/160  lung]
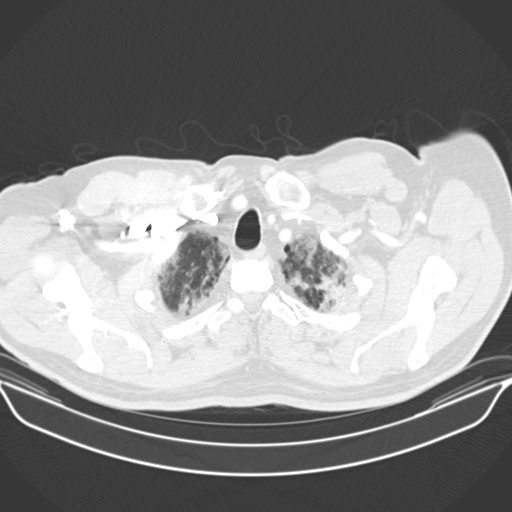

[Series 6: lung · axial · 0.79mm/px · z∈[+341,+381]mm · 2 of 142 slices shown]
[im 21/142  mediastinal]
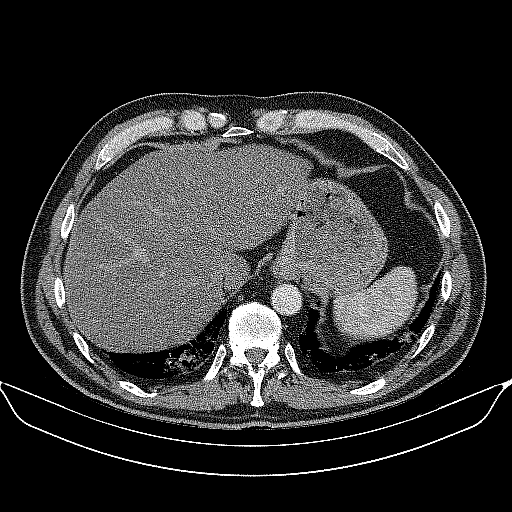
[im 41/142  mediastinal]
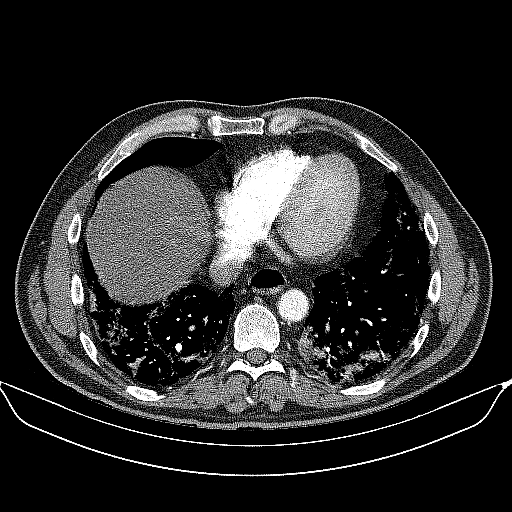

[18 of 31 positions shown; findings below may reference images not displayed]

FINDINGS: Cardiovascular: There is suboptimal opacification of the central
pulmonary arteries and the mid to upper lung pulmonary arteries.
There is dense opacification of pulmonary arteries to the lower
lobes and right middle lobe and portions of the left upper lobe
lingula. Where the pulmonary arteries are well opacified, there is
no evidence of a pulmonary embolism. No convincing central pulmonary
embolus. Upper lobe pulmonary emboli cannot be excluded.

Heart is normal in size and configuration. No pericardial effusion.
No coronary artery calcifications. Great vessels are normal in
caliber. No aortic dissection or atherosclerosis.

Mediastinum/Nodes: No neck base, axillary, mediastinal or hilar
masses or enlarged lymph nodes. Trachea is widely patent. Esophagus
mildly distended with air but otherwise unremarkable.

Lungs/Pleura: Patchy bilateral airspace lung opacities some
ground-glass and others more confluent, seen throughout both lungs.
Small cystic spaces are also noted primarily in the upper lobes.
There is also mild upper lobe paraseptal emphysema.

No pleural effusion.  No pneumothorax.

Upper Abdomen: No acute abnormality.

Musculoskeletal: No chest wall abnormality. No acute or significant
osseous findings.

Review of the MIP images confirms the above findings.
IMPRESSION: 1. Suboptimal study. Contrast opacifies the lower lung pulmonary
arteries, but there is no opacification in the upper lung pulmonary
arteries in limited central pulmonary artery opacification. Where
the arteries are opacified, there is no evidence of a pulmonary
embolism.
2. Bilateral confluent and ground-glass airspace lung opacities with
a peripheral distribution, consistent with multifocal pneumonia.
Patient has had a positive coronavirus test. The pattern of
pneumonia is consistent with coronavirus infection.
3. Mild underlying centrilobular and paraseptal emphysema.
